# Patient Record
Sex: Female | Born: 1945 | Race: White | Hispanic: No | Smoking: Former smoker
Health system: Southern US, Community
[De-identification: ages and names within clinical notes are randomized; demographics above are authoritative.]

## PROBLEM LIST (undated history)

## (undated) DIAGNOSIS — I803 Phlebitis and thrombophlebitis of lower extremities, unspecified: Secondary | ICD-10-CM

## (undated) DIAGNOSIS — R7303 Prediabetes: Secondary | ICD-10-CM

## (undated) DIAGNOSIS — C50919 Malignant neoplasm of unspecified site of unspecified female breast: Secondary | ICD-10-CM

## (undated) DIAGNOSIS — E119 Type 2 diabetes mellitus without complications: Secondary | ICD-10-CM

## (undated) DIAGNOSIS — F32A Depression, unspecified: Secondary | ICD-10-CM

## (undated) DIAGNOSIS — L570 Actinic keratosis: Secondary | ICD-10-CM

## (undated) DIAGNOSIS — R490 Dysphonia: Secondary | ICD-10-CM

## (undated) DIAGNOSIS — Q892 Congenital malformations of other endocrine glands: Secondary | ICD-10-CM

## (undated) DIAGNOSIS — E785 Hyperlipidemia, unspecified: Secondary | ICD-10-CM

## (undated) DIAGNOSIS — H6692 Otitis media, unspecified, left ear: Secondary | ICD-10-CM

## (undated) DIAGNOSIS — F329 Major depressive disorder, single episode, unspecified: Secondary | ICD-10-CM

## (undated) DIAGNOSIS — I2699 Other pulmonary embolism without acute cor pulmonale: Secondary | ICD-10-CM

## (undated) DIAGNOSIS — K219 Gastro-esophageal reflux disease without esophagitis: Secondary | ICD-10-CM

## (undated) HISTORY — DX: Actinic keratosis: L57.0

## (undated) HISTORY — DX: Hyperlipidemia, unspecified: E78.5

## (undated) HISTORY — DX: Malignant neoplasm of unspecified site of unspecified female breast: C50.919

## (undated) HISTORY — DX: Depression, unspecified: F32.A

## (undated) HISTORY — DX: Dysphonia: R49.0

## (undated) HISTORY — DX: Phlebitis and thrombophlebitis of lower extremities, unspecified: I80.3

## (undated) HISTORY — DX: Major depressive disorder, single episode, unspecified: F32.9

## (undated) HISTORY — DX: Congenital malformations of other endocrine glands: Q89.2

## (undated) HISTORY — DX: Gastro-esophageal reflux disease without esophagitis: K21.9

## (undated) HISTORY — DX: Prediabetes: R73.03

## (undated) HISTORY — DX: Other pulmonary embolism without acute cor pulmonale: I26.99

---
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
## 1994-01-15 HISTORY — PX: CHOLECYSTECTOMY: SHX55

## 1999-01-16 HISTORY — PX: FRACTURE SURGERY: SHX138

## 2001-07-29 LAB — HM COLONOSCOPY: HM Colonoscopy: NORMAL

## 2008-07-29 LAB — HM MAMMOGRAPHY: HM Mammogram: ABNORMAL

## 2009-10-19 ENCOUNTER — Emergency Department: Payer: Self-pay | Admitting: Internal Medicine

## 2011-11-26 DIAGNOSIS — Z23 Encounter for immunization: Secondary | ICD-10-CM | POA: Diagnosis not present

## 2012-04-02 DIAGNOSIS — H43399 Other vitreous opacities, unspecified eye: Secondary | ICD-10-CM | POA: Diagnosis not present

## 2012-07-29 ENCOUNTER — Encounter: Payer: Self-pay | Admitting: Internal Medicine

## 2012-07-29 ENCOUNTER — Ambulatory Visit (INDEPENDENT_AMBULATORY_CARE_PROVIDER_SITE_OTHER): Payer: Medicare Other | Admitting: Internal Medicine

## 2012-07-29 VITALS — BP 118/70 | HR 89 | Temp 98.6°F | Resp 12 | Ht 72.0 in | Wt 245.5 lb

## 2012-07-29 DIAGNOSIS — Z87898 Personal history of other specified conditions: Secondary | ICD-10-CM

## 2012-07-29 DIAGNOSIS — R5381 Other malaise: Secondary | ICD-10-CM | POA: Diagnosis not present

## 2012-07-29 DIAGNOSIS — E559 Vitamin D deficiency, unspecified: Secondary | ICD-10-CM

## 2012-07-29 DIAGNOSIS — Z1239 Encounter for other screening for malignant neoplasm of breast: Secondary | ICD-10-CM

## 2012-07-29 DIAGNOSIS — E785 Hyperlipidemia, unspecified: Secondary | ICD-10-CM | POA: Diagnosis not present

## 2012-07-29 DIAGNOSIS — F4321 Adjustment disorder with depressed mood: Secondary | ICD-10-CM

## 2012-07-29 DIAGNOSIS — E669 Obesity, unspecified: Secondary | ICD-10-CM

## 2012-07-29 DIAGNOSIS — R5383 Other fatigue: Secondary | ICD-10-CM | POA: Diagnosis not present

## 2012-07-29 DIAGNOSIS — Z1211 Encounter for screening for malignant neoplasm of colon: Secondary | ICD-10-CM

## 2012-07-29 DIAGNOSIS — Z1231 Encounter for screening mammogram for malignant neoplasm of breast: Secondary | ICD-10-CM | POA: Diagnosis not present

## 2012-07-29 DIAGNOSIS — Z808 Family history of malignant neoplasm of other organs or systems: Secondary | ICD-10-CM

## 2012-07-29 DIAGNOSIS — K219 Gastro-esophageal reflux disease without esophagitis: Secondary | ICD-10-CM

## 2012-07-29 DIAGNOSIS — F329 Major depressive disorder, single episode, unspecified: Secondary | ICD-10-CM

## 2012-07-29 DIAGNOSIS — Z9189 Other specified personal risk factors, not elsewhere classified: Secondary | ICD-10-CM

## 2012-07-29 MED ORDER — ZOLPIDEM TARTRATE 5 MG PO TABS: 5.0000 mg | ORAL_TABLET | Freq: Every evening | ORAL | Status: DC | PRN

## 2012-07-29 MED ORDER — SERTRALINE HCL 50 MG PO TABS: 50.0000 mg | ORAL_TABLET | Freq: Every day | ORAL | Status: DC

## 2012-07-29 NOTE — Patient Instructions (Addendum)
Trial of ambien to help you rest  Use as needed   return for fasting labs at your leisure

## 2012-07-29 NOTE — Progress Notes (Signed)
Patient ID: Dana Burke, female   DOB: 13-Oct-1945, 67 y.o.   MRN: [id number]   Patient Active Problem List   Diagnosis Date Noted  . Obesity, unspecified 07/31/2012  . History of abnormal mammogram 07/31/2012  . Grief 07/31/2012  . Family history of malignant melanoma 07/31/2012  . GERD (gastroesophageal reflux disease)   . Depression     Subjective:  CC:   Chief Complaint  Patient presents with  . Establish Care    HPI:   Dana Burke is a 67 y.o. female who presents as a new patient to establish primary care with the chief complaint of  Grief.  She is recently widowed. Husband Dana Burke die progressive MS and died at home in Hospice care two weeks ago.  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  He had been bed bound and wheelchair bound most of that time.  She has neglected her own health in the interim.  Obesity :  She is behind on all screenings and  has gained at least 30 lbs. "This is the heaviest I have ever been."   She has started a walking program this week.   History of recurrent DVTS post left Femur fracture years ago,  Retired Surveyor, mining .  GERD controlled with prilosec,  Recent flare  Uses a wedge   Needs mammogram, she has a  History of a right breast density that was followed with prior diagnostic mammograms nad and MRI of the breast.  No biopsy.  Last imagine 3 years ago.Sister had Breast CA Her last PAP smear  Was over 3 years ago as well.  She is requesting a dermatology referral for annual skin checks due to a strong FH of melanoma and basal cell Carcinomas  history of chest pain a few years ago.  Was observed in ED for 6 hours and released after cardiac enzyemes were negative x 2.  Pain was attributed to a Pulled muscle caused by straining to lift husband. No further workup done.     Past Medical History  Diagnosis Date  . Phlebitis and thrombophlebitis of the leg   . Pulmonary embolism   . GERD  (gastroesophageal reflux disease)   . Depression     recent events    Past Surgical History  Procedure Laterality Date  . Appendectomy  1963  . Cholecystectomy  1996  . Fracture surgery  2001    left femur    Family History  Problem Relation Age of Onset  . Heart disease Mother   . Hyperlipidemia Mother   . Heart disease Father   . Hyperlipidemia Father   . Diabetes Father   . Cancer Sister     melanoma  . Cancer Brother     skin cancer  . Cancer Sister     breast    History   Social History  . Marital Status: Widowed    Spouse Name: N/A    Number of Children: N/A  . Years of Education: N/A   Occupational History  . Not on file.   Social History Main Topics  . Smoking status: Former Smoker -- 0.40 packs/day    Types: Cigarettes    Quit date: 07/30/1987  . Smokeless tobacco: Never Used  . Alcohol Use: No  . Drug Use: No  . Sexually Active: No   Other Topics Concern  . Not on file   Social History Narrative  .  No narrative on file       @ALLHX @    Review of Systems:   The remainder of the review of systems was negative except those addressed in the HPI.       Objective:  BP 118/70  Pulse 89  Temp(Src) 98.6 F (37 C) (Oral)  Resp 12  Ht 6' (1.829 m)  Wt 245 lb 8 oz (111.358 kg)  BMI 33.29 kg/m2  SpO2 95%  General appearance: alert, cooperative and appears stated age Ears: normal TM's and external ear canals both ears Throat: lips, mucosa, and tongue normal; teeth and gums normal Neck: no adenopathy, no carotid bruit, supple, symmetrical, trachea midline and thyroid not enlarged, symmetric, no tenderness/mass/nodules Back: symmetric, no curvature. ROM normal. No CVA tenderness. Lungs: clear to auscultation bilaterally Heart: regular rate and rhythm, S1, S2 normal, no murmur, click, rub or gallop Abdomen: soft, non-tender; bowel sounds normal; no masses,  no organomegaly Pulses: 2+ and symmetric Skin: Skin color, texture, turgor  normal. No rashes or lesions Lymph nodes: Cervical, supraclavicular, and axillary nodes normal.  Assessment and Plan:  Depression She would like to resume sertraline  But is having trouble sleeping since th recetn death of husband.  Has tolerated ambien in the past.  Rx given, risks outlilned.   GERD (gastroesophageal reflux disease) Mild, managed with prn use of OTC PPIs.  Reviewed behavioral modifications including avoidance of mint, chocolate and alcohol and overeating.  Advised to remain upright for at least 2 hours after eating.    Obesity, unspecified Secondary to self neglect for several years in caring for dying husband. I have addressed  BMI and recommended a low glycemic index diet utilizing smaller more frequent meals to increase metabolism.  I have also recommended that patient start exercising with a goal of 30 minutes of aerobic exercise a minimum of 5 days per week. Screening for lipid disorders, thyroid and diabetes to be done today.    History of abnormal mammogram Her last mammogram was 3 yrs ago and abnormality on right was being followed serially without prior biopsy.  Sending to [facility]  Breast center.   Grief She is handling the loss of her husband well,  Has an excellent support system.  Hospice grief counselling available.   Family history of malignant melanoma Referral to dermatology for annual surveillance of skin lesions/    Updated Medication List Outpatient Encounter Prescriptions as of 07/29/2012  Medication Sig Dispense Refill  . aspirin 81 MG tablet Take 81 mg by mouth daily.      . calcium carbonate (OS-CAL) 600 MG TABS Take 600 mg by mouth 2 (two) times daily with a meal.      . Cholecalciferol (VITAMIN D-3) 1000 UNITS CAPS Take 1 capsule by mouth daily.      . Ibuprofen-Diphenhydramine HCl (ADVIL PM) 200-25 MG CAPS Take 2 capsules by mouth at bedtime.      . Multiple Vitamins-Minerals (MULTIVITAMIN WITH MINERALS) tablet Take 1 tablet by mouth daily.       [name] omeprazole (PRILOSEC) 20 MG capsule Take 20 mg by mouth daily.      . sertraline (ZOLOFT) 50 MG tablet Take 1 tablet (50 mg total) by mouth daily.  90 tablet  3  . zolpidem (AMBIEN) 5 MG tablet Take 1 tablet (5 mg total) by mouth at bedtime as needed for sleep.  30 tablet  1  . [DISCONTINUED] sertraline (ZOLOFT) 50 MG tablet Take 50 mg by mouth daily.  No facility-administered encounter medications on file as of 07/29/2012.     Orders Placed This Encounter  Procedures  . MM Digital Screening  . HM MAMMOGRAPHY  . CBC with Differential  . Comprehensive metabolic panel  . TSH  . Lipid panel  . Vitamin D 25 hydroxy  . Ambulatory referral to General Surgery  . Ambulatory referral to Dermatology  . HM COLONOSCOPY    No Follow-up on file.

## 2012-07-31 ENCOUNTER — Encounter: Payer: Self-pay | Admitting: Internal Medicine

## 2012-07-31 ENCOUNTER — Encounter: Payer: Self-pay | Admitting: Emergency Medicine

## 2012-07-31 DIAGNOSIS — F329 Major depressive disorder, single episode, unspecified: Secondary | ICD-10-CM | POA: Insufficient documentation

## 2012-07-31 DIAGNOSIS — F4321 Adjustment disorder with depressed mood: Secondary | ICD-10-CM | POA: Insufficient documentation

## 2012-07-31 DIAGNOSIS — Z808 Family history of malignant neoplasm of other organs or systems: Secondary | ICD-10-CM | POA: Insufficient documentation

## 2012-07-31 DIAGNOSIS — Z87898 Personal history of other specified conditions: Secondary | ICD-10-CM | POA: Insufficient documentation

## 2012-07-31 DIAGNOSIS — Z809 Family history of malignant neoplasm, unspecified: Secondary | ICD-10-CM | POA: Insufficient documentation

## 2012-07-31 DIAGNOSIS — K219 Gastro-esophageal reflux disease without esophagitis: Secondary | ICD-10-CM | POA: Insufficient documentation

## 2012-07-31 DIAGNOSIS — E669 Obesity, unspecified: Secondary | ICD-10-CM | POA: Insufficient documentation

## 2012-07-31 NOTE — Assessment & Plan Note (Addendum)
She would like to resume sertraline  But is having trouble sleeping since th recetn death of husband.  Has tolerated ambien in the past.  Rx given, risks outlilned.

## 2012-07-31 NOTE — Assessment & Plan Note (Signed)
Her last mammogram was 3 yrs ago and abnormality on right was being followed serially without prior biopsy.  Sending to [facility]  Breast center.

## 2012-07-31 NOTE — Assessment & Plan Note (Signed)
Referral to dermatology for annual surveillance of skin lesions/

## 2012-07-31 NOTE — Assessment & Plan Note (Signed)
Secondary to self neglect for several years in caring for dying husband. I have addressed  BMI and recommended a low glycemic index diet utilizing smaller more frequent meals to increase metabolism.  I have also recommended that patient start exercising with a goal of 30 minutes of aerobic exercise a minimum of 5 days per week. Screening for lipid disorders, thyroid and diabetes to be done today.

## 2012-07-31 NOTE — Assessment & Plan Note (Signed)
She is handling the loss of her husband well,  Has an excellent support system.  Hospice grief counselling available.

## 2012-07-31 NOTE — Assessment & Plan Note (Addendum)
Mild, managed with prn use of OTC PPIs.  Reviewed behavioral modifications including avoidance of mint, chocolate and alcohol and overeating.  Advised to remain upright for at least 2 hours after eating.

## 2012-08-01 ENCOUNTER — Other Ambulatory Visit (INDEPENDENT_AMBULATORY_CARE_PROVIDER_SITE_OTHER): Payer: Medicare Other

## 2012-08-01 DIAGNOSIS — R5381 Other malaise: Secondary | ICD-10-CM | POA: Diagnosis not present

## 2012-08-01 DIAGNOSIS — E785 Hyperlipidemia, unspecified: Secondary | ICD-10-CM | POA: Diagnosis not present

## 2012-08-01 DIAGNOSIS — R5383 Other fatigue: Secondary | ICD-10-CM | POA: Diagnosis not present

## 2012-08-01 DIAGNOSIS — E559 Vitamin D deficiency, unspecified: Secondary | ICD-10-CM | POA: Diagnosis not present

## 2012-08-01 LAB — COMPREHENSIVE METABOLIC PANEL
ALT: 19 U/L (ref 0–35)
AST: 18 U/L (ref 0–37)
Calcium: 9.9 mg/dL (ref 8.4–10.5)
Chloride: 107 mEq/L (ref 96–112)
Creatinine, Ser: 0.7 mg/dL (ref 0.4–1.2)
Potassium: 4.4 mEq/L (ref 3.5–5.1)

## 2012-08-01 LAB — CBC WITH DIFFERENTIAL/PLATELET
Basophils Absolute: 0 10*3/uL (ref 0.0–0.1)
Eosinophils Absolute: 0.1 10*3/uL (ref 0.0–0.7)
Lymphocytes Relative: 33.5 % (ref 12.0–46.0)
MCHC: 34.1 g/dL (ref 30.0–36.0)
Neutrophils Relative %: 58.8 % (ref 43.0–77.0)
RDW: 13.3 % (ref 11.5–14.6)

## 2012-08-01 LAB — LIPID PANEL
Cholesterol: 175 mg/dL (ref 0–200)
HDL: 34.2 mg/dL — ABNORMAL LOW (ref >39.00)
Total CHOL/HDL Ratio: 5
Triglycerides: 81 mg/dL (ref 0.0–149.0)

## 2012-08-04 ENCOUNTER — Encounter: Payer: Self-pay | Admitting: *Deleted

## 2012-08-07 ENCOUNTER — Encounter: Payer: Self-pay | Admitting: Emergency Medicine

## 2012-08-07 ENCOUNTER — Encounter: Payer: Self-pay | Admitting: *Deleted

## 2012-08-11 DIAGNOSIS — Z1231 Encounter for screening mammogram for malignant neoplasm of breast: Secondary | ICD-10-CM | POA: Diagnosis not present

## 2012-08-13 ENCOUNTER — Telehealth: Payer: Self-pay | Admitting: Internal Medicine

## 2012-08-13 NOTE — Telephone Encounter (Signed)
Left voicemail for patient to return call to office. 

## 2012-08-13 NOTE — Telephone Encounter (Signed)
Her mammogram was normal appearing but radiologist is withholding final reading until he can obtain her prior studies from [facility].

## 2012-08-15 NOTE — Telephone Encounter (Signed)
Patient notified

## 2012-08-18 ENCOUNTER — Encounter: Payer: Self-pay | Admitting: Internal Medicine

## 2012-09-04 ENCOUNTER — Encounter: Payer: Self-pay | Admitting: General Surgery

## 2012-09-04 ENCOUNTER — Ambulatory Visit (INDEPENDENT_AMBULATORY_CARE_PROVIDER_SITE_OTHER): Payer: Medicare Other | Admitting: General Surgery

## 2012-09-04 VITALS — BP 130/74 | HR 82 | Resp 16 | Ht 72.0 in | Wt 247.0 lb

## 2012-09-04 DIAGNOSIS — Z1211 Encounter for screening for malignant neoplasm of colon: Secondary | ICD-10-CM

## 2012-09-04 DIAGNOSIS — K219 Gastro-esophageal reflux disease without esophagitis: Secondary | ICD-10-CM | POA: Diagnosis not present

## 2012-09-04 MED ORDER — POLYETHYLENE GLYCOL 3350 17 GM/SCOOP PO POWD: ORAL | Status: DC

## 2012-09-04 NOTE — Progress Notes (Signed)
Patient ID: Dana Burke, female   DOB: 1945-05-14, 67 y.o.   MRN: [id number]  Chief Complaint  Patient presents with  . Other    evaluation for screening colonoscopy    HPI Dana Burke is a 67 y.o. female who presents for an evaluation of a screening colonoscopy. The patient has had one colonoscopy in the past approximately 12 years ago. She believes that colonoscopy was normal with possible diverticulitis. She denies any problems with her bowels at this time. She has a family history of a paternal uncle who passed away with colon cancer.  The patient has been making use of omeprazole for a long period of time. She notices significant recurrent symptoms if she misses a dose. No dysphasia. No weight loss.  HPI  Past Medical History  Diagnosis Date  . Phlebitis and thrombophlebitis of the leg   . Pulmonary embolism   . GERD (gastroesophageal reflux disease)   . Depression     recent events    Past Surgical History  Procedure Laterality Date  . Appendectomy  1963  . Cholecystectomy  1996  . Fracture surgery  2001    left tibia    Family History  Problem Relation Age of Onset  . Heart disease Mother   . Hyperlipidemia Mother   . Heart disease Father   . Hyperlipidemia Father   . Diabetes Father   . Cancer Sister     melanoma  . Cancer Brother     skin cancer  . Cancer Sister     breast  . Cancer Paternal Uncle     colon    Social History History  Substance Use Topics  . Smoking status: Former Smoker -- 0.40 packs/day    Types: Cigarettes    Quit date: 07/30/1987  . Smokeless tobacco: Never Used  . Alcohol Use: No    No Known Allergies  Current Outpatient Prescriptions  Medication Sig Dispense Refill  . aspirin 81 MG tablet Take 81 mg by mouth daily.      . calcium carbonate (OS-CAL) 600 MG TABS Take 600 mg by mouth 2 (two) times daily with a meal.      . Cholecalciferol (VITAMIN D-3) 1000 UNITS CAPS Take 1 capsule by mouth daily.      .  Ibuprofen-Diphenhydramine HCl (ADVIL PM) 200-25 MG CAPS Take 2 capsules by mouth at bedtime.      . Multiple Vitamins-Minerals (MULTIVITAMIN WITH MINERALS) tablet Take 1 tablet by mouth daily.      [name] omeprazole (PRILOSEC) 20 MG capsule Take 20 mg by mouth daily.      . sertraline (ZOLOFT) 50 MG tablet Take 1 tablet (50 mg total) by mouth daily.  90 tablet  3  . zolpidem (AMBIEN) 5 MG tablet Take 1 tablet (5 mg total) by mouth at bedtime as needed for sleep.  30 tablet  1  . polyethylene glycol powder (GLYCOLAX/MIRALAX) powder 255 grams one bottle for colonoscopy prep  255 g  0   No current facility-administered medications for this visit.    Review of Systems Review of Systems  Constitutional: Negative.   Respiratory: Negative.   Cardiovascular: Negative.   Gastrointestinal: Negative.     Blood pressure 130/74, pulse 82, resp. rate 16, height 6' (1.829 m), weight 247 lb (112.038 kg).  Physical Exam Physical Exam  Constitutional: She is oriented to person, place, and time. She appears well-developed and well-nourished.  Neck: No thyromegaly present.  Cardiovascular: Normal rate, regular rhythm and normal heart sounds.  No murmur heard. Pulmonary/Chest: Effort normal and breath sounds normal.  Lymphadenopathy:    She has no cervical adenopathy.  Neurological: She is alert and oriented to person, place, and time.  Skin: Skin is warm and dry.    Data Reviewed EMR data.  Assessment    Candidate for upper and lower endoscopy.     Plan    Upper endoscopy recommended because of her long history of reflux and the possibility of Barrett's epiphyseal change. Colonoscopy is recommended as a screening procedure.     Patient has been scheduled for a upper endoscopy and colonoscopy on 10-15-12 at [facility].   Earline Mayotte 09/05/2012, 8:28 PM

## 2012-09-04 NOTE — Patient Instructions (Addendum)
Colonoscopy A colonoscopy is an exam to evaluate your entire colon. In this exam, your colon is cleansed. A long fiberoptic tube is inserted through your rectum and into your colon. The fiberoptic scope (endoscope) is a long bundle of enclosed and very flexible fibers. These fibers transmit light to the area examined and send images from that area to your caregiver. Discomfort is usually minimal. You may be given a drug to help you sleep (sedative) during or prior to the procedure. This exam helps to detect lumps (tumors), polyps, inflammation, and areas of bleeding. Your caregiver may also take a small piece of tissue (biopsy) that will be examined under a microscope. LET YOUR CAREGIVER KNOW ABOUT:   Allergies to food or medicine.  Medicines taken, including vitamins, herbs, eyedrops, over-the-counter medicines, and creams.  Use of steroids (by mouth or creams).  Previous problems with anesthetics or numbing medicines.  History of bleeding problems or blood clots.  Previous surgery.  Other health problems, including diabetes and kidney problems.  Possibility of pregnancy, if this applies. BEFORE THE PROCEDURE   A clear liquid diet may be required for 2 days before the exam.  Ask your caregiver about changing or stopping your regular medications.  Liquid injections (enemas) or laxatives may be required.  A large amount of electrolyte solution may be given to you to drink over a short period of time. This solution is used to clean out your colon.  You should be present 60 minutes prior to your procedure or as directed by your caregiver. AFTER THE PROCEDURE   If you received a sedative or pain relieving medication, you will need to arrange for someone to drive you home.  Occasionally, there is a little blood passed with the first bowel movement. Do not be concerned. FINDING OUT THE RESULTS OF YOUR TEST Not all test results are available during your visit. If your test results are  not back during the visit, make an appointment with your caregiver to find out the results. Do not assume everything is normal if you have not heard from your caregiver or the medical facility. It is important for you to follow up on all of your test results. HOME CARE INSTRUCTIONS   It is not unusual to pass moderate amounts of gas and experience mild abdominal cramping following the procedure. This is due to air being used to inflate your colon during the exam. Walking or a warm pack on your belly (abdomen) may help.  You may resume all normal meals and activities after sedatives and medicines have worn off.  Only take over-the-counter or prescription medicines for pain, discomfort, or fever as directed by your caregiver. Do not use aspirin or blood thinners if a biopsy was taken. Consult your caregiver for medicine usage if biopsies were taken. SEEK IMMEDIATE MEDICAL CARE IF:   You have a fever.  You pass large blood clots or fill a toilet with blood following the procedure. This may also occur 10 to 14 days following the procedure. This is more likely if a biopsy was taken.  You develop abdominal pain that keeps getting worse and cannot be relieved with medicine. Document Released: 12/30/1999 Document Revised: 03/26/2011 Document Reviewed: 08/14/2007 [facility] Patient Information 2014 [place], [place].  Upper GI Endoscopy Upper GI endoscopy means using a flexible scope to look at the esophagus, stomach, and upper small bowel. This is done to make a diagnosis in people with heartburn, abdominal pain, or abnormal bleeding. Sometimes an endoscope is needed to remove  foreign bodies or food that become stuck in the esophagus; it can also be used to take biopsy samples. For the best results, do not eat or drink for 8 hours before having your upper endoscopy.  To perform the endoscopy, you will probably be sedated and your throat will be numbed with a special spray. The endoscope is then slowly passed  down your throat (this will not interfere with your breathing). An endoscopy exam takes 15 to 30 minutes to complete and there is no real pain. Patients rarely remember much about the procedure. The results of the test may take several days if a biopsy or other test is taken.  You may have a sore throat after an endoscopy exam. Serious complications are very rare. Stick to liquids and soft foods until your pain is better. Do not drive a car or operate any dangerous equipment for at least 24 hours after being sedated. SEEK IMMEDIATE MEDICAL CARE IF:   You have severe throat pain.   You have shortness of breath.   You have bleeding problems.   You have a fever.   You have difficulty recovering from your sedation.  Document Released: 02/09/2004 Document Revised: 12/21/2010 Document Reviewed: 01/04/2008 [facility] Patient Information 2012 [place], [place].  Patient has been scheduled for a colonoscopy on 10-15-12 at [facility].

## 2012-09-05 ENCOUNTER — Other Ambulatory Visit: Payer: Self-pay | Admitting: General Surgery

## 2012-09-05 ENCOUNTER — Encounter: Payer: Self-pay | Admitting: General Surgery

## 2012-09-05 DIAGNOSIS — K219 Gastro-esophageal reflux disease without esophagitis: Secondary | ICD-10-CM

## 2012-09-05 DIAGNOSIS — Z1211 Encounter for screening for malignant neoplasm of colon: Secondary | ICD-10-CM

## 2012-09-10 ENCOUNTER — Other Ambulatory Visit: Payer: Self-pay | Admitting: *Deleted

## 2012-09-10 MED ORDER — ZOLPIDEM TARTRATE 5 MG PO TABS: 5.0000 mg | ORAL_TABLET | Freq: Every evening | ORAL | Status: DC | PRN

## 2012-09-19 DIAGNOSIS — B359 Dermatophytosis, unspecified: Secondary | ICD-10-CM | POA: Diagnosis not present

## 2012-09-19 DIAGNOSIS — L820 Inflamed seborrheic keratosis: Secondary | ICD-10-CM | POA: Diagnosis not present

## 2012-09-19 DIAGNOSIS — L821 Other seborrheic keratosis: Secondary | ICD-10-CM | POA: Diagnosis not present

## 2012-09-19 DIAGNOSIS — D1800 Hemangioma unspecified site: Secondary | ICD-10-CM | POA: Diagnosis not present

## 2012-10-08 ENCOUNTER — Telehealth: Payer: Self-pay | Admitting: *Deleted

## 2012-10-08 NOTE — Telephone Encounter (Signed)
Message for patient to call the office.  He is scheduled for a colonoscopy on 10-15-12 at [facility].

## 2012-10-09 ENCOUNTER — Telehealth: Payer: Self-pay | Admitting: *Deleted

## 2012-10-09 NOTE — Telephone Encounter (Signed)
Patient called the office back to report no medication changes since last office visit. We will proceed with colonoscopy that is scheduled for [facility] on 10-15-12. Patient instructed to pre-register by Friday (she has been playing phone tag with the folks at the hospital).  She will call the office if she has further questions.

## 2012-10-15 ENCOUNTER — Ambulatory Visit: Payer: Self-pay | Admitting: General Surgery

## 2012-10-15 DIAGNOSIS — Z86718 Personal history of other venous thrombosis and embolism: Secondary | ICD-10-CM | POA: Diagnosis not present

## 2012-10-15 DIAGNOSIS — Z1211 Encounter for screening for malignant neoplasm of colon: Secondary | ICD-10-CM | POA: Diagnosis not present

## 2012-10-15 DIAGNOSIS — D129 Benign neoplasm of anus and anal canal: Secondary | ICD-10-CM

## 2012-10-15 DIAGNOSIS — K219 Gastro-esophageal reflux disease without esophagitis: Secondary | ICD-10-CM | POA: Diagnosis not present

## 2012-10-15 DIAGNOSIS — Z87891 Personal history of nicotine dependence: Secondary | ICD-10-CM | POA: Diagnosis not present

## 2012-10-15 DIAGNOSIS — K620 Anal polyp: Secondary | ICD-10-CM | POA: Diagnosis not present

## 2012-10-15 DIAGNOSIS — E669 Obesity, unspecified: Secondary | ICD-10-CM | POA: Diagnosis not present

## 2012-10-15 DIAGNOSIS — D131 Benign neoplasm of stomach: Secondary | ICD-10-CM | POA: Diagnosis not present

## 2012-10-15 DIAGNOSIS — Z79899 Other long term (current) drug therapy: Secondary | ICD-10-CM | POA: Diagnosis not present

## 2012-10-15 DIAGNOSIS — F329 Major depressive disorder, single episode, unspecified: Secondary | ICD-10-CM | POA: Diagnosis not present

## 2012-10-15 DIAGNOSIS — Z808 Family history of malignant neoplasm of other organs or systems: Secondary | ICD-10-CM | POA: Diagnosis not present

## 2012-10-15 DIAGNOSIS — K5730 Diverticulosis of large intestine without perforation or abscess without bleeding: Secondary | ICD-10-CM | POA: Diagnosis not present

## 2012-10-15 DIAGNOSIS — Z803 Family history of malignant neoplasm of breast: Secondary | ICD-10-CM | POA: Diagnosis not present

## 2012-10-15 DIAGNOSIS — K449 Diaphragmatic hernia without obstruction or gangrene: Secondary | ICD-10-CM | POA: Diagnosis not present

## 2012-10-15 DIAGNOSIS — K210 Gastro-esophageal reflux disease with esophagitis: Secondary | ICD-10-CM

## 2012-10-15 DIAGNOSIS — D128 Benign neoplasm of rectum: Secondary | ICD-10-CM

## 2012-10-15 DIAGNOSIS — D126 Benign neoplasm of colon, unspecified: Secondary | ICD-10-CM | POA: Diagnosis not present

## 2012-10-15 DIAGNOSIS — Z7982 Long term (current) use of aspirin: Secondary | ICD-10-CM | POA: Diagnosis not present

## 2012-10-15 DIAGNOSIS — Z833 Family history of diabetes mellitus: Secondary | ICD-10-CM | POA: Diagnosis not present

## 2012-10-15 DIAGNOSIS — Z800 Family history of malignant neoplasm of digestive organs: Secondary | ICD-10-CM | POA: Diagnosis not present

## 2012-10-16 ENCOUNTER — Encounter: Payer: Self-pay | Admitting: General Surgery

## 2012-10-17 ENCOUNTER — Telehealth: Payer: Self-pay | Admitting: *Deleted

## 2012-10-17 NOTE — Telephone Encounter (Signed)
Message copied by Levada Schilling on Fri Oct 17, 2012  9:16 AM ------      Message from: [place], [place] W      Created: Fri Oct 17, 2012  8:56 AM       Notify the patient that all of the biopsies (stomach and colon) obtained on Oct 1 were fine.  She is off the hook for 10 years or 100,000 miles, whichever comes first.  ------

## 2012-10-17 NOTE — Telephone Encounter (Signed)
Notified patient as instructed, patient pleased. Discussed follow-up appointments,put in recalls for 10 years. patient agrees

## 2012-10-28 ENCOUNTER — Encounter: Payer: Self-pay | Admitting: *Deleted

## 2012-10-29 ENCOUNTER — Encounter: Payer: Self-pay | Admitting: Internal Medicine

## 2012-10-29 ENCOUNTER — Ambulatory Visit (INDEPENDENT_AMBULATORY_CARE_PROVIDER_SITE_OTHER): Payer: Medicare Other | Admitting: Internal Medicine

## 2012-10-29 ENCOUNTER — Ambulatory Visit (INDEPENDENT_AMBULATORY_CARE_PROVIDER_SITE_OTHER)
Admission: RE | Admit: 2012-10-29 | Discharge: 2012-10-29 | Disposition: A | Discharge status: Home / Self Care | Payer: Medicare Other | Source: Ambulatory Visit | Attending: Internal Medicine | Admitting: Internal Medicine

## 2012-10-29 VITALS — BP 118/72 | HR 71 | Temp 98.1°F | Resp 12 | Wt 248.5 lb

## 2012-10-29 DIAGNOSIS — M25561 Pain in right knee: Secondary | ICD-10-CM

## 2012-10-29 DIAGNOSIS — Z79899 Other long term (current) drug therapy: Secondary | ICD-10-CM | POA: Diagnosis not present

## 2012-10-29 DIAGNOSIS — E669 Obesity, unspecified: Secondary | ICD-10-CM

## 2012-10-29 DIAGNOSIS — Z87898 Personal history of other specified conditions: Secondary | ICD-10-CM

## 2012-10-29 DIAGNOSIS — M25569 Pain in unspecified knee: Secondary | ICD-10-CM

## 2012-10-29 DIAGNOSIS — Z9189 Other specified personal risk factors, not elsewhere classified: Secondary | ICD-10-CM

## 2012-10-29 DIAGNOSIS — Z23 Encounter for immunization: Secondary | ICD-10-CM

## 2012-10-29 DIAGNOSIS — F4321 Adjustment disorder with depressed mood: Secondary | ICD-10-CM

## 2012-10-29 MED ORDER — TETANUS-DIPHTH-ACELL PERTUSSIS 5-2.5-18.5 LF-MCG/0.5 IM SUSP: 0.5000 mL | Freq: Once | INTRAMUSCULAR | Status: DC

## 2012-10-29 MED ORDER — ZOSTER VACCINE LIVE 19400 UNT/0.65ML ~~LOC~~ SOLR: 0.6500 mL | Freq: Once | SUBCUTANEOUS | Status: DC

## 2012-10-29 NOTE — Patient Instructions (Signed)

## 2012-10-29 NOTE — Progress Notes (Signed)
Patient ID: Dana Burke, female   DOB: 03-26-1945, 67 y.o.   MRN: [id number]   Patient Active Problem List   Diagnosis Date Noted  . Special screening for malignant neoplasms, colon 09/04/2012  . Obesity, unspecified 07/31/2012  . History of abnormal mammogram 07/31/2012  . Grief 07/31/2012  . Family history of malignant melanoma 07/31/2012  . GERD (gastroesophageal reflux disease)   . Depression     Subjective:  CC:   Chief Complaint  Patient presents with  . Follow-up    3 month followup    HPI:   Dana Pfohlis a 67 y.o. female who presents Follow up on multiple issues  1) grief and insomnia which started several months ago after the death of her husband. 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  She has been uUsing the ambien prn,  Having a difficult time due to stress of moving on, stress of selling her house since her mother has left her an estate.    2)  Obesity.  Walking a mile daily most days,   Going to [employer]. No significant weight loss yet. Pain in left leg hindering her somehwat   3) Left leg pain has been hurting more recently.  She notes deep aching pain occuring  once or twice a month, without swellign or redness.  She has a history of a traumatic fracture requiring rod placement which was done at [facility] in 2001. 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    Past Medical History  Diagnosis Date  . Phlebitis and thrombophlebitis of the leg   . Pulmonary embolism   . GERD (gastroesophageal reflux disease)   . Depression     recent events    Past Surgical History  Procedure Laterality Date  . Appendectomy  1963  . Cholecystectomy  1996  . Fracture surgery  2001    left tibia       The following portions of the patient's history were reviewed and updated as appropriate: Allergies, current medications, and problem list.    Review of Systems:  Patient  denies headache, fevers, malaise, unintentional weight loss, skin rash, eye pain, sinus congestion and sinus pain, sore throat, dysphagia,  hemoptysis , cough, dyspnea, wheezing, chest pain, palpitations, orthopnea, edema, abdominal pain, nausea, melena, diarrhea, constipation, flank pain, dysuria, hematuria, urinary  Frequency, nocturia, numbness, tingling, seizures,  Focal weakness, Loss of consciousness,  Tremor, insomnia, depression, anxiety, and suicidal ideation.     History   Social History  . Marital Status: Widowed    Spouse Name: N/A    Number of Children: N/A  . Years of Education: N/A   Occupational History  . Not on file.   Social History Main Topics  . Smoking status: Former Smoker -- 0.40 packs/day    Types: Cigarettes    Quit date: 07/30/1987  . Smokeless tobacco: Never Used  . Alcohol Use: No  . Drug Use: No  . Sexual Activity: No   Other Topics Concern  . Not on file   Social History Narrative  . No narrative on file    Objective:  Filed Vitals:   10/29/12 0904  BP: 118/72  Pulse: 71  Temp: 98.1 F (36.7 C)  Resp: 12     General appearance: alert, cooperative and appears stated age Ears: normal TM's and external ear canals both ears Throat: lips, mucosa, and tongue normal; teeth and gums normal  Neck: no adenopathy, no carotid bruit, supple, symmetrical, trachea midline and thyroid not enlarged, symmetric, no tenderness/mass/nodules Back: symmetric, no curvature. ROM normal. No CVA tenderness. Lungs: clear to auscultation bilaterally Heart: regular rate and rhythm, S1, S2 normal, no murmur, click, rub or gallop Abdomen: soft, non-tender; bowel sounds normal; no masses,  no organomegaly Pulses: 2+ and symmetric Skin: Skin color, texture, turgor normal. No rashes or lesions Lymph nodes: Cervical, supraclavicular, and axillary nodes normal. Psych:  Smiles easily,  Makes good eye contact.  Normal affect   Assessment and Plan:  Obesity,  unspecified Spent 15 minutes addressing current efforts at lowering her   BMI and recommended wt loss of 10% of body weigh over the next 6 months using a low glycemic index diet and regular exercise a minimum of 5 days per week.    Grief Not clinically depressed,  Main symptom is insomnia.  Continue  prn Ambien.   History of abnormal mammogram Received notice from facility that mammogram was compared to priors and normal. Awaiting my copy.   A total of 25 minutes of face to face time was spent with patient more than half of which was spent in counselling and coordination of care   Updated Medication List Outpatient Encounter Prescriptions as of 10/29/2012  Medication Sig Dispense Refill  . aspirin 81 MG tablet Take 81 mg by mouth daily.      . calcium carbonate (OS-CAL) 600 MG TABS Take 600 mg by mouth 2 (two) times daily with a meal.      . Cholecalciferol (VITAMIN D-3) 1000 UNITS CAPS Take 1 capsule by mouth daily.      . Ibuprofen-Diphenhydramine HCl (ADVIL PM) 200-25 MG CAPS Take 2 capsules by mouth at bedtime.      . Multiple Vitamins-Minerals (MULTIVITAMIN WITH MINERALS) tablet Take 1 tablet by mouth daily.      [name] omeprazole (PRILOSEC) 20 MG capsule Take 20 mg by mouth daily.      . sertraline (ZOLOFT) 50 MG tablet Take 1 tablet (50 mg total) by mouth daily.  90 tablet  3  . zolpidem (AMBIEN) 5 MG tablet Take 1 tablet (5 mg total) by mouth at bedtime as needed for sleep.  30 tablet  5  . TDaP (BOOSTRIX) 5-2.5-18.5 LF-MCG/0.5 injection Inject 0.5 mLs into the muscle once.  0.5 mL  0  . zoster vaccine live, PF, (ZOSTAVAX) 16109 UNT/0.65ML injection Inject 19,400 Units into the skin once.  1 each  0  . [DISCONTINUED] polyethylene glycol powder (GLYCOLAX/MIRALAX) powder 255 grams one bottle for colonoscopy prep  255 g  0   No facility-administered encounter medications on file as of 10/29/2012.     Orders Placed This Encounter  Procedures  . DG Tibia/Fibula Left  . Flu vaccine  HIGH DOSE PF (Fluzone Tri High dose)  . Pneumococcal conjugate vaccine 13-valent less than 5yo IM  . Sedimentation rate  . C-reactive protein    No Follow-up on file.

## 2012-10-30 ENCOUNTER — Encounter: Payer: Self-pay | Admitting: Internal Medicine

## 2012-10-30 NOTE — Assessment & Plan Note (Signed)
Received notice from facility that mammogram was compared to priors and normal. Awaiting my copy.

## 2012-10-30 NOTE — Assessment & Plan Note (Signed)
Not clinically depressed,  Main symptom is insomnia.  Continue  prn Ambien.

## 2012-10-30 NOTE — Assessment & Plan Note (Signed)
Spent 15 minutes addressing current efforts at lowering her   BMI and recommended wt loss of 10% of body weigh over the next 6 months using a low glycemic index diet and regular exercise a minimum of 5 days per week.

## 2012-11-17 ENCOUNTER — Encounter: Payer: Self-pay | Admitting: Internal Medicine

## 2013-01-30 DIAGNOSIS — L909 Atrophic disorder of skin, unspecified: Secondary | ICD-10-CM | POA: Diagnosis not present

## 2013-01-30 DIAGNOSIS — L919 Hypertrophic disorder of the skin, unspecified: Secondary | ICD-10-CM | POA: Diagnosis not present

## 2013-01-30 DIAGNOSIS — L820 Inflamed seborrheic keratosis: Secondary | ICD-10-CM | POA: Diagnosis not present

## 2013-01-30 DIAGNOSIS — L821 Other seborrheic keratosis: Secondary | ICD-10-CM | POA: Diagnosis not present

## 2013-05-01 ENCOUNTER — Other Ambulatory Visit (HOSPITAL_COMMUNITY)
Admission: RE | Admit: 2013-05-01 | Discharge: 2013-05-01 | Disposition: A | Discharge status: Home / Self Care | Payer: Medicare Other | Source: Ambulatory Visit | Attending: Internal Medicine | Admitting: Internal Medicine

## 2013-05-01 ENCOUNTER — Encounter: Payer: Self-pay | Admitting: Internal Medicine

## 2013-05-01 ENCOUNTER — Ambulatory Visit (INDEPENDENT_AMBULATORY_CARE_PROVIDER_SITE_OTHER): Payer: Medicare Other | Admitting: Internal Medicine

## 2013-05-01 VITALS — BP 114/78 | HR 80 | Temp 97.8°F | Resp 18 | Ht 70.0 in | Wt 256.2 lb

## 2013-05-01 DIAGNOSIS — E785 Hyperlipidemia, unspecified: Secondary | ICD-10-CM | POA: Diagnosis not present

## 2013-05-01 DIAGNOSIS — E559 Vitamin D deficiency, unspecified: Secondary | ICD-10-CM | POA: Diagnosis not present

## 2013-05-01 DIAGNOSIS — Z0000 Encounter for general adult medical examination without abnormal findings: Secondary | ICD-10-CM

## 2013-05-01 DIAGNOSIS — Z1151 Encounter for screening for human papillomavirus (HPV): Secondary | ICD-10-CM | POA: Insufficient documentation

## 2013-05-01 DIAGNOSIS — Z01419 Encounter for gynecological examination (general) (routine) without abnormal findings: Secondary | ICD-10-CM | POA: Diagnosis not present

## 2013-05-01 DIAGNOSIS — R5381 Other malaise: Secondary | ICD-10-CM | POA: Diagnosis not present

## 2013-05-01 DIAGNOSIS — Z124 Encounter for screening for malignant neoplasm of cervix: Secondary | ICD-10-CM | POA: Diagnosis not present

## 2013-05-01 DIAGNOSIS — Z1239 Encounter for other screening for malignant neoplasm of breast: Secondary | ICD-10-CM | POA: Diagnosis not present

## 2013-05-01 DIAGNOSIS — E669 Obesity, unspecified: Secondary | ICD-10-CM | POA: Diagnosis not present

## 2013-05-01 DIAGNOSIS — R5383 Other fatigue: Secondary | ICD-10-CM | POA: Diagnosis not present

## 2013-05-01 DIAGNOSIS — Z79899 Other long term (current) drug therapy: Secondary | ICD-10-CM

## 2013-05-01 LAB — COMPREHENSIVE METABOLIC PANEL
ALK PHOS: 77 U/L (ref 39–117)
ALT: 21 U/L (ref 0–35)
AST: 20 U/L (ref 0–37)
Albumin: 4.1 g/dL (ref 3.5–5.2)
BUN: 15 mg/dL (ref 6–23)
CO2: 29 mEq/L (ref 19–32)
Calcium: 10.4 mg/dL (ref 8.4–10.5)
Chloride: 104 mEq/L (ref 96–112)
Creatinine, Ser: 0.7 mg/dL (ref 0.4–1.2)
GFR: 86.48 mL/min (ref >60.00)
Glucose, Bld: 92 mg/dL (ref 70–99)
POTASSIUM: 4.6 meq/L (ref 3.5–5.1)
SODIUM: 140 meq/L (ref 135–145)
TOTAL PROTEIN: 7.3 g/dL (ref 6.0–8.3)
Total Bilirubin: 0.7 mg/dL (ref 0.3–1.2)

## 2013-05-01 LAB — CBC WITH DIFFERENTIAL/PLATELET
Basophils Absolute: 0 10*3/uL (ref 0.0–0.1)
Basophils Relative: 0.5 % (ref 0.0–3.0)
EOS ABS: 0.2 10*3/uL (ref 0.0–0.7)
Eosinophils Relative: 3.3 % (ref 0.0–5.0)
HEMATOCRIT: 42.3 % (ref 36.0–46.0)
Hemoglobin: 14.4 g/dL (ref 12.0–15.0)
Lymphocytes Relative: 33.9 % (ref 12.0–46.0)
Lymphs Abs: 2.1 10*3/uL (ref 0.7–4.0)
MCHC: 34 g/dL (ref 30.0–36.0)
MCV: 90.7 fl (ref 78.0–100.0)
Monocytes Absolute: 0.4 10*3/uL (ref 0.1–1.0)
Monocytes Relative: 6.6 % (ref 3.0–12.0)
Neutro Abs: 3.4 10*3/uL (ref 1.4–7.7)
Neutrophils Relative %: 55.7 % (ref 43.0–77.0)
Platelets: 227 10*3/uL (ref 150.0–400.0)
RBC: 4.67 Mil/uL (ref 3.87–5.11)
RDW: 12.9 % (ref 11.5–14.6)
WBC: 6.1 10*3/uL (ref 4.5–10.5)

## 2013-05-01 LAB — LIPID PANEL
CHOLESTEROL: 208 mg/dL — AB (ref 0–200)
HDL: 35.4 mg/dL — AB (ref >39.00)
LDL CALC: 149 mg/dL — AB (ref 0–99)
TRIGLYCERIDES: 117 mg/dL (ref 0.0–149.0)
Total CHOL/HDL Ratio: 6
VLDL: 23.4 mg/dL (ref 0.0–40.0)

## 2013-05-01 LAB — TSH: TSH: 0.83 u[IU]/mL (ref 0.35–5.50)

## 2013-05-01 MED ORDER — TETANUS-DIPHTH-ACELL PERTUSSIS 5-2.5-18.5 LF-MCG/0.5 IM SUSP: 0.5000 mL | Freq: Once | INTRAMUSCULAR | Status: DC

## 2013-05-01 NOTE — Patient Instructions (Signed)
You had your annual Medicare wellness exam today  We will schedule your mammogram at [place] for the 3 D testing in July.   We will contact you with the bloodwork results

## 2013-05-01 NOTE — Progress Notes (Signed)
Pre-visit discussion using our clinic review tool. No additional management support is needed unless otherwise documented below in the visit note.  

## 2013-05-02 LAB — VITAMIN D 25 HYDROXY (VIT D DEFICIENCY, FRACTURES): Vit D, 25-Hydroxy: 41 ng/mL (ref 30–89)

## 2013-05-03 ENCOUNTER — Encounter: Payer: Self-pay | Admitting: Internal Medicine

## 2013-05-03 DIAGNOSIS — Z0000 Encounter for general adult medical examination without abnormal findings: Secondary | ICD-10-CM | POA: Insufficient documentation

## 2013-05-03 NOTE — Assessment & Plan Note (Signed)
Annual comprehensive exam was done including breast, pelvic and PAP smear. All screenings have been addressed .  

## 2013-05-03 NOTE — Assessment & Plan Note (Signed)
I have addressed her weight gain , current  BMI and recommended a low glycemic index diet utilizing smaller more frequent meals to increase metabolism.  I have also recommended that patient start exercising with a goal of 30 minutes of aerobic exercise a minimum of 5 days per week. Screening for lipid disorders, thyroid and diabetes to be done today.

## 2013-05-03 NOTE — Progress Notes (Signed)
Patient ID: Dana Burke, female   DOB: 18-Apr-1945, 68 y.o.   MRN: [id number]  The patient is here for annual Medicare wellness examination and management of other chronic and acute problems.   The risk factors are reflected in the social history.  The roster of all physicians providing medical care to patient - is listed in the Snapshot section of the chart.  Activities of daily living:  The patient is 100% independent in all ADLs: dressing, toileting, feeding as well as independent mobility  Home safety : The patient has smoke detectors in the home. They wear seatbelts.  There are no firearms at home. There is no violence in the home.   There is no risks for hepatitis, STDs or HIV. There is no   history of blood transfusion. They have no travel history to infectious disease endemic areas of the world.  The patient has seen their dentist in the last six month. They have seen their eye doctor in the last year. They admit to slight hearing difficulty with regard to whispered voices and some television programs.  They have deferred audiologic testing in the last year.  They do not  have excessive sun exposure. Discussed the need for sun protection: hats, long sleeves and use of sunscreen if there is significant sun exposure.   Diet: the importance of a healthy diet is discussed. They do have a healthy diet.  The benefits of regular aerobic exercise were discussed. She walks 4 times per week ,  20 minutes.   Depression screen: there are no signs or vegative symptoms of depression- irritability, change in appetite, anhedonia, sadness/tearfullness.  Cognitive assessment: the patient manages all their financial and personal affairs and is actively engaged. They could relate day,date,year and events; recalled 2/3 objects at 3 minutes; performed clock-face test normally.  The following portions of the patient's history were reviewed and updated as appropriate: allergies, current medications, past  family history, past medical history,  past surgical history, past social history  and problem list.  Visual acuity was not assessed per patient preference since she has regular follow up with her ophthalmologist. Hearing and body mass index were assessed and reviewed.   During the course of the visit the patient was educated and counseled about appropriate screening and preventive services including : fall prevention , diabetes screening, nutrition counseling, colorectal cancer screening, and recommended immunizations.    Objective:    General Appearance:    Alert, cooperative, no distress, appears stated age  Head:    Normocephalic, without obvious abnormality, atraumatic  Eyes:    PERRL, conjunctiva/corneas clear, EOM's intact, fundi    benign, both eyes  Ears:    Normal TM's and external ear canals, both ears  Nose:   Nares normal, septum midline, mucosa normal, no drainage    or sinus tenderness  Throat:   Lips, mucosa, and tongue normal; teeth and gums normal  Neck:   Supple, symmetrical, trachea midline, no adenopathy;    thyroid:  no enlargement/tenderness/nodules; no carotid   bruit or JVD  Back:     Symmetric, no curvature, ROM normal, no CVA tenderness  Lungs:     Clear to auscultation bilaterally, respirations unlabored  Chest Wall:    No tenderness or deformity   Heart:    Regular rate and rhythm, S1 and S2 normal, no murmur, rub   or gallop  Breast Exam:    No tenderness, masses, or nipple abnormality  Abdomen:     Soft, non-tender, bowel  sounds active all four quadrants,    no masses, no organomegaly  Genitalia:    Pelvic: cervix normal in appearance, external genitalia normal, no adnexal masses or tenderness, no cervical motion tenderness, rectovaginal septum normal, uterus normal size, shape, and consistency and vagina normal without discharge  Extremities:   Extremities normal, atraumatic, no cyanosis or edema  Pulses:   2+ and symmetric all extremities  Skin:   Skin  color, texture, turgor normal, no rashes or lesions  Lymph nodes:   Cervical, supraclavicular, and axillary nodes normal  Neurologic:   CNII-XII intact, normal strength, sensation and reflexes    throughout   Assessment and Plan:   Encounter for preventive health examination Annual comprehensive exam was done including breast, pelvic and PAP smear. All screenings have been addressed .   Obesity, unspecified I have addressed her weight gain , current  BMI and recommended a low glycemic index diet utilizing smaller more frequent meals to increase metabolism.  I have also recommended that patient start exercising with a goal of 30 minutes of aerobic exercise a minimum of 5 days per week. Screening for lipid disorders, thyroid and diabetes to be done today.     Updated Medication List Outpatient Encounter Prescriptions as of 05/01/2013  Medication Sig  . aspirin 81 MG tablet Take 81 mg by mouth daily.  . calcium carbonate (OS-CAL) 600 MG TABS Take 600 mg by mouth 2 (two) times daily with a meal.  . Cholecalciferol (VITAMIN D-3) 1000 UNITS CAPS Take 1 capsule by mouth daily.  . Ibuprofen-Diphenhydramine HCl (ADVIL PM) 200-25 MG CAPS Take 2 capsules by mouth at bedtime.  . Multiple Vitamins-Minerals (MULTIVITAMIN WITH MINERALS) tablet Take 1 tablet by mouth daily.  [name] omeprazole (PRILOSEC) 20 MG capsule Take 20 mg by mouth daily.  . sertraline (ZOLOFT) 50 MG tablet Take 1 tablet (50 mg total) by mouth daily.  [name] zolpidem (AMBIEN) 5 MG tablet Take 1 tablet (5 mg total) by mouth at bedtime as needed for sleep.  . TDaP (BOOSTRIX) 5-2.5-18.5 LF-MCG/0.5 injection Inject 0.5 mLs into the muscle once.  . Tdap (BOOSTRIX) 5-2.5-18.5 LF-MCG/0.5 injection Inject 0.5 mLs into the muscle once.  . [DISCONTINUED] zoster vaccine live, PF, (ZOSTAVAX) 32122 UNT/0.65ML injection Inject 19,400 Units into the skin once.

## 2013-05-04 ENCOUNTER — Other Ambulatory Visit: Payer: Self-pay | Admitting: Internal Medicine

## 2013-05-04 ENCOUNTER — Encounter: Payer: Self-pay | Admitting: Emergency Medicine

## 2013-05-04 DIAGNOSIS — Z1231 Encounter for screening mammogram for malignant neoplasm of breast: Secondary | ICD-10-CM

## 2013-05-05 ENCOUNTER — Encounter: Payer: Self-pay | Admitting: *Deleted

## 2013-05-05 DIAGNOSIS — E785 Hyperlipidemia, unspecified: Secondary | ICD-10-CM | POA: Insufficient documentation

## 2013-05-05 MED ORDER — SIMVASTATIN 20 MG PO TABS: 20.0000 mg | ORAL_TABLET | Freq: Every day | ORAL | Status: DC

## 2013-05-05 NOTE — Addendum Note (Signed)
Addended by: Crecencio Mc on: 05/05/2013 06:08 AM   Modules accepted: Orders

## 2013-05-05 NOTE — Assessment & Plan Note (Signed)
New ACC guidelines recommend starting patients aged 70 or higher on moderate intensity statin therapy for LDL between 70-189 and 10 yr risk of CAD > 7.5% ;  Patient willing to start simvastatin.

## 2013-05-25 ENCOUNTER — Other Ambulatory Visit: Payer: Self-pay | Admitting: Internal Medicine

## 2013-07-06 ENCOUNTER — Other Ambulatory Visit (INDEPENDENT_AMBULATORY_CARE_PROVIDER_SITE_OTHER): Payer: Medicare Other

## 2013-07-06 DIAGNOSIS — E785 Hyperlipidemia, unspecified: Secondary | ICD-10-CM

## 2013-07-06 DIAGNOSIS — Z79899 Other long term (current) drug therapy: Secondary | ICD-10-CM

## 2013-07-06 LAB — COMPREHENSIVE METABOLIC PANEL
ALBUMIN: 4.1 g/dL (ref 3.5–5.2)
ALT: 23 U/L (ref 0–35)
AST: 21 U/L (ref 0–37)
Alkaline Phosphatase: 68 U/L (ref 39–117)
BUN: 14 mg/dL (ref 6–23)
CALCIUM: 10.2 mg/dL (ref 8.4–10.5)
CO2: 30 meq/L (ref 19–32)
Chloride: 108 mEq/L (ref 96–112)
Creatinine, Ser: 0.7 mg/dL (ref 0.4–1.2)
GFR: 95.71 mL/min (ref >60.00)
Glucose, Bld: 103 mg/dL — ABNORMAL HIGH (ref 70–99)
POTASSIUM: 4.6 meq/L (ref 3.5–5.1)
Sodium: 142 mEq/L (ref 135–145)
Total Bilirubin: 0.7 mg/dL (ref 0.2–1.2)
Total Protein: 6.5 g/dL (ref 6.0–8.3)

## 2013-07-06 LAB — LDL CHOLESTEROL, DIRECT: Direct LDL: 102.2 mg/dL

## 2013-07-09 ENCOUNTER — Encounter: Payer: Self-pay | Admitting: *Deleted

## 2013-08-12 ENCOUNTER — Encounter (INDEPENDENT_AMBULATORY_CARE_PROVIDER_SITE_OTHER): Payer: Self-pay

## 2013-08-12 ENCOUNTER — Ambulatory Visit
Admission: RE | Admit: 2013-08-12 | Discharge: 2013-08-12 | Disposition: A | Discharge status: Home / Self Care | Payer: Medicare Other | Source: Ambulatory Visit | Attending: Internal Medicine | Admitting: Internal Medicine

## 2013-08-12 DIAGNOSIS — Z1231 Encounter for screening mammogram for malignant neoplasm of breast: Secondary | ICD-10-CM

## 2013-08-24 ENCOUNTER — Ambulatory Visit (INDEPENDENT_AMBULATORY_CARE_PROVIDER_SITE_OTHER): Payer: Medicare Other | Admitting: Adult Health

## 2013-08-24 ENCOUNTER — Encounter: Payer: Self-pay | Admitting: Adult Health

## 2013-08-24 VITALS — BP 124/72 | HR 97 | Temp 98.8°F | Resp 18 | Ht 72.0 in | Wt 251.5 lb

## 2013-08-24 DIAGNOSIS — J029 Acute pharyngitis, unspecified: Secondary | ICD-10-CM

## 2013-08-24 DIAGNOSIS — I2699 Other pulmonary embolism without acute cor pulmonale: Secondary | ICD-10-CM

## 2013-08-24 DIAGNOSIS — J069 Acute upper respiratory infection, unspecified: Secondary | ICD-10-CM | POA: Diagnosis not present

## 2013-08-24 LAB — POCT RAPID STREP A (OFFICE): RAPID STREP A SCREEN: NEGATIVE

## 2013-08-24 MED ORDER — PREDNISONE 10 MG PO TABS: ORAL_TABLET | ORAL | Status: DC

## 2013-08-24 MED ORDER — ALBUTEROL SULFATE (2.5 MG/3ML) 0.083% IN NEBU
2.5000 mg | INHALATION_SOLUTION | Freq: Once | RESPIRATORY_TRACT | Status: AC
  Administered 2013-08-24: 2.5 mg via RESPIRATORY_TRACT

## 2013-08-24 MED ORDER — AMOXICILLIN-POT CLAVULANATE 875-125 MG PO TABS: 1.0000 | ORAL_TABLET | Freq: Two times a day (BID) | ORAL | Status: DC

## 2013-08-24 NOTE — Progress Notes (Signed)
Patient ID: Dana Burke, female   DOB: January 23, 1945, 68 y.o.   MRN: [id number]   Subjective:    Patient ID: Dana Burke, female    DOB: 1945-08-31, 68 y.o.   MRN: [id number]  HPI  Pt is a 68 y/o female who presents to clinic with left sided facial pain (sinuses), green drainage, left ear pain, and sore throat mainly on the left side. She reports symptoms have been ongoing x 7 days.    Past Medical History  Diagnosis Date  . Phlebitis and thrombophlebitis of the leg   . Pulmonary embolism   . GERD (gastroesophageal reflux disease)   . Depression     recent events    Current Outpatient Prescriptions on File Prior to Visit  Medication Sig Dispense Refill  . aspirin 81 MG tablet Take 81 mg by mouth daily.      . calcium carbonate (OS-CAL) 600 MG TABS Take 600 mg by mouth 2 (two) times daily with a meal.      . Cholecalciferol (VITAMIN D-3) 1000 UNITS CAPS Take 1 capsule by mouth daily.      . Ibuprofen-Diphenhydramine HCl (ADVIL PM) 200-25 MG CAPS Take 2 capsules by mouth at bedtime.      . Multiple Vitamins-Minerals (MULTIVITAMIN WITH MINERALS) tablet Take 1 tablet by mouth daily.      [name] omeprazole (PRILOSEC) 20 MG capsule Take 20 mg by mouth daily.      . sertraline (ZOLOFT) 50 MG tablet TAKE 1 TABLET BY MOUTH ONCE DAILY  90 tablet  0  . simvastatin (ZOCOR) 20 MG tablet Take 1 tablet (20 mg total) by mouth at bedtime.  90 tablet  3  . TDaP (BOOSTRIX) 5-2.5-18.5 LF-MCG/0.5 injection Inject 0.5 mLs into the muscle once.  0.5 mL  0  . Tdap (BOOSTRIX) 5-2.5-18.5 LF-MCG/0.5 injection Inject 0.5 mLs into the muscle once.  0.5 mL  0   No current facility-administered medications on file prior to visit.     Review of Systems  Constitutional: Negative for fever and chills.  HENT: Positive for congestion, postnasal drip, rhinorrhea, sinus pressure and sore throat.   Respiratory: Positive for cough and wheezing.   Musculoskeletal:       Swollen and painful right hand s/p injury      Objective:  BP 124/72  Pulse 97  Temp(Src) 98.8 F (37.1 C) (Oral)  Resp 18  Wt 251 lb 8 oz (114.08 kg)  SpO2 97%   Physical Exam  Constitutional: She is oriented to person, place, and time. She appears well-developed and well-nourished. No distress.  HENT:  Head: Normocephalic and atraumatic.  Right Ear: External ear normal.  Left Ear: External ear normal.  Mouth/Throat: No oropharyngeal exudate.  Cardiovascular: Normal rate, regular rhythm and normal heart sounds.  Exam reveals no gallop.   No murmur heard. Pulmonary/Chest: Effort normal and breath sounds normal. No respiratory distress. She has no wheezes. She has no rales.  Lymphadenopathy:    She has cervical adenopathy.  Neurological: She is alert and oriented to person, place, and time.  Psychiatric: She has a normal mood and affect. Her behavior is normal. Judgment and thought content normal.      Assessment & Plan:   1. Acute upper respiratory infections of unspecified site Suspect sinuses. Will start on Augmentin bid x 10 days. Pt received albuterol neb tx in office 2/2 wheezing. Will also start her on prednisone taper. Chloraseptic spray for sore throat. RTC if no improvement within 4-5 days  or sooner if necessary.  2. Sore throat Negative for strep. See instructions above. - POCT rapid strep A

## 2013-08-24 NOTE — Patient Instructions (Signed)
  Augmentin 1 tablet twice a day for 10 days.  Start prednisone taper as follows:  Day #1 - take 6 tablets Day #2 - take 5 tablets Day #3 - take 4 tablets Day #4 - take 3 tablets Day #5 - take 2 tablets Day #6 - take 1 tablet  Drink plenty of water to maintain hydration.  Chloraceptic spray for sore throat as needed.

## 2013-08-24 NOTE — Progress Notes (Signed)
Pre visit review using our clinic review tool, if applicable. No additional management support is needed unless otherwise documented below in the visit note. 

## 2013-08-26 ENCOUNTER — Other Ambulatory Visit: Payer: Self-pay | Admitting: Internal Medicine

## 2013-11-03 ENCOUNTER — Encounter (INDEPENDENT_AMBULATORY_CARE_PROVIDER_SITE_OTHER): Payer: Self-pay

## 2013-11-03 ENCOUNTER — Ambulatory Visit (INDEPENDENT_AMBULATORY_CARE_PROVIDER_SITE_OTHER): Payer: Medicare Other

## 2013-11-03 DIAGNOSIS — Z23 Encounter for immunization: Secondary | ICD-10-CM | POA: Diagnosis not present

## 2013-11-27 ENCOUNTER — Other Ambulatory Visit: Payer: Self-pay | Admitting: Internal Medicine

## 2013-11-27 NOTE — Telephone Encounter (Signed)
Electronic Rx request for Zoloft received. Patient's last office visit with PCP was 05/01/13 and medication last filled 08/26/13.

## 2013-11-27 NOTE — Telephone Encounter (Signed)
Ok to refill,  Refill sent  

## 2014-01-29 ENCOUNTER — Other Ambulatory Visit: Payer: Self-pay | Admitting: Internal Medicine

## 2014-05-05 ENCOUNTER — Ambulatory Visit (INDEPENDENT_AMBULATORY_CARE_PROVIDER_SITE_OTHER): Payer: Medicare Other | Admitting: Internal Medicine

## 2014-05-05 ENCOUNTER — Encounter: Payer: Self-pay | Admitting: Internal Medicine

## 2014-05-05 VITALS — BP 116/72 | HR 80 | Temp 98.4°F | Resp 14 | Ht 70.25 in | Wt 254.5 lb

## 2014-05-05 DIAGNOSIS — Z23 Encounter for immunization: Secondary | ICD-10-CM

## 2014-05-05 DIAGNOSIS — Z1159 Encounter for screening for other viral diseases: Secondary | ICD-10-CM

## 2014-05-05 DIAGNOSIS — F320 Major depressive disorder, single episode, mild: Secondary | ICD-10-CM

## 2014-05-05 DIAGNOSIS — Z0000 Encounter for general adult medical examination without abnormal findings: Secondary | ICD-10-CM

## 2014-05-05 DIAGNOSIS — F329 Major depressive disorder, single episode, unspecified: Secondary | ICD-10-CM

## 2014-05-05 DIAGNOSIS — E669 Obesity, unspecified: Secondary | ICD-10-CM

## 2014-05-05 DIAGNOSIS — R5383 Other fatigue: Secondary | ICD-10-CM | POA: Diagnosis not present

## 2014-05-05 DIAGNOSIS — Z1239 Encounter for other screening for malignant neoplasm of breast: Secondary | ICD-10-CM

## 2014-05-05 DIAGNOSIS — Z13820 Encounter for screening for osteoporosis: Secondary | ICD-10-CM

## 2014-05-05 DIAGNOSIS — Z9189 Other specified personal risk factors, not elsewhere classified: Secondary | ICD-10-CM

## 2014-05-05 DIAGNOSIS — Z87898 Personal history of other specified conditions: Secondary | ICD-10-CM

## 2014-05-05 DIAGNOSIS — Z86718 Personal history of other venous thrombosis and embolism: Secondary | ICD-10-CM

## 2014-05-05 DIAGNOSIS — E559 Vitamin D deficiency, unspecified: Secondary | ICD-10-CM

## 2014-05-05 DIAGNOSIS — E785 Hyperlipidemia, unspecified: Secondary | ICD-10-CM

## 2014-05-05 DIAGNOSIS — F32A Depression, unspecified: Secondary | ICD-10-CM

## 2014-05-05 LAB — CBC WITH DIFFERENTIAL/PLATELET
BASOS PCT: 0.4 % (ref 0.0–3.0)
Basophils Absolute: 0 10*3/uL (ref 0.0–0.1)
Eosinophils Absolute: 0.2 10*3/uL (ref 0.0–0.7)
Eosinophils Relative: 3 % (ref 0.0–5.0)
HCT: 41.8 % (ref 36.0–46.0)
Hemoglobin: 14.2 g/dL (ref 12.0–15.0)
Lymphocytes Relative: 38.7 % (ref 12.0–46.0)
Lymphs Abs: 2 10*3/uL (ref 0.7–4.0)
MCHC: 33.9 g/dL (ref 30.0–36.0)
MCV: 89.1 fl (ref 78.0–100.0)
MONO ABS: 0.3 10*3/uL (ref 0.1–1.0)
MONOS PCT: 6.8 % (ref 3.0–12.0)
NEUTROS PCT: 51.1 % (ref 43.0–77.0)
Neutro Abs: 2.6 10*3/uL (ref 1.4–7.7)
PLATELETS: 209 10*3/uL (ref 150.0–400.0)
RBC: 4.7 Mil/uL (ref 3.87–5.11)
RDW: 13.4 % (ref 11.5–15.5)
WBC: 5.1 10*3/uL (ref 4.0–10.5)

## 2014-05-05 LAB — COMPREHENSIVE METABOLIC PANEL
ALT: 21 U/L (ref 0–35)
AST: 19 U/L (ref 0–37)
Albumin: 4.3 g/dL (ref 3.5–5.2)
Alkaline Phosphatase: 86 U/L (ref 39–117)
BUN: 16 mg/dL (ref 6–23)
CALCIUM: 10.6 mg/dL — AB (ref 8.4–10.5)
CO2: 30 mEq/L (ref 19–32)
Chloride: 105 mEq/L (ref 96–112)
Creatinine, Ser: 0.78 mg/dL (ref 0.40–1.20)
GFR: 77.36 mL/min (ref >60.00)
Glucose, Bld: 89 mg/dL (ref 70–99)
Potassium: 5 mEq/L (ref 3.5–5.1)
Sodium: 139 mEq/L (ref 135–145)
TOTAL PROTEIN: 6.8 g/dL (ref 6.0–8.3)
Total Bilirubin: 0.4 mg/dL (ref 0.2–1.2)

## 2014-05-05 LAB — LIPID PANEL
CHOLESTEROL: 180 mg/dL (ref 0–200)
HDL: 35.1 mg/dL — AB (ref >39.00)
LDL CALC: 118 mg/dL — AB (ref 0–99)
NonHDL: 144.9
TRIGLYCERIDES: 135 mg/dL (ref 0.0–149.0)
Total CHOL/HDL Ratio: 5
VLDL: 27 mg/dL (ref 0.0–40.0)

## 2014-05-05 MED ORDER — BUPROPION HCL ER (XL) 150 MG PO TB24: 150.0000 mg | ORAL_TABLET | Freq: Every day | ORAL | Status: DC

## 2014-05-05 NOTE — Patient Instructions (Signed)
Congratulations on the weight loss!!   You are well on your way to your first goal, which is  is 25 lb wt loss or 10%  over 6 months (4 lbs/month)   Ultimate goal is to get BMI < 30    (208 lbs)    3 D Mammogram is due in August,  You are due for a  DEXA scan to be done this year as well  I am starting you on wellbutrin to take instead of zoloft for depression .  continue the zoloft for one week while you are starting the wellbutrin   Take the wellbutrin in the morning  I can increase the dose after a few weeks if you are tolerating it but not feeling more energetic.  Health Maintenance Adopting a healthy lifestyle and getting preventive care can go a long way to promote health and wellness. Talk with your health care provider about what schedule of regular examinations is right for you. This is a good chance for you to check in with your provider about disease prevention and staying healthy. In between checkups, there are plenty of things you can do on your own. Experts have done a lot of research about which lifestyle changes and preventive measures are most likely to keep you healthy. Ask your health care provider for more information. WEIGHT AND DIET  Eat a healthy diet  Be sure to include plenty of vegetables, fruits, low-fat dairy products, and lean protein.  Do not eat a lot of foods high in solid fats, added sugars, or salt.  Get regular exercise. This is one of the most important things you can do for your health.  Most adults should exercise for at least 150 minutes each week. The exercise should increase your heart rate and make you sweat (moderate-intensity exercise).  Most adults should also do strengthening exercises at least twice a week. This is in addition to the moderate-intensity exercise.  Maintain a healthy weight  Body mass index (BMI) is a measurement that can be used to identify possible weight problems. It estimates body fat based on height and weight. Your  health care provider can help determine your BMI and help you achieve or maintain a healthy weight.  For females 65 years of age and older:   A BMI below 18.5 is considered underweight.  A BMI of 18.5 to 24.9 is normal.  A BMI of 25 to 29.9 is considered overweight.  A BMI of 30 and above is considered obese.  Watch levels of cholesterol and blood lipids  You should start having your blood tested for lipids and cholesterol at 71 years of age, then have this test every 5 years.  You may need to have your cholesterol levels checked more often if:  Your lipid or cholesterol levels are high.  You are older than 71 years of age.  You are at high risk for heart disease.  CANCER SCREENING   Lung Cancer  Lung cancer screening is recommended for adults 82-65 years old who are at high risk for lung cancer because of a history of smoking.  A yearly low-dose CT scan of the lungs is recommended for people who:  Currently smoke.  Have quit within the past 15 years.  Have at least a 30-pack-year history of smoking. A pack year is smoking an average of one pack of cigarettes a day for 1 year.  Yearly screening should continue until it has been 15 years since you quit.  Yearly screening  should stop if you develop a health problem that would prevent you from having lung cancer treatment.  Breast Cancer  Practice breast self-awareness. This means understanding how your breasts normally appear and feel.  It also means doing regular breast self-exams. Let your health care provider know about any changes, no matter how small.  If you are in your 20s or 30s, you should have a clinical breast exam (CBE) by a health care provider every 1-3 years as part of a regular health exam.  If you are 76 or older, have a CBE every year. Also consider having a breast X-ray (mammogram) every year.  If you have a family history of breast cancer, talk to your health care provider about genetic  screening.  If you are at high risk for breast cancer, talk to your health care provider about having an MRI and a mammogram every year.  Breast cancer gene (BRCA) assessment is recommended for women who have family members with BRCA-related cancers. BRCA-related cancers include:  Breast.  Ovarian.  Tubal.  Peritoneal cancers.  Results of the assessment will determine the need for genetic counseling and BRCA1 and BRCA2 testing. Cervical Cancer Routine pelvic examinations to screen for cervical cancer are no longer recommended for nonpregnant women who are considered low risk for cancer of the pelvic organs (ovaries, uterus, and vagina) and who do not have symptoms. A pelvic examination may be necessary if you have symptoms including those associated with pelvic infections. Ask your health care provider if a screening pelvic exam is right for you.   The Pap test is the screening test for cervical cancer for women who are considered at risk.  If you had a hysterectomy for a problem that was not cancer or a condition that could lead to cancer, then you no longer need Pap tests.  If you are older than 65 years, and you have had normal Pap tests for the past 10 years, you no longer need to have Pap tests.  If you have had past treatment for cervical cancer or a condition that could lead to cancer, you need Pap tests and screening for cancer for at least 20 years after your treatment.  If you no longer get a Pap test, assess your risk factors if they change (such as having a new sexual partner). This can affect whether you should start being screened again.  Some women have medical problems that increase their chance of getting cervical cancer. If this is the case for you, your health care provider may recommend more frequent screening and Pap tests.  The human papillomavirus (HPV) test is another test that may be used for cervical cancer screening. The HPV test looks for the virus that can  cause cell changes in the cervix. The cells collected during the Pap test can be tested for HPV.  The HPV test can be used to screen women 27 years of age and older. Getting tested for HPV can extend the interval between normal Pap tests from three to five years.  An HPV test also should be used to screen women of any age who have unclear Pap test results.  After 71 years of age, women should have HPV testing as often as Pap tests.  Colorectal Cancer  This type of cancer can be detected and often prevented.  Routine colorectal cancer screening usually begins at 71 years of age and continues through 71 years of age.  Your health care provider may recommend screening at an earlier  age if you have risk factors for colon cancer.  Your health care provider may also recommend using home test kits to check for hidden blood in the stool.  A small camera at the end of a tube can be used to examine your colon directly (sigmoidoscopy or colonoscopy). This is done to check for the earliest forms of colorectal cancer.  Routine screening usually begins at age 28.  Direct examination of the colon should be repeated every 5-10 years through 71 years of age. However, you may need to be screened more often if early forms of precancerous polyps or small growths are found. Skin Cancer  Check your skin from head to toe regularly.  Tell your health care provider about any new moles or changes in moles, especially if there is a change in a mole's shape or color.  Also tell your health care provider if you have a mole that is larger than the size of a pencil eraser.  Always use sunscreen. Apply sunscreen liberally and repeatedly throughout the day.  Protect yourself by wearing long sleeves, pants, a wide-brimmed hat, and sunglasses whenever you are outside. HEART DISEASE, DIABETES, AND HIGH BLOOD PRESSURE   Have your blood pressure checked at least every 1-2 years. High blood pressure causes heart  disease and increases the risk of stroke.  If you are between 86 years and 3 years old, ask your health care provider if you should take aspirin to prevent strokes.  Have regular diabetes screenings. This involves taking a blood sample to check your fasting blood sugar level.  If you are at a normal weight and have a low risk for diabetes, have this test once every three years after 71 years of age.  If you are overweight and have a high risk for diabetes, consider being tested at a younger age or more often. PREVENTING INFECTION  Hepatitis B  If you have a higher risk for hepatitis B, you should be screened for this virus. You are considered at high risk for hepatitis B if:  You were born in a country where hepatitis B is common. Ask your health care provider which countries are considered high risk.  Your parents were born in a high-risk country, and you have not been immunized against hepatitis B (hepatitis B vaccine).  You have HIV or AIDS.  You use needles to inject street drugs.  You live with someone who has hepatitis B.  You have had sex with someone who has hepatitis B.  You get hemodialysis treatment.  You take certain medicines for conditions, including cancer, organ transplantation, and autoimmune conditions. Hepatitis C  Blood testing is recommended for:  Everyone born from [birth year] through 1967.  Anyone with known risk factors for hepatitis C. Sexually transmitted infections (STIs)  You should be screened for sexually transmitted infections (STIs) including gonorrhea and chlamydia if:  You are sexually active and are younger than 71 years of age.  You are older than 71 years of age and your health care provider tells you that you are at risk for this type of infection.  Your sexual activity has changed since you were last screened and you are at an increased risk for chlamydia or gonorrhea. Ask your health care provider if you are at risk.  If you do not have  HIV, but are at risk, it may be recommended that you take a prescription medicine daily to prevent HIV infection. This is called pre-exposure prophylaxis (PrEP). You are considered at risk if:  You are sexually active and do not regularly use condoms or know the HIV status of your partner(s).  You take drugs by injection.  You are sexually active with a partner who has HIV. Talk with your health care provider about whether you are at high risk of being infected with HIV. If you choose to begin PrEP, you should first be tested for HIV. You should then be tested every 3 months for as long as you are taking PrEP.  PREGNANCY   If you are premenopausal and you may become pregnant, ask your health care provider about preconception counseling.  If you may become pregnant, take 400 to 800 micrograms (mcg) of folic acid every day.  If you want to prevent pregnancy, talk to your health care provider about birth control (contraception). OSTEOPOROSIS AND MENOPAUSE   Osteoporosis is a disease in which the bones lose minerals and strength with aging. This can result in serious bone fractures. Your risk for osteoporosis can be identified using a bone density scan.  If you are 78 years of age or older, or if you are at risk for osteoporosis and fractures, ask your health care provider if you should be screened.  Ask your health care provider whether you should take a calcium or vitamin D supplement to lower your risk for osteoporosis.  Menopause may have certain physical symptoms and risks.  Hormone replacement therapy may reduce some of these symptoms and risks. Talk to your health care provider about whether hormone replacement therapy is right for you.  HOME CARE INSTRUCTIONS   Schedule regular health, dental, and eye exams.  Stay current with your immunizations.   Do not use any tobacco products including cigarettes, chewing tobacco, or electronic cigarettes.  If you are pregnant, do not  drink alcohol.  If you are breastfeeding, limit how much and how often you drink alcohol.  Limit alcohol intake to no more than 1 drink per day for nonpregnant women. One drink equals 12 ounces of beer, 5 ounces of wine, or 1 ounces of hard liquor.  Do not use street drugs.  Do not share needles.  Ask your health care provider for help if you need support or information about quitting drugs.  Tell your health care provider if you often feel depressed.  Tell your health care provider if you have ever been abused or do not feel safe at home. Document Released: 07/17/2010 Document Revised: 05/18/2013 Document Reviewed: 12/03/2012 [facility] Patient Information 2015 [place], [place]. This information is not intended to replace advice given to you by your health care provider. Make sure you discuss any questions you have with your health care provider.

## 2014-05-05 NOTE — Progress Notes (Signed)
Patient ID: Dana Burke, female   DOB: 12/25/1945, 69 y.o.   MRN: [id number]  The patient is here for annual Medicare wellness examination and management of other chronic and acute problems.  Increased isolation since husband died now nearly two years ago.  Has been taking zoloft for years,  No improvement with time.  Does not socialize much daughters are several hours away.   Has been losing weight, intentionally , eating less since she is only cooking for one.  Sleeping ok.     The risk factors are reflected in the social history.  The roster of all physicians providing medical care to patient - is listed in the Snapshot section of the chart.  Activities of daily living:  The patient is 100% independent in all ADLs: dressing, toileting, feeding as well as independent mobility  Home safety : The patient has smoke detectors in the home. They wear seatbelts.  There are no firearms at home. There is no violence in the home.   There is no risks for hepatitis, STDs or HIV. There is no   history of blood transfusion. They have no travel history to infectious disease endemic areas of the world.  The patient has seen their dentist in the last six month. They have seen their eye doctor in the last year. They admit to slight hearing difficulty with regard to whispered voices and some television programs.  They have deferred audiologic testing in the last year.  They do not  have excessive sun exposure. Discussed the need for sun protection: hats, long sleeves and use of sunscreen if there is significant sun exposure.   Diet: the importance of a healthy diet is discussed. They do have a healthy diet.  The benefits of regular aerobic exercise were discussed. She walks 4 times per week ,  20 minutes.   Depression screen: there are no signs or vegative symptoms of depression- irritability, change in appetite, anhedonia, sadness/tearfullness.  Cognitive assessment: the patient manages all their financial  and personal affairs and is actively engaged. They could relate day,date,year and events; recalled 2/3 objects at 3 minutes; performed clock-face test normally.  The following portions of the patient's history were reviewed and updated as appropriate: allergies, current medications, past family history, past medical history,  past surgical history, past social history  and problem list.  Visual acuity was not assessed per patient preference since she has regular follow up with her ophthalmologist. Hearing and body mass index were assessed and reviewed.   During the course of the visit the patient was educated and counseled about appropriate screening and preventive services including : fall prevention , diabetes screening, nutrition counseling, colorectal cancer screening, and recommended immunizations.    Review of Systems:  Patient denies headache, fevers, malaise, unintentional weight loss, skin rash, eye pain, sinus congestion and sinus pain, sore throat, dysphagia,  hemoptysis , cough, dyspnea, wheezing, chest pain, palpitations, orthopnea, edema, abdominal pain, nausea, melena, diarrhea, constipation, flank pain, dysuria, hematuria, urinary  Frequency, nocturia, numbness, tingling, seizures,  Focal weakness, Loss of consciousness,  Tremor, insomnia, depression, anxiety, and suicidal ideation.   Objective:  BP 116/72 mmHg  Pulse 80  Temp(Src) 98.4 F (36.9 C) (Oral)  Resp 14  Ht 5' 10.25" (1.784 m)  Wt 254 lb 8 oz (115.44 kg)  BMI 36.27 kg/m2  SpO2 94%  General appearance: alert, cooperative and appears stated age Head: Normocephalic, without obvious abnormality, atraumatic Eyes: conjunctivae/corneas clear. PERRL, EOM's intact. Fundi benign. Ears: normal TM's  and external ear canals both ears Nose: Nares normal. Septum midline. Mucosa normal. No drainage or sinus tenderness. Throat: lips, mucosa, and tongue normal; teeth and gums normal Neck: no adenopathy, no carotid bruit, no  JVD, supple, symmetrical, trachea midline and thyroid not enlarged, symmetric, no tenderness/mass/nodules Lungs: clear to auscultation bilaterally Breasts: normal appearance, no masses or tenderness Heart: regular rate and rhythm, S1, S2 normal, no murmur, click, rub or gallop Abdomen: soft, non-tender; bowel sounds normal; no masses,  no organomegaly Extremities: extremities normal, atraumatic, no cyanosis or edema Pulses: 2+ and symmetric Skin: Skin color, texture, turgor normal. No rashes or lesions Neurologic: Alert and oriented X 3, normal strength and tone. Normal symmetric reflexes. Normal coordination and gait.     Assessment and Plan:  Problem List Items Addressed This Visit    Depression    We discussed change from zoloft to wellbutrin with a one week overlap      Relevant Medications   buPROPion (WELLBUTRIN XL) 150 MG 24 hr tablet   Obesity    She has lost 8 lbs in 2 months using weight watchers and walking.  I have congratulated her in reduction of   BMI and encouraged  Continued weight loss with goal of 10% of body weigh over the next 6 months using a low glycemic index diet and regular exercise a minimum of 5 days per week.  Goal is 25 lbs in 6 moths and eventual goal is 208 lbs to get BMI < 30       History of abnormal mammogram    continue annual 3D mammograms at [facility] due in august       Medicare annual wellness visit, subsequent    Annual Medicare wellness  exam was done as well as a comprehensive physical exam and management of acute and chronic conditions .  During the course of the visit the patient was educated and counseled about appropriate screening and preventive services including : fall prevention , diabetes screening, nutrition counseling, colorectal cancer screening, and recommended immunizations.  Printed recommendations for health maintenance screenings was given.       Hyperlipidemia LDL goal <130    Well controlled on current statin therapy.    Liver enzymes are normal , no changes today.  Lab Results  Component Value Date   CHOL 180 05/05/2014   HDL 35.10* 05/05/2014   LDLCALC 118* 05/05/2014   LDLDIRECT 102.2 07/06/2013   TRIG 135.0 05/05/2014   CHOLHDL 5 05/05/2014   Lab Results  Component Value Date   ALT 21 05/05/2014   AST 19 05/05/2014   ALKPHOS 86 05/05/2014   BILITOT 0.4 05/05/2014         Relevant Medications   simvastatin (ZOCOR) 20 MG tablet   History of DVT of lower extremity   Major depressive disorder, single episode    Trail of wellbutrin, since she has no improvement with zoloft.       Relevant Medications   buPROPion (WELLBUTRIN XL) 150 MG 24 hr tablet   Screening for osteoporosis - Primary    DEXA scan ordered. Discussed the current controversies surrounding the risks and benefits of calcium supplementation.  Encouraged her to increase dietary calcium through natural foods including almond/coconut milk      Relevant Orders   DG Bone Density    Other Visit Diagnoses    Breast cancer screening        Relevant Orders    MM DIGITAL SCREENING BILATERAL    Hyperlipidemia  Relevant Medications    simvastatin (ZOCOR) 20 MG tablet    Other Relevant Orders    Lipid panel (Completed)    Vitamin D deficiency        Relevant Orders    Vit D  25 hydroxy (rtn osteoporosis monitoring) (Completed)    Other fatigue        Relevant Orders    CBC with Differential/Platelet (Completed)    Comprehensive metabolic panel (Completed)    TSH (Completed)    Need for hepatitis C screening test        Relevant Orders    Hepatitis C antibody (Completed)    Need for prophylactic vaccination against Streptococcus pneumoniae (pneumococcus)        Relevant Orders    Pneumococcal polysaccharide vaccine 23-valent greater than or equal to 71yo subcutaneous/IM (Completed)

## 2014-05-05 NOTE — Assessment & Plan Note (Signed)
She has lost 8 lbs in 2 months using weight watchers and walking.  I have congratulated her in reduction of   BMI and encouraged  Continued weight loss with goal of 10% of body weigh over the next 6 months using a low glycemic index diet and regular exercise a minimum of 5 days per week.  Goal is 25 lbs in 6 moths and eventual goal is 208 lbs to get BMI < 30

## 2014-05-05 NOTE — Assessment & Plan Note (Addendum)
continue annual 3D mammograms at [facility] due in august

## 2014-05-05 NOTE — Assessment & Plan Note (Signed)
We discussed change from zoloft to wellbutrin with a one week overlap

## 2014-05-05 NOTE — Assessment & Plan Note (Addendum)
Well controlled on current statin therapy.   Liver enzymes are normal , no changes today.  Lab Results  Component Value Date   CHOL 180 05/05/2014   HDL 35.10* 05/05/2014   LDLCALC 118* 05/05/2014   LDLDIRECT 102.2 07/06/2013   TRIG 135.0 05/05/2014   CHOLHDL 5 05/05/2014   Lab Results  Component Value Date   ALT 21 05/05/2014   AST 19 05/05/2014   ALKPHOS 86 05/05/2014   BILITOT 0.4 05/05/2014

## 2014-05-06 LAB — TSH: TSH: 0.74 u[IU]/mL (ref 0.35–4.50)

## 2014-05-06 LAB — HEPATITIS C ANTIBODY: HCV Ab: NEGATIVE

## 2014-05-06 LAB — VITAMIN D 25 HYDROXY (VIT D DEFICIENCY, FRACTURES): VITD: 32.93 ng/mL (ref 30.00–100.00)

## 2014-05-07 ENCOUNTER — Encounter: Payer: Self-pay | Admitting: *Deleted

## 2014-05-08 ENCOUNTER — Encounter: Payer: Self-pay | Admitting: Internal Medicine

## 2014-05-08 DIAGNOSIS — F321 Major depressive disorder, single episode, moderate: Secondary | ICD-10-CM | POA: Insufficient documentation

## 2014-05-08 DIAGNOSIS — Z13820 Encounter for screening for osteoporosis: Secondary | ICD-10-CM | POA: Insufficient documentation

## 2014-05-08 DIAGNOSIS — F329 Major depressive disorder, single episode, unspecified: Secondary | ICD-10-CM | POA: Insufficient documentation

## 2014-05-08 MED ORDER — SIMVASTATIN 20 MG PO TABS: ORAL_TABLET | ORAL | Status: DC

## 2014-05-08 NOTE — Assessment & Plan Note (Signed)

## 2014-05-08 NOTE — Assessment & Plan Note (Signed)
Trail of wellbutrin, since she has no improvement with zoloft.

## 2014-05-08 NOTE — Assessment & Plan Note (Signed)
DEXA scan ordered. Discussed the current controversies surrounding the risks and benefits of calcium supplementation.  Encouraged her to increase dietary calcium through natural foods including almond/coconut milk

## 2014-05-31 ENCOUNTER — Encounter: Payer: Self-pay | Admitting: Internal Medicine

## 2014-05-31 ENCOUNTER — Ambulatory Visit (INDEPENDENT_AMBULATORY_CARE_PROVIDER_SITE_OTHER): Payer: Medicare Other | Admitting: Internal Medicine

## 2014-05-31 VITALS — BP 118/74 | HR 84 | Temp 98.5°F | Resp 16 | Ht 70.25 in | Wt 251.0 lb

## 2014-05-31 DIAGNOSIS — G629 Polyneuropathy, unspecified: Secondary | ICD-10-CM

## 2014-05-31 DIAGNOSIS — R208 Other disturbances of skin sensation: Secondary | ICD-10-CM | POA: Diagnosis not present

## 2014-05-31 DIAGNOSIS — G2581 Restless legs syndrome: Secondary | ICD-10-CM | POA: Diagnosis not present

## 2014-05-31 DIAGNOSIS — R209 Unspecified disturbances of skin sensation: Secondary | ICD-10-CM | POA: Diagnosis not present

## 2014-05-31 MED ORDER — GABAPENTIN 100 MG PO CAPS: 100.0000 mg | ORAL_CAPSULE | Freq: Three times a day (TID) | ORAL | Status: DC

## 2014-05-31 NOTE — Progress Notes (Signed)
Patient ID: Dana Burke, female   DOB: 1945-06-16, 69 y.o.   MRN: [id number]

## 2014-05-31 NOTE — Patient Instructions (Signed)

## 2014-05-31 NOTE — Progress Notes (Signed)
Pre-visit discussion using our clinic review tool. No additional management support is needed unless otherwise documented below in the visit note.  

## 2014-06-01 ENCOUNTER — Other Ambulatory Visit (INDEPENDENT_AMBULATORY_CARE_PROVIDER_SITE_OTHER): Payer: Medicare Other

## 2014-06-01 DIAGNOSIS — R208 Other disturbances of skin sensation: Secondary | ICD-10-CM | POA: Diagnosis not present

## 2014-06-01 LAB — TSH: TSH: 1.1 u[IU]/mL (ref 0.35–4.50)

## 2014-06-01 LAB — HEMOGLOBIN A1C: Hgb A1c MFr Bld: 5.9 % (ref 4.6–6.5)

## 2014-06-01 LAB — CBC WITH DIFFERENTIAL/PLATELET
BASOS PCT: 0.5 % (ref 0.0–3.0)
Basophils Absolute: 0 10*3/uL (ref 0.0–0.1)
EOS ABS: 0.2 10*3/uL (ref 0.0–0.7)
EOS PCT: 4.1 % (ref 0.0–5.0)
HCT: 41.3 % (ref 36.0–46.0)
Hemoglobin: 14 g/dL (ref 12.0–15.0)
LYMPHS PCT: 36.6 % (ref 12.0–46.0)
Lymphs Abs: 1.9 10*3/uL (ref 0.7–4.0)
MCHC: 34 g/dL (ref 30.0–36.0)
MCV: 89.2 fl (ref 78.0–100.0)
Monocytes Absolute: 0.4 10*3/uL (ref 0.1–1.0)
Monocytes Relative: 7.4 % (ref 3.0–12.0)
NEUTROS PCT: 51.4 % (ref 43.0–77.0)
Neutro Abs: 2.6 10*3/uL (ref 1.4–7.7)
Platelets: 196 10*3/uL (ref 150.0–400.0)
RBC: 4.62 Mil/uL (ref 3.87–5.11)
RDW: 13.1 % (ref 11.5–15.5)
WBC: 5.1 10*3/uL (ref 4.0–10.5)

## 2014-06-01 LAB — IBC PANEL
Iron: 71 ug/dL (ref 42–145)
Saturation Ratios: 20 % (ref 20.0–50.0)
Transferrin: 254 mg/dL (ref 212.0–360.0)

## 2014-06-01 LAB — VITAMIN B12: VITAMIN B 12: 379 pg/mL (ref 211–911)

## 2014-06-02 ENCOUNTER — Encounter: Payer: Self-pay | Admitting: Internal Medicine

## 2014-06-02 DIAGNOSIS — G629 Polyneuropathy, unspecified: Secondary | ICD-10-CM | POA: Insufficient documentation

## 2014-06-02 LAB — RPR

## 2014-06-02 NOTE — Progress Notes (Signed)
Subjective:  Patient ID: Dana Burke, female    DOB: 09-19-1945  Age: 68 y.o. MRN: [id number]  CC: The primary encounter diagnosis was Burning sensation in lower extremity. Diagnoses of Restless legs syndrome and Neuropathy were also pertinent to this visit.  HPI Dana Burke presents for evaluation of leg pain,  One month history of leg pain from knees down , described as a burning sensation,  At times pins and needles,  And legs feel cod.  The sensation is waking her up several times per night,  No leg weakness,  No hip or low back pain. Also having feeling of restless legs when woken up. Miserable.   Has lost 11 lbs so far since starting [employer],.   3 lbs since last visit.   Outpatient Prescriptions Prior to Visit  Medication Sig Dispense Refill  . aspirin 81 MG tablet Take 81 mg by mouth daily.    [name] buPROPion (WELLBUTRIN XL) 150 MG 24 hr tablet Take 1 tablet (150 mg total) by mouth daily. 90 tablet 0  . calcium carbonate (OS-CAL) 600 MG TABS Take 600 mg by mouth 2 (two) times daily with a meal.    . cetirizine (ZYRTEC) 10 MG tablet Take 10 mg by mouth daily.    . Cholecalciferol (VITAMIN D-3) 1000 UNITS CAPS Take 1 capsule by mouth daily.    . Ibuprofen-Diphenhydramine HCl (ADVIL PM) 200-25 MG CAPS Take 2 capsules by mouth at bedtime.    . Multiple Vitamins-Minerals (MULTIVITAMIN WITH MINERALS) tablet Take 1 tablet by mouth daily.    [name] omeprazole (PRILOSEC) 20 MG capsule Take 20 mg by mouth daily.    . simvastatin (ZOCOR) 20 MG tablet TAKE 1 TABLET BY MOUTH EVERY NIGHT AT BEDTIME. (Patient not taking: Reported on 05/31/2014) 90 tablet 1   No facility-administered medications prior to visit.    Review of Systems;  Patient denies headache, fevers, malaise, unintentional weight loss, skin rash, eye pain, sinus congestion and sinus pain, sore throat, dysphagia,  hemoptysis , cough, dyspnea, wheezing, chest pain, palpitations, orthopnea, edema, abdominal pain, nausea, melena,  diarrhea, constipation, flank pain, dysuria, hematuria, urinary  Frequency, nocturia, numbness, tingling, seizures,  Focal weakness, Loss of consciousness,  Tremor, insomnia, depression, anxiety, and suicidal ideation.      Objective:  BP 118/74 mmHg  Pulse 84  Temp(Src) 98.5 F (36.9 C) (Oral)  Resp 16  Ht 5' 10.25" (1.784 m)  Wt 251 lb (113.853 kg)  BMI 35.77 kg/m2  SpO2 97%  BP Readings from Last 3 Encounters:  05/31/14 118/74  05/05/14 116/72  08/24/13 124/72    Wt Readings from Last 3 Encounters:  05/31/14 251 lb (113.853 kg)  05/05/14 254 lb 8 oz (115.44 kg)  08/24/13 251 lb 8 oz (114.08 kg)    General appearance: alert, cooperative and appears stated age Ears: normal TM's and external ear canals both ears Throat: lips, mucosa, and tongue normal; teeth and gums normal Neck: no adenopathy, no carotid bruit, supple, symmetrical, trachea midline and thyroid not enlarged, symmetric, no tenderness/mass/nodules Back: symmetric, no curvature. ROM normal. No CVA tenderness. Lungs: clear to auscultation bilaterally Heart: regular rate and rhythm, S1, S2 normal, no murmur, click, rub or gallop Abdomen: soft, non-tender; bowel sounds normal; no masses,  no organomegaly Pulses: 2+ and symmetric Skin: Skin color, texture, turgor normal. No rashes or lesions Lymph nodes: Cervical, supraclavicular, and axillary nodes normal.  Lab Results  Component Value Date   HGBA1C 5.9 05/31/2014    Lab  Results  Component Value Date   CREATININE 0.78 05/05/2014   CREATININE 0.7 07/06/2013   CREATININE 0.7 05/01/2013    Lab Results  Component Value Date   WBC 5.1 05/31/2014   HGB 14.0 05/31/2014   HCT 41.3 05/31/2014   PLT 196.0 05/31/2014   GLUCOSE 89 05/05/2014   CHOL 180 05/05/2014   TRIG 135.0 05/05/2014   HDL 35.10* 05/05/2014   LDLDIRECT 102.2 07/06/2013   LDLCALC 118* 05/05/2014   ALT 21 05/05/2014   AST 19 05/05/2014   NA 139 05/05/2014   K 5.0 05/05/2014   CL 105  05/05/2014   CREATININE 0.78 05/05/2014   BUN 16 05/05/2014   CO2 30 05/05/2014   TSH 1.10 06/01/2014   HGBA1C 5.9 05/31/2014    Mm Screening Breast Tomo Bilateral  08/21/2013   CLINICAL DATA:  Screening.  EXAM: DIGITAL SCREENING BILATERAL MAMMOGRAM WITH 3D TOMO WITH CAD  COMPARISON:  Previous exam(s).  ACR Breast Density Category b: There are scattered areas of fibroglandular density.  FINDINGS: There are no findings suspicious for malignancy. Images were processed with CAD.  IMPRESSION: No mammographic evidence of malignancy. A result letter of this screening mammogram will be mailed directly to the patient.  RECOMMENDATION: Screening mammogram in one year. (Code:SM-B-01Y)  BI-RADS CATEGORY  1: Negative.   Electronically Signed   By: Lajean Manes M.D.   On: 08/21/2013 13:24    Assessment & Plan:   Problem List Items Addressed This Visit    Neuropathy    Exam was normal,  screening for thyroid, B12 deficiencies,  Syphilis and DM was done today and negative.  Trial of gabapentin for symptom relief and referral to Neurology for EMG/nerve conduction studies advised.        Other Visit Diagnoses    Burning sensation in lower extremity    -  Primary    Relevant Orders    Vitamin B12 (Completed)    RPR (Completed)    Hemoglobin A1c (Completed)    TSH (Completed)    Restless legs syndrome        Relevant Orders    CBC with Differential/Platelet (Completed)    IBC panel (Completed)       I am having Dana Burke start on gabapentin. I am also having her maintain her omeprazole, Ibuprofen-Diphenhydramine HCl, aspirin, multivitamin with minerals, calcium carbonate, Vitamin D-3, cetirizine, buPROPion, and simvastatin.  Meds ordered this encounter  Medications  . gabapentin (NEURONTIN) 100 MG capsule    Sig: Take 1 capsule (100 mg total) by mouth 3 (three) times daily.    Dispense:  90 capsule    Refill:  0    There are no discontinued medications.  Follow-up: No Follow-up on  file.   Crecencio Mc, MD

## 2014-06-02 NOTE — Assessment & Plan Note (Signed)
Exam was normal,  screening for thyroid, B12 deficiencies,  Syphilis and DM was done today and negative.  Trial of gabapentin for symptom relief and referral to Neurology for EMG/nerve conduction studies advised.

## 2014-06-07 DIAGNOSIS — L820 Inflamed seborrheic keratosis: Secondary | ICD-10-CM | POA: Diagnosis not present

## 2014-06-07 DIAGNOSIS — L249 Irritant contact dermatitis, unspecified cause: Secondary | ICD-10-CM | POA: Diagnosis not present

## 2014-06-07 DIAGNOSIS — D229 Melanocytic nevi, unspecified: Secondary | ICD-10-CM | POA: Diagnosis not present

## 2014-06-07 DIAGNOSIS — D1800 Hemangioma unspecified site: Secondary | ICD-10-CM | POA: Diagnosis not present

## 2014-06-07 DIAGNOSIS — Z1283 Encounter for screening for malignant neoplasm of skin: Secondary | ICD-10-CM | POA: Diagnosis not present

## 2014-06-07 DIAGNOSIS — L821 Other seborrheic keratosis: Secondary | ICD-10-CM | POA: Diagnosis not present

## 2014-06-07 DIAGNOSIS — L578 Other skin changes due to chronic exposure to nonionizing radiation: Secondary | ICD-10-CM | POA: Diagnosis not present

## 2014-06-07 DIAGNOSIS — L814 Other melanin hyperpigmentation: Secondary | ICD-10-CM | POA: Diagnosis not present

## 2014-06-07 DIAGNOSIS — L853 Xerosis cutis: Secondary | ICD-10-CM | POA: Diagnosis not present

## 2014-06-07 DIAGNOSIS — L859 Epidermal thickening, unspecified: Secondary | ICD-10-CM | POA: Diagnosis not present

## 2014-06-10 DIAGNOSIS — H43393 Other vitreous opacities, bilateral: Secondary | ICD-10-CM | POA: Diagnosis not present

## 2014-06-10 DIAGNOSIS — Z961 Presence of intraocular lens: Secondary | ICD-10-CM | POA: Diagnosis not present

## 2014-07-02 ENCOUNTER — Other Ambulatory Visit: Payer: Self-pay | Admitting: Internal Medicine

## 2014-07-15 ENCOUNTER — Telehealth: Payer: Self-pay | Admitting: *Deleted

## 2014-07-15 DIAGNOSIS — G629 Polyneuropathy, unspecified: Secondary | ICD-10-CM

## 2014-07-15 MED ORDER — GABAPENTIN 300 MG PO CAPS: 300.0000 mg | ORAL_CAPSULE | Freq: Three times a day (TID) | ORAL | Status: DC

## 2014-07-15 NOTE — Telephone Encounter (Signed)
Dose increased to 300 mg ,  Continue 3 times daily not 4,  emg nerve conduction studies were advised but never ordered,  Would she like to proceed?

## 2014-07-15 NOTE — Telephone Encounter (Signed)
Pt called states she is taking Neurontin 100 mg TID however she states it is not effective.  Pt further states she has increased her medication to 4 times daily which is effective.  Pt is requesting her Rx be changed to 4 times daily.  Pt also request status of Nerve Conduction study order.  Please advise

## 2014-07-16 NOTE — Telephone Encounter (Signed)
Spoke with pt advised of medication dosing.  Pt verbalized understanding.  Pt further states she does want the Nerve Conduction Studies.  Please order

## 2014-07-16 NOTE — Telephone Encounter (Signed)
Referral is in process as requested to [facility] Neurology for EMG studies

## 2014-08-02 ENCOUNTER — Other Ambulatory Visit: Payer: Self-pay | Admitting: Internal Medicine

## 2014-08-03 NOTE — Telephone Encounter (Signed)
Last OV and refill 4.20.16.  Please advise refill

## 2014-08-05 ENCOUNTER — Other Ambulatory Visit: Payer: Self-pay | Admitting: Internal Medicine

## 2014-08-05 DIAGNOSIS — Z1231 Encounter for screening mammogram for malignant neoplasm of breast: Secondary | ICD-10-CM

## 2014-08-23 ENCOUNTER — Other Ambulatory Visit: Payer: Self-pay | Admitting: *Deleted

## 2014-08-23 DIAGNOSIS — R200 Anesthesia of skin: Secondary | ICD-10-CM

## 2014-08-30 ENCOUNTER — Other Ambulatory Visit: Payer: Self-pay | Admitting: Internal Medicine

## 2014-09-07 ENCOUNTER — Ambulatory Visit (INDEPENDENT_AMBULATORY_CARE_PROVIDER_SITE_OTHER): Payer: Medicare Other | Admitting: Neurology

## 2014-09-07 DIAGNOSIS — R200 Anesthesia of skin: Secondary | ICD-10-CM | POA: Diagnosis not present

## 2014-09-07 NOTE — Procedures (Signed)
[facility]  [place], [place]  [place], [place] [address] Tel: [phone] Fax:  [phone] Test Date:  09/07/2014  Patient: Dana Burke DOB: 1945-05-23 Physician: Narda Amber, DO  Sex: Female Height: 6' " Ref Phys: Dr Derrel Nip  ID#: [id number] Temp: 33.3C Technician: Jerilynn Mages. Dean   Patient Complaints: This is a 69 year old with chief complaint of numbness and tingling in feet bilaterally.  NCV & EMG Findings: Extensive electrodiagnostic testing of the right lower extremity and additional studies of the left shows:  1. Bilateral sural and superficial peroneal sensory responses are within normal limits. 2. Bilateral peroneal and tibial motor responses are within normal limits. 3. There is no evidence of active or chronic motor axon loss changes affecting any of the tested muscles. Motor unit configuration and recruitment pattern is within normal limits.  Impression: This is a normal study.   In particular, there is no evidence of a generalized sensorimotor polyneuropathy or lumbosacral radiculopathy affecting the legs. A small fiber neuropathy cannot be excluded by this study.   ___________________________ Narda Amber, DO    Nerve Conduction Studies Anti Sensory Summary Table   Site NR Peak (ms) Norm Peak (ms) P-T Amp (V) Norm P-T Amp  Left Sup Peroneal Anti Sensory (Ant Lat Mall)  33.3C  12 cm    3.3 <4.6 3.6 >3  Right Sup Peroneal Anti Sensory (Ant Lat Mall)  33.3C  12 cm    2.9 <4.6 5.3 >3  Left Sural Anti Sensory (Lat Mall)  33.3C  Calf    4.0 <4.6 4.1 >3  Right Sural Anti Sensory (Lat Mall)  33.3C  Calf    3.0 <4.6 4.5 >3   Motor Summary Table   Site NR Onset (ms) Norm Onset (ms) O-P Amp (mV) Norm O-P Amp Site1 Site2 Delta-0 (ms) Dist (cm) Vel (m/s) Norm Vel (m/s)  Left Peroneal Motor (Ext Dig Brev)  Ankle    3.8 <6.0 2.7 >2.5 B Fib Ankle 7.2 35.0 49 >40  B Fib    11.0  2.6  Poplt B Fib 2.1 10.0 48 >40  Poplt    13.1  2.5         Right  Peroneal Motor (Ext Dig Brev)  33.3C  Ankle    3.4 <6.0 3.4 >2.5 B Fib Ankle 6.9 33.0 48 >40  B Fib    10.3  3.2  Poplt B Fib 1.9 10.0 53 >40  Poplt    12.2  3.1         Left Tibial Motor (Abd Hall Brev)  Ankle    3.5 <6.0 8.6 >4 Knee Ankle 8.9 39.0 44 >40  Knee    12.4  5.0         Right Tibial Motor (Abd Hall Brev)  33.3C  Ankle    4.8 <6.0 5.9 >4 Knee Ankle 8.2 41.0 50 >40  Knee    13.0  5.1          H Reflex Studies   NR H-Lat (ms) Lat Norm (ms) L-R H-Lat (ms)  Right Tibial (Gastroc)  33.3C     34.15 <35 0.00   EMG   Side Muscle Ins Act Fibs Psw Fasc Number Recrt Dur Dur. Amp Amp. Poly Poly. Comment  Right AntTibialis Nml Nml Nml Nml Nml Nml Nml Nml Nml Nml Nml Nml N/A  Right Gastroc Nml Nml Nml Nml Nml Nml Nml Nml Nml Nml Nml Nml N/A  Right Flex Dig Long Nml Nml Nml  Nml Nml Nml Nml Nml Nml Nml Nml Nml N/A  Right RectFemoris Nml Nml Nml Nml Nml Nml Nml Nml Nml Nml Nml Nml N/A  Right GluteusMed Nml Nml Nml Nml Nml Nml Nml Nml Nml Nml Nml Nml N/A  Right BicepsFemS Nml Nml Nml Nml Nml Nml Nml Nml Nml Nml Nml Nml N/A  Left AntTibialis Nml Nml Nml Nml Nml Nml Nml Nml Nml Nml Nml Nml N/A  Left Gastroc Nml Nml Nml Nml Nml Nml Nml Nml Nml Nml Nml Nml N/A      Waveforms:

## 2014-09-09 ENCOUNTER — Ambulatory Visit
Admission: RE | Admit: 2014-09-09 | Discharge: 2014-09-09 | Disposition: A | Discharge status: Home / Self Care | Payer: Medicare Other | Source: Ambulatory Visit | Attending: Internal Medicine | Admitting: Internal Medicine

## 2014-09-09 DIAGNOSIS — Z1231 Encounter for screening mammogram for malignant neoplasm of breast: Secondary | ICD-10-CM

## 2014-09-29 ENCOUNTER — Telehealth: Payer: Self-pay | Admitting: *Deleted

## 2014-09-29 NOTE — Telephone Encounter (Signed)
Notified patient they are ready but patient prefers to have them mailed to her home.

## 2014-09-29 NOTE — Telephone Encounter (Signed)
Both have been printed and out in your red folder

## 2014-09-29 NOTE — Telephone Encounter (Signed)
Pt called requesting the results from a Nerve Conduction on 8.23.16 and mammogram from 8.16 to be mailed to her.  Please advise

## 2014-09-30 NOTE — Telephone Encounter (Signed)
Mailed results to patient

## 2014-10-29 DIAGNOSIS — Z23 Encounter for immunization: Secondary | ICD-10-CM | POA: Diagnosis not present

## 2014-11-04 ENCOUNTER — Ambulatory Visit: Payer: Medicare Other | Admitting: Internal Medicine

## 2014-11-08 ENCOUNTER — Other Ambulatory Visit: Payer: Self-pay | Admitting: Internal Medicine

## 2014-11-30 ENCOUNTER — Other Ambulatory Visit: Payer: Self-pay | Admitting: Internal Medicine

## 2015-01-21 ENCOUNTER — Ambulatory Visit: Payer: Medicare Other | Admitting: Internal Medicine

## 2015-03-14 ENCOUNTER — Ambulatory Visit (INDEPENDENT_AMBULATORY_CARE_PROVIDER_SITE_OTHER): Payer: Medicare Other | Admitting: Internal Medicine

## 2015-03-14 ENCOUNTER — Encounter: Payer: Self-pay | Admitting: Internal Medicine

## 2015-03-14 VITALS — BP 108/68 | HR 74 | Temp 98.1°F | Resp 12 | Ht 70.0 in | Wt 239.2 lb

## 2015-03-14 DIAGNOSIS — E785 Hyperlipidemia, unspecified: Secondary | ICD-10-CM | POA: Diagnosis not present

## 2015-03-14 DIAGNOSIS — G629 Polyneuropathy, unspecified: Secondary | ICD-10-CM | POA: Diagnosis not present

## 2015-03-14 DIAGNOSIS — F321 Major depressive disorder, single episode, moderate: Secondary | ICD-10-CM

## 2015-03-14 DIAGNOSIS — E669 Obesity, unspecified: Secondary | ICD-10-CM

## 2015-03-14 LAB — COMPREHENSIVE METABOLIC PANEL
ALBUMIN: 4.2 g/dL (ref 3.5–5.2)
ALT: 26 U/L (ref 0–35)
AST: 25 U/L (ref 0–37)
Alkaline Phosphatase: 68 U/L (ref 39–117)
BUN: 8 mg/dL (ref 6–23)
CHLORIDE: 104 meq/L (ref 96–112)
CO2: 27 mEq/L (ref 19–32)
Calcium: 10.4 mg/dL (ref 8.4–10.5)
Creatinine, Ser: 0.71 mg/dL (ref 0.40–1.20)
GFR: 86.02 mL/min (ref >60.00)
Glucose, Bld: 86 mg/dL (ref 70–99)
Potassium: 4.4 mEq/L (ref 3.5–5.1)
Sodium: 138 mEq/L (ref 135–145)
TOTAL PROTEIN: 7 g/dL (ref 6.0–8.3)
Total Bilirubin: 0.5 mg/dL (ref 0.2–1.2)

## 2015-03-14 LAB — LIPID PANEL
CHOLESTEROL: 156 mg/dL (ref 0–200)
HDL: 32.2 mg/dL — AB (ref >39.00)
LDL Cholesterol: 104 mg/dL — ABNORMAL HIGH (ref 0–99)
NonHDL: 123.76
TRIGLYCERIDES: 101 mg/dL (ref 0.0–149.0)
Total CHOL/HDL Ratio: 5
VLDL: 20.2 mg/dL (ref 0.0–40.0)

## 2015-03-14 MED ORDER — BUPROPION HCL ER (XL) 300 MG PO TB24: 300.0000 mg | ORAL_TABLET | Freq: Every day | ORAL | Status: DC

## 2015-03-14 MED ORDER — GABAPENTIN 300 MG PO CAPS: 300.0000 mg | ORAL_CAPSULE | Freq: Four times a day (QID) | ORAL | Status: DC

## 2015-03-14 NOTE — Patient Instructions (Addendum)
Congratulations on the weight loss!!  When you need to transition off of th [place] diet:    The  diet I discussed with you today is the 10 day Green Smoothie Cleansing /Detox Diet by Linden Dolin . available on [place] for around $10.  It does require a blender, (Vita Mix, a electric juicer,  Or a Nutribullet Rx).  This is not a low carb or a weight loss diet,  It is fundamentally a "cleansing" low fat diet that eliminates sugar, gluten, caffeine, alcohol and dairy for 10 days .  What you add back after the initial ten days is entirely up to  you!  You can expect to lose 5 to 10 lbs depending on how strict you are.   I found that  drinking 2 smoothies or juices  daily and keeping one chewable meal (but keep it simple, like baked fish and salad, rice or bok choy) kept me satisfied and kept me from straying  .  You snack primarily on fresh  fruit, egg whites and judicious quantities of nuts.  You can add a  vegetable based protein powder  to any smoothie made with almond milk (nothing with whey , since whey is dairy)  [employer] has a few but  the [employer] has the greatest  selection .  Using frozen fruits is much more convenient and cost effective. You can even find plenty of organic fruit in the frozen fruit section of [employer].  Just thaw what you need for the following day the night before in the refrigerator (to avoid jamming up your machine)   The organic vegan protein powder I tried  is called Vega" and I found it at [employer] .  It is sugar free. Tastes like crap.  My advice:  Sarina Ser your protein  (eat an egg or two in the am with your smoothie or add soy yogurt for protein ) ,  Don't ruin the taste of your smoothies with protein powder unless you can find one you really love.   Add 30 minutes  of walking DAILY    For your leg pain:  Increase your neurontin to 600 mg at bedtime only

## 2015-03-14 NOTE — Progress Notes (Signed)
Pre-visit discussion using our clinic review tool. No additional management support is needed unless otherwise documented below in the visit note.  

## 2015-03-14 NOTE — Progress Notes (Signed)
Subjective:  Patient ID: Dana Burke, female    DOB: 01-30-1945  Age: 70 y.o. MRN: [id number]  CC: The primary encounter diagnosis was Obesity. Diagnoses of Hyperlipidemia, Neuropathy ([place]), Major depressive disorder, single episode, moderate ([place]), and Hyperlipidemia LDL goal <130 were also pertinent to this visit.  HPI Dana Burke presents for follow up on depression,  Neuropathy and obesity  Has lost 23 lbs  And 20 inches since April  By joining [employer] Medically Supervised Diet in [place].  Low fat,  Low carb diet.  Using human growth hormone sublingual tablets (appetite suppressant , stimulate weight loss,) for 6 days per week  And diet 7 days/week  .  Induction phase: 2 eggs, /toast.  Lunch is meat,  veg and fruit for lunch and supper. 1 gallon water,  MVI's.  No pork.  mostly green vegetables and cruciferous. Drinks Benefiber in the morning.  And a probiotic pil three times daily (goal is 2-3 soft stools daily)   Stopped simvastatin after last visit due to muscle aches  More tearful. And more anxious the last 6 months,  Not sure what the trigger is.  Husband died 3 years ago. Wants to increase the wellbutrin dose. .    Legs have been aching with a dull pain that is waking her up.  Feel asleep . Taking neurontin 300 mg tid       Outpatient Prescriptions Prior to Visit  Medication Sig Dispense Refill  . aspirin 81 MG tablet Take 81 mg by mouth daily.    . calcium carbonate (OS-CAL) 600 MG TABS Take 600 mg by mouth 2 (two) times daily with a meal.    . cetirizine (ZYRTEC) 10 MG tablet Take 10 mg by mouth daily.    . Cholecalciferol (VITAMIN D-3) 1000 UNITS CAPS Take 1 capsule by mouth daily.    . Ibuprofen-Diphenhydramine HCl (ADVIL PM) 200-25 MG CAPS Take 2 capsules by mouth at bedtime.    . Multiple Vitamins-Minerals (MULTIVITAMIN WITH MINERALS) tablet Take 1 tablet by mouth daily.    [name] omeprazole (PRILOSEC) 20 MG capsule Take 20 mg by mouth daily.    [name] buPROPion (WELLBUTRIN  XL) 150 MG 24 hr tablet TAKE 1 TABLET BY MOUTH ONCE DAILY. 90 tablet 2  . gabapentin (NEURONTIN) 300 MG capsule TAKE 1 CAPSULE BY MOUTH 3 TIMES DAILY. 90 capsule 2  . simvastatin (ZOCOR) 20 MG tablet TAKE 1 TABLET BY MOUTH EVERY NIGHT AT BEDTIME. (Patient not taking: Reported on 05/31/2014) 90 tablet 1   No facility-administered medications prior to visit.    Review of Systems;  Patient denies headache, fevers, malaise, unintentional weight loss, skin rash, eye pain, sinus congestion and sinus pain, sore throat, dysphagia,  hemoptysis , cough, dyspnea, wheezing, chest pain, palpitations, orthopnea, edema, abdominal pain, nausea, melena, diarrhea, constipation, flank pain, dysuria, hematuria, urinary  Frequency, nocturia, numbness, tingling, seizures,  Focal weakness, Loss of consciousness,  Tremor, insomnia, depression, anxiety, and suicidal ideation.      Objective:  BP 108/68 mmHg  Pulse 74  Temp(Src) 98.1 F (36.7 C) (Oral)  Resp 12  Ht 5\' 10"  (1.778 m)  Wt 239 lb 4 oz (108.523 kg)  BMI 34.33 kg/m2  SpO2 98%  BP Readings from Last 3 Encounters:  03/14/15 108/68  05/31/14 118/74  05/05/14 116/72    Wt Readings from Last 3 Encounters:  03/14/15 239 lb 4 oz (108.523 kg)  05/31/14 251 lb (113.853 kg)  05/05/14 254 lb 8 oz (115.44 kg)  General appearance: alert, cooperative and appears stated age Ears: normal TM's and external ear canals both ears Throat: lips, mucosa, and tongue normal; teeth and gums normal Neck: no adenopathy, no carotid bruit, supple, symmetrical, trachea midline and thyroid not enlarged, symmetric, no tenderness/mass/nodules Back: symmetric, no curvature. ROM normal. No CVA tenderness. Lungs: clear to auscultation bilaterally Heart: regular rate and rhythm, S1, S2 normal, no murmur, click, rub or gallop Abdomen: soft, non-tender; bowel sounds normal; no masses,  no organomegaly Pulses: 2+ and symmetric Skin: Skin color, texture, turgor normal. No  rashes or lesions Lymph nodes: Cervical, supraclavicular, and axillary nodes normal.  Lab Results  Component Value Date   HGBA1C 5.9 05/31/2014    Lab Results  Component Value Date   CREATININE 0.71 03/14/2015   CREATININE 0.78 05/05/2014   CREATININE 0.7 07/06/2013    Lab Results  Component Value Date   WBC 5.1 05/31/2014   HGB 14.0 05/31/2014   HCT 41.3 05/31/2014   PLT 196.0 05/31/2014   GLUCOSE 86 03/14/2015   CHOL 156 03/14/2015   TRIG 101.0 03/14/2015   HDL 32.20* 03/14/2015   LDLDIRECT 102.2 07/06/2013   LDLCALC 104* 03/14/2015   ALT 26 03/14/2015   AST 25 03/14/2015   NA 138 03/14/2015   K 4.4 03/14/2015   CL 104 03/14/2015   CREATININE 0.71 03/14/2015   BUN 8 03/14/2015   CO2 27 03/14/2015   TSH 1.10 06/01/2014   HGBA1C 5.9 05/31/2014    Mm Screening Breast Tomo Bilateral  09/13/2014  CLINICAL DATA:  Screening. EXAM: DIGITAL SCREENING BILATERAL MAMMOGRAM WITH 3D TOMO WITH CAD COMPARISON:  Previous exam(s). ACR Breast Density Category b: There are scattered areas of fibroglandular density. FINDINGS: There are no findings suspicious for malignancy. Images were processed with CAD. IMPRESSION: No mammographic evidence of malignancy. A result letter of this screening mammogram will be mailed directly to the patient. RECOMMENDATION: Screening mammogram in one year. (Code:SM-B-01Y) BI-RADS CATEGORY  1: Negative. Electronically Signed   By: Altamese Cabal M.D.   On: 09/13/2014 13:40    Assessment & Plan:   Problem List Items Addressed This Visit    Obesity - Primary    I have congratulated her in reduction of   BMI and encouraged  Continued weight loss with goal of 10% of body weight over the next 6 months using a low glycemic index diet and regular exercise a minimum of 5 days per week.        Relevant Orders   Lipid panel (Completed)   Hyperlipidemia LDL goal <130    Did not tolerate simvastatin.  Untreated lipid panel is normal except for low HDL. Lab  Results  Component Value Date   CHOL 156 03/14/2015   HDL 32.20* 03/14/2015   LDLCALC 104* 03/14/2015   LDLDIRECT 102.2 07/06/2013   TRIG 101.0 03/14/2015   CHOLHDL 5 03/14/2015         Major depressive disorder, single episode    tolerated Wellbutrin,  Increasing dose to 300 mg daily       Relevant Medications   buPROPion (WELLBUTRIN XL) 300 MG 24 hr tablet   Neuropathy ([facility])    Exam was normal,  screening for thyroid, B12 deficiencies,  Syphilis and DM  negative. Will Increase gabapentin to 600 mg at bedtime         Other Visit Diagnoses    Hyperlipidemia        Relevant Orders    Comprehensive metabolic panel (Completed)  A total of 25 minutes of face to face time was spent with patient more than half of which was spent in counselling and coordination of care   I have changed Ms. Carpenito's buPROPion and gabapentin. I am also having her maintain her omeprazole, Ibuprofen-Diphenhydramine HCl, aspirin, multivitamin with minerals, calcium carbonate, Vitamin D-3, cetirizine, and simvastatin.  Meds ordered this encounter  Medications  . buPROPion (WELLBUTRIN XL) 300 MG 24 hr tablet    Sig: Take 1 tablet (300 mg total) by mouth daily.    Dispense:  30 tablet    Refill:  2  . gabapentin (NEURONTIN) 300 MG capsule    Sig: Take 1 capsule (300 mg total) by mouth 4 (four) times daily.    Dispense:  120 capsule    Refill:  2    Medications Discontinued During This Encounter  Medication Reason  . buPROPion (WELLBUTRIN XL) 150 MG 24 hr tablet Reorder  . gabapentin (NEURONTIN) 300 MG capsule Reorder    Follow-up: No Follow-up on file.   Crecencio Mc, MD

## 2015-03-15 ENCOUNTER — Encounter: Payer: Self-pay | Admitting: *Deleted

## 2015-03-15 NOTE — Assessment & Plan Note (Signed)
tolerated Wellbutrin,  Increasing dose to 300 mg daily

## 2015-03-15 NOTE — Assessment & Plan Note (Signed)
Exam was normal,  screening for thyroid, B12 deficiencies,  Syphilis and DM  negative. Will Increase gabapentin to 600 mg at bedtime   

## 2015-03-15 NOTE — Assessment & Plan Note (Signed)
I have congratulated her in reduction of   BMI and encouraged  Continued weight loss with goal of 10% of body weight over the next 6 months using a low glycemic index diet and regular exercise a minimum of 5 days per week.     

## 2015-03-15 NOTE — Assessment & Plan Note (Signed)
Did not tolerate simvastatin.  Untreated lipid panel is normal except for low HDL. Lab Results  Component Value Date   CHOL 156 03/14/2015   HDL 32.20* 03/14/2015   LDLCALC 104* 03/14/2015   LDLDIRECT 102.2 07/06/2013   TRIG 101.0 03/14/2015   CHOLHDL 5 03/14/2015

## 2015-05-02 ENCOUNTER — Other Ambulatory Visit: Payer: Self-pay | Admitting: Internal Medicine

## 2015-05-02 DIAGNOSIS — Z780 Asymptomatic menopausal state: Secondary | ICD-10-CM

## 2015-05-10 ENCOUNTER — Encounter: Payer: Self-pay | Admitting: Internal Medicine

## 2015-05-26 ENCOUNTER — Other Ambulatory Visit: Payer: Self-pay | Admitting: Internal Medicine

## 2015-05-30 ENCOUNTER — Ambulatory Visit
Admission: RE | Admit: 2015-05-30 | Discharge: 2015-05-30 | Disposition: A | Discharge status: Home / Self Care | Payer: Medicare Other | Source: Ambulatory Visit | Attending: Internal Medicine | Admitting: Internal Medicine

## 2015-05-30 DIAGNOSIS — M8580 Other specified disorders of bone density and structure, unspecified site: Secondary | ICD-10-CM | POA: Diagnosis not present

## 2015-05-30 DIAGNOSIS — M8588 Other specified disorders of bone density and structure, other site: Secondary | ICD-10-CM | POA: Diagnosis not present

## 2015-05-30 DIAGNOSIS — Z13820 Encounter for screening for osteoporosis: Secondary | ICD-10-CM | POA: Insufficient documentation

## 2015-05-30 DIAGNOSIS — Z780 Asymptomatic menopausal state: Secondary | ICD-10-CM

## 2015-06-02 ENCOUNTER — Encounter: Payer: Self-pay | Admitting: *Deleted

## 2015-06-03 ENCOUNTER — Telehealth: Payer: Self-pay | Admitting: *Deleted

## 2015-06-03 ENCOUNTER — Telehealth: Payer: Self-pay | Admitting: Internal Medicine

## 2015-06-03 DIAGNOSIS — J029 Acute pharyngitis, unspecified: Secondary | ICD-10-CM | POA: Diagnosis not present

## 2015-06-03 DIAGNOSIS — L723 Sebaceous cyst: Secondary | ICD-10-CM | POA: Diagnosis not present

## 2015-06-03 NOTE — Telephone Encounter (Signed)
FYI Pt called in for an appt, she has a severe sore throat, with SOB. She was transferred to the nurse line.

## 2015-06-03 NOTE — Telephone Encounter (Signed)
[facility] Patient Name: Dana Burke DOB: 1945-12-29 Initial Comment [name] Nurse Assessment Nurse: Dimas Chyle, RN, Dellis Filbert Date/Time Eilene Ghazi Time): 06/03/2015 11:18:47 AM Confirm and document reason for call. If symptomatic, describe symptoms. You must click the next button to save text entered. ---[name]. Started last night. No fever. Has the patient traveled out of the country within the last 30 days? ---No Does the patient have any new or worsening symptoms? ---Yes Will a triage be completed? ---Yes Related visit to physician within the last 2 weeks? ---No Does the PT have any chronic conditions? (i.e. diabetes, asthma, etc.) ---No Is this a behavioral health or substance abuse call? ---No Guidelines Guideline Title Affirmed Question Affirmed Notes Sore Throat [1] Difficulty breathing AND [2] not severe Final Disposition User Go to ED Now (or PCP triage) Dimas Chyle, RN, Dellis Filbert Referrals GO TO FACILITY UNDECIDED Disagree/Comply: Leta Baptist

## 2015-06-03 NOTE — Telephone Encounter (Signed)
cancel

## 2015-06-03 NOTE — Telephone Encounter (Signed)
Spoke with patient and she was evaluated and treated at a walk-in clinic.  She has been diagnosed with a sublingual gland infection.  She has a prescription for prednisone and Augmentin.  The walk-in clinic will refer her to an ENT.

## 2015-06-07 ENCOUNTER — Other Ambulatory Visit: Payer: Self-pay | Admitting: Internal Medicine

## 2015-06-08 ENCOUNTER — Other Ambulatory Visit: Payer: Self-pay | Admitting: Otolaryngology

## 2015-06-08 DIAGNOSIS — R221 Localized swelling, mass and lump, neck: Secondary | ICD-10-CM

## 2015-06-08 DIAGNOSIS — K219 Gastro-esophageal reflux disease without esophagitis: Secondary | ICD-10-CM | POA: Diagnosis not present

## 2015-06-08 DIAGNOSIS — R490 Dysphonia: Secondary | ICD-10-CM | POA: Diagnosis not present

## 2015-06-16 ENCOUNTER — Telehealth: Payer: Self-pay | Admitting: Internal Medicine

## 2015-06-16 ENCOUNTER — Ambulatory Visit
Admission: RE | Admit: 2015-06-16 | Discharge: 2015-06-16 | Disposition: A | Discharge status: Home / Self Care | Payer: Medicare Other | Source: Ambulatory Visit | Attending: Otolaryngology | Admitting: Otolaryngology

## 2015-06-16 DIAGNOSIS — R221 Localized swelling, mass and lump, neck: Secondary | ICD-10-CM

## 2015-06-16 LAB — POCT I-STAT CREATININE: CREATININE: 1 mg/dL (ref 0.44–1.00)

## 2015-06-16 MED ORDER — IOPAMIDOL (ISOVUE-300) INJECTION 61%
75.0000 mL | Freq: Once | INTRAVENOUS | Status: AC | PRN
  Administered 2015-06-16: 75 mL via INTRAVENOUS

## 2015-06-16 NOTE — Telephone Encounter (Signed)
Patient would like an appt to discuss, please advise for appt time and date.

## 2015-06-16 NOTE — Telephone Encounter (Signed)
The report from radiology suggesta   nonmalignant mass, called a thyroglossal duct cyst.  Since the CT was ordered bvy ENt,  And since she was referred to ENT by another internist at [facility], not me..  I  do not have the report from Dr Richardson Landry, the ENT doctor.  So JI cannot give her any more information without his office noted.  Therefore June 9th is the best I can do, because we will have to get her to sign a release form in order to get  the office visit note from dr Richardson Landry.

## 2015-06-16 NOTE — Telephone Encounter (Signed)
Pt called about having a mass in her throat and the way the doctor is talking it could possibly be maligant. Pt would like some suggestions from Dr Derrel Nip. Pt would like to come in but the next available appt is not until 06/24/15.  Let me know the next avail appt to sch pt.    Call pt @ [phone]. Thank you!

## 2015-06-16 NOTE — Telephone Encounter (Signed)
Please call and schedule thanks  

## 2015-06-17 NOTE — Telephone Encounter (Signed)
Pt is scheduled for 06/09 @ 3pm. Pt understood.

## 2015-06-17 NOTE — Telephone Encounter (Signed)
Please advise patient that we are going to ned to get records so that is the earliest appt we can give her. thanks

## 2015-06-20 ENCOUNTER — Encounter
Admission: RE | Admit: 2015-06-20 | Discharge: 2015-06-20 | Disposition: A | Discharge status: Home / Self Care | Payer: Medicare Other | Source: Ambulatory Visit | Attending: Otolaryngology | Admitting: Otolaryngology

## 2015-06-20 DIAGNOSIS — Z9049 Acquired absence of other specified parts of digestive tract: Secondary | ICD-10-CM | POA: Diagnosis not present

## 2015-06-20 DIAGNOSIS — K219 Gastro-esophageal reflux disease without esophagitis: Secondary | ICD-10-CM | POA: Diagnosis not present

## 2015-06-20 DIAGNOSIS — Z6831 Body mass index (BMI) 31.0-31.9, adult: Secondary | ICD-10-CM | POA: Diagnosis not present

## 2015-06-20 DIAGNOSIS — Q892 Congenital malformations of other endocrine glands: Secondary | ICD-10-CM | POA: Diagnosis not present

## 2015-06-20 DIAGNOSIS — Z87891 Personal history of nicotine dependence: Secondary | ICD-10-CM | POA: Diagnosis not present

## 2015-06-20 DIAGNOSIS — I10 Essential (primary) hypertension: Secondary | ICD-10-CM | POA: Diagnosis not present

## 2015-06-20 DIAGNOSIS — E669 Obesity, unspecified: Secondary | ICD-10-CM | POA: Diagnosis not present

## 2015-06-20 DIAGNOSIS — F329 Major depressive disorder, single episode, unspecified: Secondary | ICD-10-CM | POA: Diagnosis not present

## 2015-06-20 DIAGNOSIS — Z7982 Long term (current) use of aspirin: Secondary | ICD-10-CM | POA: Diagnosis not present

## 2015-06-20 DIAGNOSIS — Z833 Family history of diabetes mellitus: Secondary | ICD-10-CM | POA: Diagnosis not present

## 2015-06-20 DIAGNOSIS — Z9849 Cataract extraction status, unspecified eye: Secondary | ICD-10-CM | POA: Diagnosis not present

## 2015-06-20 DIAGNOSIS — Z79899 Other long term (current) drug therapy: Secondary | ICD-10-CM | POA: Diagnosis not present

## 2015-06-20 DIAGNOSIS — Z01810 Encounter for preprocedural cardiovascular examination: Secondary | ICD-10-CM | POA: Diagnosis not present

## 2015-06-20 LAB — CBC
HCT: 41.8 % (ref 35.0–47.0)
HEMOGLOBIN: 13.8 g/dL (ref 12.0–16.0)
MCH: 30.3 pg (ref 26.0–34.0)
MCHC: 33 g/dL (ref 32.0–36.0)
MCV: 91.7 fL (ref 80.0–100.0)
PLATELETS: 193 10*3/uL (ref 150–440)
RBC: 4.56 MIL/uL (ref 3.80–5.20)
RDW: 13.7 % (ref 11.5–14.5)
WBC: 5.4 10*3/uL (ref 3.6–11.0)

## 2015-06-20 NOTE — Patient Instructions (Signed)
Your procedure is scheduled on: Wednesday 06/22/15  Report to Day Surgery. 2ND FLOOR MEDICAL MALL ENTRANCE To find out your arrival time please call [phone] between 1PM - 3PM on Tuesday 06/21/15.  Remember: Instructions that are not followed completely may result in serious medical risk, up to and including death, or upon the discretion of your surgeon and anesthesiologist your surgery may need to be rescheduled.    __X__ 1. Do not eat food or drink liquids after midnight. No gum chewing or hard candies.     __X__ 2. No Alcohol for 24 hours before or after surgery.   ____ 3. Bring all medications with you on the day of surgery if instructed.    __X__ 4. Notify your doctor if there is any change in your medical condition     (cold, fever, infections).     Do not wear jewelry, make-up, hairpins, clips or nail polish.  Do not wear lotions, powders, or perfumes.   Do not shave 48 hours prior to surgery. Men may shave face and neck.  Do not bring valuables to the hospital.    [facility] is not responsible for any belongings or valuables.               Contacts, dentures or bridgework may not be worn into surgery.  Leave your suitcase in the car. After surgery it may be brought to your room.  For patients admitted to the hospital, discharge time is determined by your                treatment team.   Patients discharged the day of surgery will not be allowed to drive home.   Please read over the following fact sheets that you were given:   Surgical Site Infection Prevention   __X__ Take these medicines the morning of surgery with A SIP OF WATER:    1. GABAPENTIN  2. WELLBUTRIN  3. OMEPRAZOLE  4.  5.  6.  ____ Fleet Enema (as directed)   __X__ Use CHG Soap as directed  ____ Use inhalers on the day of surgery  ____ Stop metformin 2 days prior to surgery    ____ Take 1/2 of usual insulin dose the night before surgery and none on the morning of surgery.   __X__ Stop  Coumadin/Plavix/aspirin on ASPIRIN STOPPED 06/17/15  __X__ Stop Anti-inflammatories on NO ADVIL PM MAY TAKE TYLENOL PM   ____ Stop supplements until after surgery.    ____ Bring C-Pap to the hospital.

## 2015-06-21 ENCOUNTER — Telehealth: Payer: Self-pay | Admitting: Internal Medicine

## 2015-06-21 NOTE — Pre-Procedure Instructions (Signed)
EKG OK BY DR [place] NOTIFIED

## 2015-06-21 NOTE — Telephone Encounter (Signed)
Clearance letter written

## 2015-06-22 ENCOUNTER — Ambulatory Visit: Payer: Medicare Other | Admitting: Anesthesiology

## 2015-06-22 ENCOUNTER — Observation Stay
Admission: RE | Admit: 2015-06-22 | Discharge: 2015-06-23 | Disposition: A | Discharge status: Home / Self Care | Payer: Medicare Other | Source: Ambulatory Visit | Attending: Otolaryngology | Admitting: Otolaryngology

## 2015-06-22 ENCOUNTER — Encounter
Admission: RE | Disposition: A | Discharge status: Home / Self Care | Payer: Self-pay | Source: Ambulatory Visit | Attending: Otolaryngology

## 2015-06-22 ENCOUNTER — Encounter: Payer: Self-pay | Admitting: *Deleted

## 2015-06-22 DIAGNOSIS — Z833 Family history of diabetes mellitus: Secondary | ICD-10-CM | POA: Diagnosis not present

## 2015-06-22 DIAGNOSIS — Z79899 Other long term (current) drug therapy: Secondary | ICD-10-CM | POA: Diagnosis not present

## 2015-06-22 DIAGNOSIS — Z87891 Personal history of nicotine dependence: Secondary | ICD-10-CM | POA: Insufficient documentation

## 2015-06-22 DIAGNOSIS — Q892 Congenital malformations of other endocrine glands: Secondary | ICD-10-CM | POA: Diagnosis not present

## 2015-06-22 DIAGNOSIS — F329 Major depressive disorder, single episode, unspecified: Secondary | ICD-10-CM | POA: Insufficient documentation

## 2015-06-22 DIAGNOSIS — Z9049 Acquired absence of other specified parts of digestive tract: Secondary | ICD-10-CM | POA: Diagnosis not present

## 2015-06-22 DIAGNOSIS — E669 Obesity, unspecified: Secondary | ICD-10-CM | POA: Insufficient documentation

## 2015-06-22 DIAGNOSIS — Z9849 Cataract extraction status, unspecified eye: Secondary | ICD-10-CM | POA: Insufficient documentation

## 2015-06-22 DIAGNOSIS — Z7982 Long term (current) use of aspirin: Secondary | ICD-10-CM | POA: Insufficient documentation

## 2015-06-22 DIAGNOSIS — K219 Gastro-esophageal reflux disease without esophagitis: Secondary | ICD-10-CM | POA: Insufficient documentation

## 2015-06-22 DIAGNOSIS — Z6831 Body mass index (BMI) 31.0-31.9, adult: Secondary | ICD-10-CM | POA: Insufficient documentation

## 2015-06-22 HISTORY — PX: THYROGLOSSAL DUCT CYST: SHX297

## 2015-06-22 HISTORY — DX: Congenital malformations of other endocrine glands: Q89.2

## 2015-06-22 SURGERY — EXCISION, THYROGLOSSAL DUCT CYST
Anesthesia: General | Wound class: Clean

## 2015-06-22 MED ORDER — DEXAMETHASONE SODIUM PHOSPHATE 10 MG/ML IJ SOLN
INTRAMUSCULAR | Status: DC | PRN
  Administered 2015-06-22: 10 mg via INTRAVENOUS

## 2015-06-22 MED ORDER — KCL IN DEXTROSE-NACL 20-5-0.45 MEQ/L-%-% IV SOLN
INTRAVENOUS | Status: DC
  Administered 2015-06-22: 22:00:00 via INTRAVENOUS
  Filled 2015-06-22 (×6): qty 1000

## 2015-06-22 MED ORDER — HYDROCODONE-ACETAMINOPHEN 5-325 MG PO TABS
1.0000 | ORAL_TABLET | ORAL | Status: DC | PRN
  Administered 2015-06-22: 1 via ORAL
  Filled 2015-06-22: qty 1

## 2015-06-22 MED ORDER — BACITRACIN 500 UNIT/GM EX OINT
TOPICAL_OINTMENT | CUTANEOUS | Status: DC | PRN
  Administered 2015-06-22: 1 via TOPICAL

## 2015-06-22 MED ORDER — SUCCINYLCHOLINE CHLORIDE 20 MG/ML IJ SOLN
INTRAMUSCULAR | Status: DC | PRN
  Administered 2015-06-22: 100 mg via INTRAVENOUS

## 2015-06-22 MED ORDER — HYDROMORPHONE HCL 1 MG/ML IJ SOLN
0.2500 mg | INTRAMUSCULAR | Status: DC | PRN
  Administered 2015-06-22 (×4): 0.25 mg via INTRAVENOUS

## 2015-06-22 MED ORDER — LACTATED RINGERS IV SOLN
INTRAVENOUS | Status: DC
  Administered 2015-06-22 (×3): via INTRAVENOUS

## 2015-06-22 MED ORDER — FENTANYL CITRATE (PF) 100 MCG/2ML IJ SOLN
25.0000 ug | INTRAMUSCULAR | Status: AC | PRN
Start: 2015-06-22 — End: 2015-06-22
  Administered 2015-06-22 (×6): 25 ug via INTRAVENOUS

## 2015-06-22 MED ORDER — BACITRACIN ZINC 500 UNIT/GM EX OINT
TOPICAL_OINTMENT | CUTANEOUS | Status: AC
  Filled 2015-06-22: qty 28.35

## 2015-06-22 MED ORDER — FENTANYL CITRATE (PF) 100 MCG/2ML IJ SOLN
INTRAMUSCULAR | Status: AC
  Administered 2015-06-22: 25 ug via INTRAVENOUS
  Filled 2015-06-22: qty 2

## 2015-06-22 MED ORDER — DOCUSATE SODIUM 100 MG PO CAPS: 100.0000 mg | ORAL_CAPSULE | Freq: Two times a day (BID) | ORAL | Status: DC

## 2015-06-22 MED ORDER — BACITRACIN ZINC 500 UNIT/GM EX OINT
1.0000 "application " | TOPICAL_OINTMENT | Freq: Three times a day (TID) | CUTANEOUS | Status: DC
  Administered 2015-06-22 – 2015-06-23 (×2): 1 via TOPICAL
  Filled 2015-06-22: qty 28.35

## 2015-06-22 MED ORDER — PROPOFOL 10 MG/ML IV BOLUS
INTRAVENOUS | Status: DC | PRN
  Administered 2015-06-22: 180 mg via INTRAVENOUS

## 2015-06-22 MED ORDER — VITAMIN D 1000 UNITS PO TABS: 1000.0000 [IU] | ORAL_TABLET | Freq: Two times a day (BID) | ORAL | Status: DC

## 2015-06-22 MED ORDER — ONDANSETRON HCL 4 MG/2ML IJ SOLN
INTRAMUSCULAR | Status: DC | PRN
  Administered 2015-06-22: 4 mg via INTRAVENOUS

## 2015-06-22 MED ORDER — HYDROMORPHONE HCL 1 MG/ML IJ SOLN
INTRAMUSCULAR | Status: AC
  Administered 2015-06-22: 0.25 mg via INTRAVENOUS
  Filled 2015-06-22: qty 1

## 2015-06-22 MED ORDER — GABAPENTIN 300 MG PO CAPS
300.0000 mg | ORAL_CAPSULE | Freq: Four times a day (QID) | ORAL | Status: DC
  Administered 2015-06-22: 300 mg via ORAL
  Filled 2015-06-22: qty 1

## 2015-06-22 MED ORDER — LIDOCAINE 2% (20 MG/ML) 5 ML SYRINGE
INTRAMUSCULAR | Status: DC | PRN
  Administered 2015-06-22: 100 mg via INTRAVENOUS

## 2015-06-22 MED ORDER — MORPHINE SULFATE (PF) 2 MG/ML IV SOLN: 2.0000 mg | INTRAVENOUS | Status: DC | PRN

## 2015-06-22 MED ORDER — ONDANSETRON HCL 4 MG PO TABS: 4.0000 mg | ORAL_TABLET | ORAL | Status: DC | PRN

## 2015-06-22 MED ORDER — PANTOPRAZOLE SODIUM 40 MG PO TBEC: 40.0000 mg | DELAYED_RELEASE_TABLET | Freq: Every day | ORAL | Status: DC

## 2015-06-22 MED ORDER — LIDOCAINE-EPINEPHRINE (PF) 1 %-1:200000 IJ SOLN
INTRAMUSCULAR | Status: DC | PRN
  Administered 2015-06-22: 3 mL

## 2015-06-22 MED ORDER — LIDOCAINE-EPINEPHRINE (PF) 1 %-1:200000 IJ SOLN
INTRAMUSCULAR | Status: AC
  Filled 2015-06-22: qty 30

## 2015-06-22 MED ORDER — ROCURONIUM BROMIDE 100 MG/10ML IV SOLN
INTRAVENOUS | Status: DC | PRN
  Administered 2015-06-22: 15 mg via INTRAVENOUS
  Administered 2015-06-22: 5 mg via INTRAVENOUS

## 2015-06-22 MED ORDER — SUGAMMADEX SODIUM 200 MG/2ML IV SOLN
INTRAVENOUS | Status: DC | PRN
  Administered 2015-06-22: 208.6 mg via INTRAVENOUS

## 2015-06-22 MED ORDER — ONDANSETRON HCL 4 MG/2ML IJ SOLN: 4.0000 mg | Freq: Once | INTRAMUSCULAR | Status: DC | PRN

## 2015-06-22 MED ORDER — BUPROPION HCL ER (XL) 150 MG PO TB24: 300.0000 mg | ORAL_TABLET | Freq: Every day | ORAL | Status: DC

## 2015-06-22 MED ORDER — FENTANYL CITRATE (PF) 100 MCG/2ML IJ SOLN
INTRAMUSCULAR | Status: DC | PRN
  Administered 2015-06-22: 150 ug via INTRAVENOUS
  Administered 2015-06-22 (×2): 50 ug via INTRAVENOUS

## 2015-06-22 MED ORDER — PHENYLEPHRINE HCL 10 MG/ML IJ SOLN
INTRAMUSCULAR | Status: DC | PRN
  Administered 2015-06-22 (×4): 100 ug via INTRAVENOUS

## 2015-06-22 MED ORDER — ONDANSETRON HCL 4 MG/2ML IJ SOLN: 4.0000 mg | INTRAMUSCULAR | Status: DC | PRN

## 2015-06-22 SURGICAL SUPPLY — 30 items
BLADE SURG 15 STRL LF DISP TIS (BLADE) ×1 IMPLANT
BLADE SURG 15 STRL SS (BLADE) ×2
CANISTER SUCT 1200ML W/VALVE (MISCELLANEOUS) ×3 IMPLANT
CORD BIP STRL DISP 12FT (MISCELLANEOUS) ×3 IMPLANT
DRAIN TLS ROUND 10FR (DRAIN) ×3 IMPLANT
DRAPE MAG INST 16X20 L/F (DRAPES) ×3 IMPLANT
DRSG TEGADERM 2-3/8X2-3/4 SM (GAUZE/BANDAGES/DRESSINGS) ×3 IMPLANT
ELECT CAUTERY BLADE TIP 2.5 (TIP) ×3
ELECT NEEDLE 20X.3 GREEN (MISCELLANEOUS)
ELECT REM PT RETURN 9FT ADLT (ELECTROSURGICAL) ×3
ELECTRODE CAUTERY BLDE TIP 2.5 (TIP) ×1 IMPLANT
ELECTRODE NEEDLE 20X.3 GREEN (MISCELLANEOUS) IMPLANT
ELECTRODE REM PT RTRN 9FT ADLT (ELECTROSURGICAL) ×1 IMPLANT
FORCEPS JEWEL BIP 4-3/4 STR (INSTRUMENTS) ×3 IMPLANT
GLOVE BIO SURGEON STRL SZ7.5 (GLOVE) ×3 IMPLANT
GOWN STRL REUS W/ TWL LRG LVL3 (GOWN DISPOSABLE) ×2 IMPLANT
GOWN STRL REUS W/TWL LRG LVL3 (GOWN DISPOSABLE) ×4
HARMONIC SCALPEL FOCUS (MISCELLANEOUS) ×3 IMPLANT
HOOK STAY BLUNT/RETRACTOR 5M (MISCELLANEOUS) ×3 IMPLANT
KIT RM TURNOVER STRD PROC AR (KITS) ×3 IMPLANT
NS IRRIG 500ML POUR BTL (IV SOLUTION) ×3 IMPLANT
PACK HEAD/NECK (MISCELLANEOUS) ×3 IMPLANT
PROBE MONO 100X0.75 ELECT 1.9M (MISCELLANEOUS) IMPLANT
SPONGE KITTNER 5P (MISCELLANEOUS) ×3 IMPLANT
SPONGE XRAY 4X4 16PLY STRL (MISCELLANEOUS) ×3 IMPLANT
SUT ETHILON 5-0 FS-2 18 BLK (SUTURE) IMPLANT
SUT SILK 2 0 (SUTURE) ×2
SUT SILK 2-0 18XBRD TIE 12 (SUTURE) ×1 IMPLANT
SUT VIC AB 4-0 RB1 18 (SUTURE) ×3 IMPLANT
SYSTEM CHEST DRAIN TLS 7FR (DRAIN) IMPLANT

## 2015-06-22 NOTE — Transfer of Care (Signed)
Immediate Anesthesia Transfer of Care Note  Patient: Dana Burke  Procedure(s) Performed: Procedure(s): THYROGLOSSAL DUCT CYST (N/A)  Patient Location: PACU  Anesthesia Type:General  Level of Consciousness: awake, alert  and oriented  Airway & Oxygen Therapy: Patient Spontanous Breathing and Patient connected to face mask oxygen  Post-op Assessment: Report given to RN and Post -op Vital signs reviewed and stable  Post vital signs: Reviewed and stable  Last Vitals:  Filed Vitals:   06/22/15 0611  BP: 118/60  Pulse: 81  Temp: 36.7 C  Resp: 18    Last Pain: There were no vitals filed for this visit.       Complications: No apparent anesthesia complications

## 2015-06-22 NOTE — Anesthesia Postprocedure Evaluation (Signed)
Anesthesia Post Note  Patient: Dana Burke  Procedure(s) Performed: Procedure(s) (LRB): THYROGLOSSAL DUCT CYST (N/A)  Patient location during evaluation: PACU Anesthesia Type: General Level of consciousness: awake Pain management: pain level controlled Vital Signs Assessment: post-procedure vital signs reviewed and stable Respiratory status: spontaneous breathing Cardiovascular status: stable Anesthetic complications: no    Last Vitals:  Filed Vitals:   06/22/15 1009 06/22/15 1024  BP: 132/69 126/73  Pulse: 85 89  Temp:  36.3 C  Resp: 11 17    Last Pain:  Filed Vitals:   06/22/15 1031  PainSc: 5                  VAN STAVEREN,Pegge Cumberledge

## 2015-06-22 NOTE — H&P (Signed)
History and physical reviewed and will be scanned in later. No change in medical status reported by the patient or family, appears stable for surgery. All questions regarding the procedure answered, and patient (or family if a child) expressed understanding of the procedure. CV: RRR without murmur Lungs: CTA bilaterally  [name] @TODAY @

## 2015-06-22 NOTE — Telephone Encounter (Signed)
Letter and documentation faxed as requested.

## 2015-06-22 NOTE — Progress Notes (Signed)
Patient ID: Dana Burke, female   DOB: 08-10-1945, 70 y.o.   MRN: [id number] Dana Burke, Dana Burke [id number] September 29, 1945 Dana Nearing, MD   SUBJECTIVE: This 70 y.o. year old female is status post THYROGLOSSAL DUCT CYST excision today. Mild sore throat, but otherwise doing weel. No nausea. Feels she can drink liquids, but not ready for regular diet.   Medications:  Current Facility-Administered Medications  Medication Dose Route Frequency Provider Last Rate Last Dose  . bacitracin ointment 1 application  1 application Topical [id number] Clyde Canterbury, MD      . buPROPion (WELLBUTRIN XL) 24 hr tablet 300 mg  300 mg Oral Daily Clyde Canterbury, MD   300 mg at 06/22/15 1545  . cholecalciferol (VITAMIN D) tablet 1,000 Units  1,000 Units Oral BID Clyde Canterbury, MD      . dextrose 5 % and 0.45 % NaCl with KCl 20 mEq/L infusion   Intravenous Continuous Clyde Canterbury, MD      . docusate sodium (COLACE) capsule 100 mg  100 mg Oral BID Clyde Canterbury, MD      . gabapentin (NEURONTIN) capsule 300 mg  300 mg Oral QID Clyde Canterbury, MD   300 mg at 06/22/15 1545  . HYDROcodone-acetaminophen (NORCO/VICODIN) 5-325 MG per tablet 1-2 tablet  1-2 tablet Oral Q4H PRN Clyde Canterbury, MD      . morphine 2 MG/ML injection 2-4 mg  2-4 mg Intravenous Q2H PRN Clyde Canterbury, MD      . ondansetron [facility]) tablet 4 mg  4 mg Oral Q4H PRN Clyde Canterbury, MD       Or  . ondansetron [facility]) injection 4 mg  4 mg Intravenous Q4H PRN Clyde Canterbury, MD      . pantoprazole (PROTONIX) EC tablet 40 mg  40 mg Oral Daily Clyde Canterbury, MD      .  Medications Prior to Admission  Medication Sig Dispense Refill  . buPROPion (WELLBUTRIN XL) 300 MG 24 hr tablet TAKE 1 TABLET BY MOUTH ONCE DAILY. 30 tablet 3  . cetirizine (ZYRTEC) 10 MG tablet Take 10 mg by mouth at bedtime.     . gabapentin (NEURONTIN) 300 MG capsule TAKE 1 CAPSULE BY MOUTH 4 TIMES DAILY. 120 capsule 1  . omeprazole (PRILOSEC) 20 MG capsule Take 20 mg by mouth daily.    [name] aspirin 81 MG tablet  Take 81 mg by mouth daily.    . calcium carbonate (OS-CAL) 600 MG TABS Take 600 mg by mouth 2 (two) times daily with a meal.    . Cholecalciferol (VITAMIN D-3) 1000 UNITS CAPS Take 1 capsule by mouth daily.    . Ibuprofen-Diphenhydramine HCl (ADVIL PM) 200-25 MG CAPS Take 2 capsules by mouth at bedtime as needed.     . Multiple Vitamins-Minerals (MULTIVITAMIN WITH MINERALS) tablet Take 1 tablet by mouth daily.      OBJECTIVE:  PHYSICAL EXAM  Vitals: Blood pressure 125/64, pulse 90, temperature 97.8 F (36.6 C), temperature source Oral, resp. rate 20, height 6' (1.829 m), weight 104.327 kg (230 lb), SpO2 92 %.. General: Well-developed, Well-nourished in no acute distress Mood: Mood and affect well adjusted, pleasant and cooperative. Orientation: Grossly alert and oriented. Vocal Quality: No hoarseness. Communicates verbally. head and Face: NCAT. No facial asymmetry. No visible skin lesions. No significant facial scars. No tenderness with sinus percussion. Facial strength normal and symmetric. Neck: Supple and symmetric with drain in place. Sutures intact. Wound looks good with mild ecchymosis but no hematoma.  Respiratory: Normal respiratory effort  without labored breathing.  MEDICAL DECISION MAKING: Data Review: No results found for this or any previous visit (from the past 48 hour(s)).[name] No results found..   ASSESSMENT: s/p excision thyroglossal duct cyst, doing well.  PLAN: Observe overnight. Try full liquid diet. Plan for d/c tomorrow. Will likely remove drain in AM.   Dana Nearing, MD 06/22/2015 4:43 PM

## 2015-06-22 NOTE — Op Note (Signed)
06/22/2015  9:14 AM    Dana Burke  [id number]   Pre-Op Diagnosis:  THYROGLOSSAL DUCT CYST  Post-op Diagnosis: THYROGLOSSAL DUCT CYST  Procedure:   Excision of thyroglossal duct cyst ([place] procedure)  Surgeon:  [name]   Assisting Surgeon: Margaretha Sheffield  Anesthesia:  General endotracheal  EBL:  [id number]  Complications:  None  Findings: 3 cm cystic midline lesion closely associated with the hyoid bone  Procedure: After the patient was identified in holding and the procedure was reviewed.  The patient was taken to the operating room and with the patient in a comfortable supine position,  general endotracheal anesthesia was induced without difficulty.  A proper time-out was performed, confirming the operative site and procedure.  1% lidocaine with epinephrine 1 100,000 was injected into the skin over the region of the cyst. The patient was placed on a shoulder roll with the head extended slightly. She was then prepped and draped in the usual sterile fashion.  A 15 blade was used to incise the skin. The incision was carried down through the platysma with the Bovie. The skin edges were then retracted with surgical stays. Dissection proceeded down to the cyst itself which was separated from the strap muscles laterally on either side. The Harmonic scalpel was used to divide soft tissue attachments. The cyst was carefully dissected out using the Harmonic scalpel, dissecting the cyst away from the underlying thyroid cartilage, and more superiorly dissecting out the hyoid bone, using the Bovie to remove muscle attachments to the midportion of the hyoid bone in the region of the cyst. Small blood vessels were either divided with the Harmonic scalpel were clamped and suture ligated depending on their size. With the cyst freed up and entered remaining attachment to the hyoid bone, the midportion of the hyoid bone was excised using a bone cutter. Dissection proceeded deep to the hyoid bone  taking a portion of muscle leading into the tongue base, but taking care not to enter the vallecula. A 2-0 silk was used to tie off this tissue deeply, dividing it and removing it along with the associated hyoid bone and cyst. The wound was irrigated with saline. Surgicel was placed adjacent to the cut edge of the hyoid bone to provide additional hemostasis. A #10 TLS drain was placed. The strap muscles were reapproximated with 4-0 Vicryl suture. The platysma was closed with 4-0 Vicryl followed by the subcutaneous tissues. The skin was closed with 5-0 Ethilon in a running locked stitch. The drain was secured with a 4-0 Vicryl suture and connected to a Hemovac tube.  The patient was then returned to the anesthesiologist in good condition for awakening. The patient was awakened and taken to the recovery room in good condition.   Disposition:   PACU then to the floor for observation  Plan: Overnight observation with potential discharge tomorrow  [name] 06/22/2015 9:14 AM

## 2015-06-22 NOTE — Anesthesia Preprocedure Evaluation (Signed)
Anesthesia Evaluation  Patient identified by MRN, date of birth, ID band Patient awake    Reviewed: Allergy & Precautions, NPO status , Patient's Chart, lab work & pertinent test results  Airway Mallampati: II       Dental  (+) Teeth Intact   Pulmonary former smoker,    breath sounds clear to auscultation       Cardiovascular Exercise Tolerance: Good  Rhythm:Regular Rate:Normal     Neuro/Psych Depression    GI/Hepatic Neg liver ROS, GERD  Medicated,  Endo/Other  negative endocrine ROS  Renal/GU negative Renal ROS     Musculoskeletal   Abdominal (+) + obese,   Peds negative pediatric ROS (+)  Hematology negative hematology ROS (+)   Anesthesia Other Findings   Reproductive/Obstetrics                             Anesthesia Physical Anesthesia Plan  ASA: II  Anesthesia Plan: General   Post-op Pain Management:    Induction: Intravenous  Airway Management Planned: Oral ETT  Additional Equipment:   Intra-op Plan:   Post-operative Plan: Extubation in OR  Informed Consent: I have reviewed the patients History and Physical, chart, labs and discussed the procedure including the risks, benefits and alternatives for the proposed anesthesia with the patient or authorized representative who has indicated his/her understanding and acceptance.     Plan Discussed with: CRNA  Anesthesia Plan Comments:         Anesthesia Quick Evaluation

## 2015-06-22 NOTE — Anesthesia Procedure Notes (Signed)
Procedure Name: Intubation Date/Time: 06/22/2015 7:45 AM Performed by: Marsh Dolly Pre-anesthesia Checklist: Patient identified, Patient being monitored, Timeout performed, Emergency Drugs available and Suction available Patient Re-evaluated:Patient Re-evaluated prior to inductionOxygen Delivery Method: Circle system utilized Preoxygenation: Pre-oxygenation with 100% oxygen Intubation Type: IV induction Ventilation: Mask ventilation without difficulty Laryngoscope Size: Mac and 4 Grade View: Grade I Tube type: Oral Tube size: 7.0 mm Number of attempts: 1 Placement Confirmation: ETT inserted through vocal cords under direct vision,  positive ETCO2 and breath sounds checked- equal and bilateral Secured at: 21 cm Tube secured with: Tape Dental Injury: Teeth and Oropharynx as per pre-operative assessment

## 2015-06-23 ENCOUNTER — Encounter: Payer: Self-pay | Admitting: Otolaryngology

## 2015-06-23 LAB — SURGICAL PATHOLOGY

## 2015-06-23 MED ORDER — HYDROCODONE-ACETAMINOPHEN 5-325 MG PO TABS: 1.0000 | ORAL_TABLET | ORAL | Status: DC | PRN

## 2015-06-23 NOTE — Care Management Obs Status (Signed)
[place] NOTIFICATION   Patient Details  Name: Dana Burke MRN: [id number] Date of Birth: [birth year]   Medicare Observation Status Notification Given:  Yes    Katrina Stack, RN 06/23/2015, 8:58 AM

## 2015-06-23 NOTE — Progress Notes (Signed)
Patient ID: Dana Burke, female   DOB: Jul 04, 1945, 70 y.o.   MRN: [id number] Dana Burke, Dana Burke [id number] 1945-03-03 Dana Nearing, MD   SUBJECTIVE: This 70 y.o. year old female is status post THYROGLOSSAL DUCT CYST excision. C/o sore throat but otherwise doing well. Wants to go home.  Medications:  Current Facility-Administered Medications  Medication Dose Route Frequency Provider Last Rate Last Dose  . bacitracin ointment 1 application  1 application Topical [id number] Clyde Canterbury, MD   1 application at [id number] [phone]  . buPROPion (WELLBUTRIN XL) 24 hr tablet 300 mg  300 mg Oral Daily Clyde Canterbury, MD   300 mg at 06/22/15 1545  . cholecalciferol (VITAMIN D) tablet 1,000 Units  1,000 Units Oral BID Clyde Canterbury, MD   1,000 Units at 06/22/15 1545  . dextrose 5 % and 0.45 % NaCl with KCl 20 mEq/L infusion   Intravenous Continuous Clyde Canterbury, MD 100 mL/hr at 06/22/15 2206    . docusate sodium (COLACE) capsule 100 mg  100 mg Oral BID Clyde Canterbury, MD   100 mg at 06/22/15 1545  . gabapentin (NEURONTIN) capsule 300 mg  300 mg Oral QID Clyde Canterbury, MD   300 mg at 06/22/15 2147  . HYDROcodone-acetaminophen (NORCO/VICODIN) 5-325 MG per tablet 1-2 tablet  1-2 tablet Oral Q4H PRN Clyde Canterbury, MD   1 tablet at 06/22/15 2147  . morphine 2 MG/ML injection 2-4 mg  2-4 mg Intravenous Q2H PRN Clyde Canterbury, MD      . ondansetron [facility]) tablet 4 mg  4 mg Oral Q4H PRN Clyde Canterbury, MD       Or  . ondansetron [facility]) injection 4 mg  4 mg Intravenous Q4H PRN Clyde Canterbury, MD      . pantoprazole (PROTONIX) EC tablet 40 mg  40 mg Oral Daily Clyde Canterbury, MD   40 mg at 06/22/15 1545  .  Medications Prior to Admission  Medication Sig Dispense Refill  . buPROPion (WELLBUTRIN XL) 300 MG 24 hr tablet TAKE 1 TABLET BY MOUTH ONCE DAILY. 30 tablet 3  . cetirizine (ZYRTEC) 10 MG tablet Take 10 mg by mouth at bedtime.     . gabapentin (NEURONTIN) 300 MG capsule TAKE 1 CAPSULE BY MOUTH 4 TIMES DAILY. 120 capsule 1  .  omeprazole (PRILOSEC) 20 MG capsule Take 20 mg by mouth daily.    [name] aspirin 81 MG tablet Take 81 mg by mouth daily.    . calcium carbonate (OS-CAL) 600 MG TABS Take 600 mg by mouth 2 (two) times daily with a meal.    . Cholecalciferol (VITAMIN D-3) 1000 UNITS CAPS Take 1 capsule by mouth daily.    . Ibuprofen-Diphenhydramine HCl (ADVIL PM) 200-25 MG CAPS Take 2 capsules by mouth at bedtime as needed.     . Multiple Vitamins-Minerals (MULTIVITAMIN WITH MINERALS) tablet Take 1 tablet by mouth daily.      OBJECTIVE:  PHYSICAL EXAM  Vitals: Blood pressure 140/75, pulse 78, temperature 97.7 F (36.5 C), temperature source Oral, resp. rate 16, height 6' (1.829 m), weight 104.327 kg (230 lb), SpO2 100 %.. General: Well-developed, Well-nourished in no acute distress Mood: Mood and affect well adjusted, pleasant and cooperative. Orientation: Grossly alert and oriented. Vocal Quality: No hoarseness. Communicates verbally. Neck: Supple and symmetric with no palpable masses. Wound intact with minimal swelling. No infection. Drain removed.  Respiratory: Normal respiratory effort without labored breathing.  MEDICAL DECISION MAKING: Data Review: No results found for this or any previous visit (from the  past 48 hour(s)).[name] No results found..   ASSESSMENT: s/p excision thyroglossal duct cyst, doing well. Drain removed.  PLAN: d/c home on Lortab for pain. Advance diet as tolerated. F/u 1 week.   Dana Nearing, MD 06/23/2015 8:17 AM

## 2015-06-24 ENCOUNTER — Ambulatory Visit: Payer: Medicare Other | Admitting: Internal Medicine

## 2015-06-29 DIAGNOSIS — R1310 Dysphagia, unspecified: Secondary | ICD-10-CM | POA: Diagnosis not present

## 2015-07-04 ENCOUNTER — Encounter: Payer: Medicare Other | Admitting: Internal Medicine

## 2015-07-25 ENCOUNTER — Other Ambulatory Visit: Payer: Self-pay | Admitting: Internal Medicine

## 2015-08-08 DIAGNOSIS — D229 Melanocytic nevi, unspecified: Secondary | ICD-10-CM | POA: Diagnosis not present

## 2015-08-08 DIAGNOSIS — L578 Other skin changes due to chronic exposure to nonionizing radiation: Secondary | ICD-10-CM | POA: Diagnosis not present

## 2015-08-08 DIAGNOSIS — Z1283 Encounter for screening for malignant neoplasm of skin: Secondary | ICD-10-CM | POA: Diagnosis not present

## 2015-08-08 DIAGNOSIS — L814 Other melanin hyperpigmentation: Secondary | ICD-10-CM | POA: Diagnosis not present

## 2015-08-08 DIAGNOSIS — L821 Other seborrheic keratosis: Secondary | ICD-10-CM | POA: Diagnosis not present

## 2015-08-08 DIAGNOSIS — L859 Epidermal thickening, unspecified: Secondary | ICD-10-CM | POA: Diagnosis not present

## 2015-08-08 DIAGNOSIS — D1800 Hemangioma unspecified site: Secondary | ICD-10-CM | POA: Diagnosis not present

## 2015-08-08 DIAGNOSIS — L820 Inflamed seborrheic keratosis: Secondary | ICD-10-CM | POA: Diagnosis not present

## 2015-08-08 DIAGNOSIS — Z808 Family history of malignant neoplasm of other organs or systems: Secondary | ICD-10-CM | POA: Diagnosis not present

## 2015-08-16 ENCOUNTER — Other Ambulatory Visit: Payer: Self-pay | Admitting: Internal Medicine

## 2015-08-16 DIAGNOSIS — Z1231 Encounter for screening mammogram for malignant neoplasm of breast: Secondary | ICD-10-CM

## 2015-08-17 ENCOUNTER — Encounter: Payer: Medicare Other | Admitting: Internal Medicine

## 2015-09-26 ENCOUNTER — Ambulatory Visit: Payer: Medicare Other

## 2015-09-30 ENCOUNTER — Ambulatory Visit: Payer: Medicare Other

## 2015-10-06 ENCOUNTER — Other Ambulatory Visit: Payer: Self-pay | Admitting: Internal Medicine

## 2015-10-07 ENCOUNTER — Encounter: Payer: Medicare Other | Admitting: Internal Medicine

## 2015-10-11 DIAGNOSIS — H43393 Other vitreous opacities, bilateral: Secondary | ICD-10-CM | POA: Diagnosis not present

## 2015-10-11 DIAGNOSIS — Z961 Presence of intraocular lens: Secondary | ICD-10-CM | POA: Diagnosis not present

## 2015-10-24 ENCOUNTER — Other Ambulatory Visit: Payer: Self-pay | Admitting: Internal Medicine

## 2015-10-25 ENCOUNTER — Telehealth: Payer: Self-pay | Admitting: Internal Medicine

## 2015-10-25 MED ORDER — GABAPENTIN 300 MG PO CAPS: 300.0000 mg | ORAL_CAPSULE | Freq: Four times a day (QID) | ORAL | 1 refills | Status: DC

## 2015-10-25 NOTE — Telephone Encounter (Signed)
Refill sent,  6 month follow up needed

## 2015-10-25 NOTE — Telephone Encounter (Signed)
Okay to refill? Last seen in February but has cancelled CPE appt four times since that visit.Dana Burke

## 2015-10-25 NOTE — Telephone Encounter (Signed)
Last OV 03/05/15 ok to fill?

## 2015-10-25 NOTE — Telephone Encounter (Signed)
Pt called and stated that she is completely out of her gabapentin (NEURONTIN) 300 MG capsule. Can we refill this for her. Please, advise, thank you!  Call pt @ [place] [place] [address].

## 2015-10-25 NOTE — Telephone Encounter (Signed)
Please schedule patient 6 month follow up.

## 2015-10-25 NOTE — Telephone Encounter (Signed)
Pt has an appt for 11/17 with Dr. Derrel Nip already scheduled.

## 2015-10-28 ENCOUNTER — Ambulatory Visit
Admission: RE | Admit: 2015-10-28 | Discharge: 2015-10-28 | Disposition: A | Discharge status: Home / Self Care | Payer: Medicare Other | Source: Ambulatory Visit | Attending: Internal Medicine | Admitting: Internal Medicine

## 2015-10-28 DIAGNOSIS — Z1231 Encounter for screening mammogram for malignant neoplasm of breast: Secondary | ICD-10-CM | POA: Diagnosis not present

## 2015-11-02 ENCOUNTER — Other Ambulatory Visit: Payer: Self-pay | Admitting: Internal Medicine

## 2015-11-02 DIAGNOSIS — R928 Other abnormal and inconclusive findings on diagnostic imaging of breast: Secondary | ICD-10-CM

## 2015-11-03 ENCOUNTER — Other Ambulatory Visit: Payer: Self-pay | Admitting: Internal Medicine

## 2015-11-03 DIAGNOSIS — R928 Other abnormal and inconclusive findings on diagnostic imaging of breast: Secondary | ICD-10-CM

## 2015-11-07 DIAGNOSIS — Q892 Congenital malformations of other endocrine glands: Secondary | ICD-10-CM | POA: Diagnosis not present

## 2015-11-10 ENCOUNTER — Ambulatory Visit
Admission: RE | Admit: 2015-11-10 | Discharge: 2015-11-10 | Disposition: A | Discharge status: Home / Self Care | Payer: Medicare Other | Source: Ambulatory Visit | Attending: Internal Medicine | Admitting: Internal Medicine

## 2015-11-10 ENCOUNTER — Other Ambulatory Visit: Payer: Self-pay | Admitting: Internal Medicine

## 2015-11-10 DIAGNOSIS — N6320 Unspecified lump in the left breast, unspecified quadrant: Secondary | ICD-10-CM

## 2015-11-10 DIAGNOSIS — N6012 Diffuse cystic mastopathy of left breast: Secondary | ICD-10-CM | POA: Diagnosis not present

## 2015-11-10 DIAGNOSIS — N6321 Unspecified lump in the left breast, upper outer quadrant: Secondary | ICD-10-CM | POA: Diagnosis not present

## 2015-11-10 DIAGNOSIS — N6322 Unspecified lump in the left breast, upper inner quadrant: Secondary | ICD-10-CM | POA: Diagnosis not present

## 2015-11-10 DIAGNOSIS — R928 Other abnormal and inconclusive findings on diagnostic imaging of breast: Secondary | ICD-10-CM

## 2015-11-10 HISTORY — PX: BREAST BIOPSY: SHX20

## 2015-11-23 ENCOUNTER — Other Ambulatory Visit: Payer: Self-pay | Admitting: Internal Medicine

## 2015-12-02 ENCOUNTER — Encounter: Payer: Self-pay | Admitting: Internal Medicine

## 2015-12-02 ENCOUNTER — Ambulatory Visit (INDEPENDENT_AMBULATORY_CARE_PROVIDER_SITE_OTHER): Payer: Medicare Other | Admitting: Internal Medicine

## 2015-12-02 VITALS — BP 110/60 | HR 100 | Temp 98.4°F | Resp 12 | Ht 70.0 in | Wt 237.2 lb

## 2015-12-02 DIAGNOSIS — R7301 Impaired fasting glucose: Secondary | ICD-10-CM

## 2015-12-02 DIAGNOSIS — Z23 Encounter for immunization: Secondary | ICD-10-CM | POA: Diagnosis not present

## 2015-12-02 DIAGNOSIS — R5383 Other fatigue: Secondary | ICD-10-CM | POA: Diagnosis not present

## 2015-12-02 DIAGNOSIS — E559 Vitamin D deficiency, unspecified: Secondary | ICD-10-CM | POA: Diagnosis not present

## 2015-12-02 DIAGNOSIS — Z87898 Personal history of other specified conditions: Secondary | ICD-10-CM

## 2015-12-02 DIAGNOSIS — E785 Hyperlipidemia, unspecified: Secondary | ICD-10-CM

## 2015-12-02 DIAGNOSIS — Q892 Congenital malformations of other endocrine glands: Secondary | ICD-10-CM

## 2015-12-02 DIAGNOSIS — Z0000 Encounter for general adult medical examination without abnormal findings: Secondary | ICD-10-CM

## 2015-12-02 DIAGNOSIS — G4700 Insomnia, unspecified: Secondary | ICD-10-CM

## 2015-12-02 LAB — COMPREHENSIVE METABOLIC PANEL
ALBUMIN: 4.3 g/dL (ref 3.5–5.2)
ALK PHOS: 80 U/L (ref 39–117)
ALT: 19 U/L (ref 0–35)
AST: 16 U/L (ref 0–37)
BILIRUBIN TOTAL: 0.6 mg/dL (ref 0.2–1.2)
BUN: 14 mg/dL (ref 6–23)
CALCIUM: 10.4 mg/dL (ref 8.4–10.5)
CO2: 28 mEq/L (ref 19–32)
Chloride: 105 mEq/L (ref 96–112)
Creatinine, Ser: 0.88 mg/dL (ref 0.40–1.20)
GFR: 67.01 mL/min (ref >60.00)
GLUCOSE: 90 mg/dL (ref 70–99)
POTASSIUM: 4.1 meq/L (ref 3.5–5.1)
Sodium: 140 mEq/L (ref 135–145)
TOTAL PROTEIN: 7.1 g/dL (ref 6.0–8.3)

## 2015-12-02 LAB — CBC WITH DIFFERENTIAL/PLATELET
BASOS ABS: 0 10*3/uL (ref 0.0–0.1)
Basophils Relative: 0.5 % (ref 0.0–3.0)
EOS ABS: 0.2 10*3/uL (ref 0.0–0.7)
Eosinophils Relative: 1.7 % (ref 0.0–5.0)
HEMATOCRIT: 42.1 % (ref 36.0–46.0)
Hemoglobin: 14.1 g/dL (ref 12.0–15.0)
LYMPHS PCT: 24.9 % (ref 12.0–46.0)
Lymphs Abs: 2.4 10*3/uL (ref 0.7–4.0)
MCHC: 33.6 g/dL (ref 30.0–36.0)
MCV: 89.2 fl (ref 78.0–100.0)
MONOS PCT: 6.3 % (ref 3.0–12.0)
Monocytes Absolute: 0.6 10*3/uL (ref 0.1–1.0)
NEUTROS ABS: 6.4 10*3/uL (ref 1.4–7.7)
NEUTROS PCT: 66.6 % (ref 43.0–77.0)
PLATELETS: 222 10*3/uL (ref 150.0–400.0)
RBC: 4.72 Mil/uL (ref 3.87–5.11)
RDW: 13.6 % (ref 11.5–15.5)
WBC: 9.6 10*3/uL (ref 4.0–10.5)

## 2015-12-02 LAB — LIPID PANEL
Cholesterol: 204 mg/dL — ABNORMAL HIGH (ref 0–200)
HDL: 40.3 mg/dL (ref >39.00)
LDL Cholesterol: 142 mg/dL — ABNORMAL HIGH (ref 0–99)
NonHDL: 164.18
TRIGLYCERIDES: 110 mg/dL (ref 0.0–149.0)
Total CHOL/HDL Ratio: 5
VLDL: 22 mg/dL (ref 0.0–40.0)

## 2015-12-02 LAB — LDL CHOLESTEROL, DIRECT: Direct LDL: 160 mg/dL

## 2015-12-02 LAB — HEMOGLOBIN A1C: Hgb A1c MFr Bld: 5.8 % (ref 4.6–6.5)

## 2015-12-02 LAB — VITAMIN D 25 HYDROXY (VIT D DEFICIENCY, FRACTURES): VITD: 30.4 ng/mL (ref 30.00–100.00)

## 2015-12-02 LAB — TSH: TSH: 0.81 u[IU]/mL (ref 0.35–4.50)

## 2015-12-02 MED ORDER — GABAPENTIN 300 MG PO CAPS: ORAL_CAPSULE | ORAL | 5 refills | Status: DC

## 2015-12-02 NOTE — Progress Notes (Signed)
Pre-visit discussion using our clinic review tool. No additional management support is needed unless otherwise documented below in the visit note.  

## 2015-12-02 NOTE — Progress Notes (Signed)
Patient ID: Dana Burke, female    DOB: 09/11/1945  Age: 70 y.o. MRN: [id number]  The patient is here for annual physical examination and management of other chronic and acute problems. She has had a difficult year.    The risk factors are reflected in the social history.  The roster of all physicians providing medical care to patient - is listed in the Snapshot section of the chart.  Activities of daily living:  The patient is 100% independent in all ADLs: dressing, toileting, feeding as well as independent mobility  Home safety : The patient has smoke detectors in the home. They wear seatbelts.  There are no firearms at home. There is no violence in the home.   There is no risks for hepatitis, STDs or HIV. There is no   history of blood transfusion. They have no travel history to infectious disease endemic areas of the world.  The patient has seen their dentist in the last six month. They have seen their eye doctor in the last year. They admit to slight hearing difficulty with regard to whispered voices and some television programs.  They have deferred audiologic testing in the last year.  They do not  have excessive sun exposure. Discussed the need for sun protection: hats, long sleeves and use of sunscreen if there is significant sun exposure.   Diet: the importance of a healthy diet is discussed. They do have a healthy diet.  The benefits of regular aerobic exercise were discussed. She walks 4 times per week ,  20 minutes.   Depression screen: there are no signs or vegative symptoms of depression- irritability, change in appetite, anhedonia, sadness/tearfullness.  Cognitive assessment: the patient manages all their financial and personal affairs and is actively engaged. They could relate day,date,year and events; recalled 2/3 objects at 3 minutes; performed clock-face test normally.  The following portions of the patient's history were reviewed and updated as appropriate: allergies,  current medications, past family history, past medical history,  past surgical history, past social history  and problem list.  Visual acuity was not assessed per patient preference since she has regular follow up with her ophthalmologist. Hearing and body mass index were assessed and reviewed.   During the course of the visit the patient was educated and counseled about appropriate screening and preventive services including : fall prevention , diabetes screening, nutrition counseling, colorectal cancer screening, and recommended immunizations.    CC: The primary encounter diagnosis was Hyperlipidemia LDL goal <130. Diagnoses of Fatigue, unspecified type, Vitamin D deficiency, Impaired fasting glucose, Encounter for immunization, Encounter for preventive health examination, History of abnormal mammogram, Thyroglossal cyst, and Insomnia, unspecified type were also pertinent to this visit.  "all of sudden I'm old"     1) Had left breast biopsy Oct 26th,  At [facility]  last month,  fibrocystic changes . No malignancy   2) Had Thyroid surgery  in June for removal of a thyroglossal duct cyst,  Had a lot postoperative swelling  managed with prednisone tapers before and after the surgery.  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  The tooth had to be extracted,  The prolonged pain was due to a undetected splintering of the jaw bone that was found on repeat evaluation when a splinter was detected protruding from her gumline.  She had  2 weeks of misery  4) Constipation: using a stool softener  Every 2-3 days   And a probiotic   5) Insomnia. tosses and turns a lot,  Gets at least 8 hours. In the bed.  Wakes up a little tired, but denies daytime somnolence  Told she snores,  Does not want a sleep study    History Dana Burke has a past medical history of Depression; GERD (gastroesophageal reflux disease); Phlebitis and thrombophlebitis of the leg  ([place]); and Pulmonary embolism ([place]).   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   Her family history includes Cancer in her brother, paternal uncle, sister, and sister; Diabetes in her father; Heart disease in her father and mother; Hyperlipidemia in her father and mother.She reports that she quit smoking about 28 years ago. Her smoking use included Cigarettes. She smoked 0.40 packs per day. She has never used smokeless tobacco. She reports that she does not drink alcohol or use drugs.  Outpatient Medications Prior to Visit  Medication Sig Dispense Refill  . buPROPion (WELLBUTRIN XL) 300 MG 24 hr tablet TAKE 1 TABLET BY MOUTH ONCE DAILY 30 tablet 3  . calcium carbonate (OS-CAL) 600 MG TABS Take 600 mg by mouth 2 (two) times daily with a meal.    . cetirizine (ZYRTEC) 10 MG tablet Take 10 mg by mouth at bedtime.     . Cholecalciferol (VITAMIN D-3) 1000 UNITS CAPS Take 1 capsule by mouth daily.    . Multiple Vitamins-Minerals (MULTIVITAMIN WITH MINERALS) tablet Take 1 tablet by mouth daily.    [name] omeprazole (PRILOSEC) 20 MG capsule Take 20 mg by mouth daily.    [name] gabapentin (NEURONTIN) 300 MG capsule TAKE 1 CAPSULE BY MOUTH 4 TIMES DAILY 120 capsule 0  . HYDROcodone-acetaminophen (NORCO/VICODIN) 5-325 MG tablet Take 1-2 tablets by mouth every 4 (four) hours as needed for moderate pain. 30 tablet 0   No facility-administered medications prior to visit.     Review of Systems   Patient denies headache, fevers, malaise, unintentional weight loss, skin rash, eye pain, sinus congestion and sinus pain, sore throat, dysphagia,  hemoptysis , cough, dyspnea, wheezing, chest pain, palpitations, orthopnea, edema, abdominal pain, nausea, melena, diarrhea, flank pain, dysuria, hematuria, urinary  Frequency, nocturia, numbness, tingling, seizures,  Focal weakness, Loss of consciousness,  Tremor,  , depression, anxiety, and suicidal ideation.      Objective:  BP 110/60   Pulse 100   Temp 98.4 F (36.9 C) (Oral)   Resp 12   Ht 5\' 10"  (1.778 m)   Wt 237 lb 4 oz (107.6 kg)   SpO2 93%   BMI 34.04 kg/m   Physical Exam   General appearance: alert, cooperative and appears stated age Head: Normocephalic, without obvious abnormality, atraumatic Eyes: conjunctivae/corneas clear. PERRL, EOM's intact. Fundi benign. Ears: normal TM's and external ear canals both ears Nose: Nares normal. Septum midline. Mucosa normal. No drainage or sinus tenderness. Throat: lips, mucosa, and tongue normal; teeth and gums normal Neck: no adenopathy, no carotid bruit, no JVD, supple, symmetrical, trachea midline and thyroid not enlarged, symmetric, no tenderness/mass/nodules Lungs: clear to auscultation bilaterally Breasts: normal appearance, no masses or tenderness Heart: regular rate and rhythm, S1, S2 normal, no murmur, click, rub or gallop Abdomen: soft, non-tender; bowel sounds normal; no masses,  no organomegaly Extremities: extremities normal, atraumatic, no cyanosis or edema Pulses: 2+ and symmetric Skin: Skin color, texture, turgor normal. No rashes or lesions Neurologic: Alert and oriented X 3, normal strength  and tone. Normal symmetric reflexes. Normal coordination and gait.     Assessment & Plan:   Problem List Items Addressed This Visit    History of abnormal mammogram    Left Biopsy was benign.  She now has markers placed for future mammography.       Thyroglossal cyst    S/p excisional biopsy in June, complicated by postoperative swelling treated with steroids. Path report was benign.        Encounter for preventive health examination    Annual comprehensive preventive exam was done as well as an evaluation and management of chronic conditions .  During the course of the visit the patient was educated and counseled about appropriate screening and preventive services including :   diabetes screening, lipid analysis with projected  10 year  risk for CAD , nutrition counseling, breast, cervical and colorectal cancer screening, and recommended immunizations.  Printed recommendations for health maintenance screenings was given.      Insomnia    Aggravated by snoring,  May have OSA but has deferred sleep study and sedating meds. Discussed natural remedies for insomnia including herbal tea and melatonin.  Reviewed principles of good sleep hygiene      Hyperlipidemia LDL goal <130 - Primary   Relevant Orders   Lipid panel (Completed)   LDL cholesterol, direct (Completed)    Other Visit Diagnoses    Fatigue, unspecified type       Relevant Orders   Comprehensive metabolic panel (Completed)   CBC with Differential/Platelet (Completed)   LDL cholesterol, direct (Completed)   TSH (Completed)   Vitamin D deficiency       Relevant Orders   VITAMIN D 25 Hydroxy (Vit-D Deficiency, Fractures) (Completed)   LDL cholesterol, direct (Completed)   Impaired fasting glucose       Relevant Orders   Hemoglobin A1c (Completed)   Encounter for immunization       Relevant Orders   Flu vaccine HIGH DOSE PF (Completed)     A total of 40 minutes was spent with patient more than half of which was spent in counseling patient on the above mentioned issues , reviewing and explaining recent labs and imaging studies done, and coordination of care.  I have discontinued Ms. Mckibben's HYDROcodone-acetaminophen. I am also having her maintain her omeprazole, multivitamin with minerals, calcium carbonate, Vitamin D-3, cetirizine, buPROPion, and gabapentin.  Meds ordered this encounter  Medications  . gabapentin (NEURONTIN) 300 MG capsule    Sig: TAKE 1 CAPSULE BY MOUTH 4 TIMES DAILY    Dispense:  120 capsule    Refill:  5    Medications Discontinued During This Encounter  Medication Reason  . HYDROcodone-acetaminophen (NORCO/VICODIN) 5-325 MG tablet Completed Course  . gabapentin  (NEURONTIN) 300 MG capsule Reorder    Follow-up: Return in about 6 months (around 05/31/2016).   Crecencio Mc, MD

## 2015-12-02 NOTE — Patient Instructions (Addendum)
Try the Digestive Advantage probiotic by [employer],  Chocolate flavored  We will schedule your wellness visit with Richardo Hanks soon   Health Maintenance for Postmenopausal Women Introduction Menopause is a normal process in which your reproductive ability comes to an end. This process happens gradually over a span of months to years, usually between the ages of 44 and 39. Menopause is complete when you have missed 12 consecutive menstrual periods. It is important to talk with your health care provider about some of the most common conditions that affect postmenopausal women, such as heart disease, cancer, and bone loss (osteoporosis). Adopting a healthy lifestyle and getting preventive care can help to promote your health and wellness. Those actions can also lower your chances of developing some of these common conditions. What should I know about menopause? During menopause, you may experience a number of symptoms, such as:  Moderate-to-severe hot flashes.  Night sweats.  Decrease in sex drive.  Mood swings.  Headaches.  Tiredness.  Irritability.  Memory problems.  Insomnia. Choosing to treat or not to treat menopausal changes is an individual decision that you make with your health care provider. What should I know about hormone replacement therapy and supplements? Hormone therapy products are effective for treating symptoms that are associated with menopause, such as hot flashes and night sweats. Hormone replacement carries certain risks, especially as you become older. If you are thinking about using estrogen or estrogen with progestin treatments, discuss the benefits and risks with your health care provider. What should I know about heart disease and stroke? Heart disease, heart attack, and stroke become more likely as you age. This may be due, in part, to the hormonal changes that your body experiences during menopause. These can affect how your body processes dietary fats,  triglycerides, and cholesterol. Heart attack and stroke are both medical emergencies. There are many things that you can do to help prevent heart disease and stroke:  Have your blood pressure checked at least every 1-2 years. High blood pressure causes heart disease and increases the risk of stroke.  If you are 66-65 years old, ask your health care provider if you should take aspirin to prevent a heart attack or a stroke.  Do not use any tobacco products, including cigarettes, chewing tobacco, or electronic cigarettes. If you need help quitting, ask your health care provider.  It is important to eat a healthy diet and maintain a healthy weight.  Be sure to include plenty of vegetables, fruits, low-fat dairy products, and lean protein.  Avoid eating foods that are high in solid fats, added sugars, or salt (sodium).  Get regular exercise. This is one of the most important things that you can do for your health.  Try to exercise for at least 150 minutes each week. The type of exercise that you do should increase your heart rate and make you sweat. This is known as moderate-intensity exercise.  Try to do strengthening exercises at least twice each week. Do these in addition to the moderate-intensity exercise.  Know your numbers.Ask your health care provider to check your cholesterol and your blood glucose. Continue to have your blood tested as directed by your health care provider. What should I know about cancer screening? There are several types of cancer. Take the following steps to reduce your risk and to catch any cancer development as early as possible. Breast Cancer  Practice breast self-awareness.  This means understanding how your breasts normally appear and feel.  It also means  doing regular breast self-exams. Let your health care provider know about any changes, no matter how small.  If you are 66 or older, have a clinician do a breast exam (clinical breast exam or CBE) every  year. Depending on your age, family history, and medical history, it may be recommended that you also have a yearly breast X-ray (mammogram).  If you have a family history of breast cancer, talk with your health care provider about genetic screening.  If you are at high risk for breast cancer, talk with your health care provider about having an MRI and a mammogram every year.  Breast cancer (BRCA) gene test is recommended for women who have family members with BRCA-related cancers. Results of the assessment will determine the need for genetic counseling and BRCA1 and for BRCA2 testing. BRCA-related cancers include these types:  Breast. This occurs in males or females.  Ovarian.  Tubal. This may also be called fallopian tube cancer.  Cancer of the abdominal or pelvic lining (peritoneal cancer).  Prostate.  Pancreatic. Cervical, Uterine, and Ovarian Cancer  Your health care provider may recommend that you be screened regularly for cancer of the pelvic organs. These include your ovaries, uterus, and vagina. This screening involves a pelvic exam, which includes checking for microscopic changes to the surface of your cervix (Pap test).  For women ages 21-65, health care providers may recommend a pelvic exam and a Pap test every three years. For women ages 53-65, they may recommend the Pap test and pelvic exam, combined with testing for human papilloma virus (HPV), every five years. Some types of HPV increase your risk of cervical cancer. Testing for HPV may also be done on women of any age who have unclear Pap test results.  Other health care providers may not recommend any screening for nonpregnant women who are considered low risk for pelvic cancer and have no symptoms. Ask your health care provider if a screening pelvic exam is right for you.  If you have had past treatment for cervical cancer or a condition that could lead to cancer, you need Pap tests and screening for cancer for at least  20 years after your treatment. If Pap tests have been discontinued for you, your risk factors (such as having a new sexual partner) need to be reassessed to determine if you should start having screenings again. Some women have medical problems that increase the chance of getting cervical cancer. In these cases, your health care provider may recommend that you have screening and Pap tests more often.  If you have a family history of uterine cancer or ovarian cancer, talk with your health care provider about genetic screening.  If you have vaginal bleeding after reaching menopause, tell your health care provider.  There are currently no reliable tests available to screen for ovarian cancer. Lung Cancer  Lung cancer screening is recommended for adults 17-34 years old who are at high risk for lung cancer because of a history of smoking. A yearly low-dose CT scan of the lungs is recommended if you:  Currently smoke.  Have a history of at least 30 pack-years of smoking and you currently smoke or have quit within the past 15 years. A pack-year is smoking an average of one pack of cigarettes per day for one year. Yearly screening should:  Continue until it has been 15 years since you quit.  Stop if you develop a health problem that would prevent you from having lung cancer treatment. Colorectal Cancer  Cancer  This type of cancer can be detected and can often be prevented.  Routine colorectal cancer screening usually begins at age 50 and continues through age 75.  If you have risk factors for colon cancer, your health care provider may recommend that you be screened at an earlier age.  If you have a family history of colorectal cancer, talk with your health care provider about genetic screening.  Your health care provider may also recommend using home test kits to check for hidden blood in your stool.  A small camera at the end of a tube can be used to examine your colon directly (sigmoidoscopy or  colonoscopy). This is done to check for the earliest forms of colorectal cancer.  Direct examination of the colon should be repeated every 5-10 years until age 75. However, if early forms of precancerous polyps or small growths are found or if you have a family history or genetic risk for colorectal cancer, you may need to be screened more often. Skin Cancer  Check your skin from head to toe regularly.  Monitor any moles. Be sure to tell your health care provider:  About any new moles or changes in moles, especially if there is a change in a mole's shape or color.  If you have a mole that is larger than the size of a pencil eraser.  If any of your family members has a history of skin cancer, especially at a young age, talk with your health care provider about genetic screening.  Always use sunscreen. Apply sunscreen liberally and repeatedly throughout the day.  Whenever you are outside, protect yourself by wearing long sleeves, pants, a wide-brimmed hat, and sunglasses. What should I know about osteoporosis? Osteoporosis is a condition in which bone destruction happens more quickly than new bone creation. After menopause, you may be at an increased risk for osteoporosis. To help prevent osteoporosis or the bone fractures that can happen because of osteoporosis, the following is recommended:  If you are 19-50 years old, get at least 1,000 mg of calcium and at least 600 mg of vitamin D per day.  If you are older than age 50 but younger than age 70, get at least 1,200 mg of calcium and at least 600 mg of vitamin D per day.  If you are older than age 70, get at least 1,200 mg of calcium and at least 800 mg of vitamin D per day. Smoking and excessive alcohol intake increase the risk of osteoporosis. Eat foods that are rich in calcium and vitamin D, and do weight-bearing exercises several times each week as directed by your health care provider. What should I know about how menopause affects my  mental health? Depression may occur at any age, but it is more common as you become older. Common symptoms of depression include:  Low or sad mood.  Changes in sleep patterns.  Changes in appetite or eating patterns.  Feeling an overall lack of motivation or enjoyment of activities that you previously enjoyed.  Frequent crying spells. Talk with your health care provider if you think that you are experiencing depression. What should I know about immunizations? It is important that you get and maintain your immunizations. These include:  Tetanus, diphtheria, and pertussis (Tdap) booster vaccine.  Influenza every year before the flu season begins.  Pneumonia vaccine.  Shingles vaccine. Your health care provider may also recommend other immunizations. This information is not intended to replace advice given to you by your health care   provider. Make sure you discuss any questions you have with your health care provider. Document Released: 02/23/2005 Document Revised: 07/22/2015 Document Reviewed: 10/05/2014  2017 [employer]  

## 2015-12-04 DIAGNOSIS — G4700 Insomnia, unspecified: Secondary | ICD-10-CM | POA: Insufficient documentation

## 2015-12-04 DIAGNOSIS — Z0000 Encounter for general adult medical examination without abnormal findings: Secondary | ICD-10-CM | POA: Insufficient documentation

## 2015-12-04 NOTE — Assessment & Plan Note (Signed)
Aggravated by snoring,  May have OSA but has deferred sleep study and sedating meds. Discussed natural remedies for insomnia including herbal tea and melatonin.  Reviewed principles of good sleep hygiene 

## 2015-12-04 NOTE — Assessment & Plan Note (Signed)
Annual comprehensive preventive exam was done as well as an evaluation and management of chronic conditions .  During the course of the visit the patient was educated and counseled about appropriate screening and preventive services including :  diabetes screening, lipid analysis with projected  10 year  risk for CAD , nutrition counseling, breast, cervical and colorectal cancer screening, and recommended immunizations.  Printed recommendations for health maintenance screenings was given 

## 2015-12-04 NOTE — Assessment & Plan Note (Addendum)
Left Biopsy was benign.  She now has markers placed for future mammography.

## 2015-12-04 NOTE — Assessment & Plan Note (Signed)
S/p excisional biopsy in June, complicated by postoperative swelling treated with steroids. Path report was benign.

## 2016-02-01 ENCOUNTER — Ambulatory Visit: Payer: Medicare Other

## 2016-02-06 ENCOUNTER — Other Ambulatory Visit: Payer: Self-pay | Admitting: Internal Medicine

## 2016-02-17 ENCOUNTER — Ambulatory Visit: Payer: Medicare Other

## 2016-03-19 ENCOUNTER — Ambulatory Visit (INDEPENDENT_AMBULATORY_CARE_PROVIDER_SITE_OTHER): Payer: Medicare Other

## 2016-03-19 VITALS — BP 108/62 | HR 79 | Temp 98.0°F | Resp 14 | Ht 70.0 in

## 2016-03-19 DIAGNOSIS — Z0000 Encounter for general adult medical examination without abnormal findings: Secondary | ICD-10-CM | POA: Diagnosis not present

## 2016-03-19 NOTE — Progress Notes (Signed)
  I have reviewed the above information and agree with above.   Nysha Koplin, MD 

## 2016-03-19 NOTE — Progress Notes (Signed)
Subjective:   Dana Burke is a 71 y.o. female who presents for Medicare Annual (Subsequent) preventive examination.  Review of Systems:  No ROS.  Medicare Wellness Visit.  Cardiac Risk Factors include: advanced age (>3men, >77 women);obesity (BMI >30kg/m2);sedentary lifestyle     Objective:     Vitals: BP 108/62 (BP Location: Right Arm, Patient Position: Sitting, Cuff Size: Normal)   Pulse 79   Temp 98 F (36.7 C) (Oral)   Resp 14   Ht 5\' 10"  (1.778 m)   SpO2 95%   There is no height or weight on file to calculate BMI.   Tobacco History  Smoking Status  . Former Smoker  . Packs/day: 0.40  . Types: Cigarettes  . Quit date: 07/30/1987  Smokeless Tobacco  . Never Used     Counseling given: Not Answered   Past Medical History:  Diagnosis Date  . Depression    recent events  . GERD (gastroesophageal reflux disease)   . Phlebitis and thrombophlebitis of the leg ([place])   . Pulmonary embolism [facility])    Past Surgical History:  Procedure Laterality Date  . APPENDECTOMY  1963  . CHOLECYSTECTOMY  1996  . FRACTURE SURGERY  2001   left tibia  . THYROGLOSSAL DUCT CYST N/A 06/22/2015   Procedure: THYROGLOSSAL DUCT CYST;  Surgeon: Clyde Canterbury, MD;  Location: [facility] ORS;  Service: ENT;  Laterality: N/A;   Family History  Problem Relation Age of Onset  . Heart disease Mother   . Hyperlipidemia Mother   . Dementia Mother   . Heart disease Father   . Hyperlipidemia Father   . Diabetes Father   . Cancer Sister     melanoma  . Cancer Sister     breast  . Cancer Brother     skin cancer  . Cancer Paternal Uncle     colon   History  Sexual Activity  . Sexual activity: No    Outpatient Encounter Prescriptions as of 03/19/2016  Medication Sig  . buPROPion (WELLBUTRIN XL) 300 MG 24 hr tablet TAKE 1 TABLET BY MOUTH ONCE DAILY  . calcium carbonate (OS-CAL) 600 MG TABS Take 600 mg by mouth 2 (two) times daily with a meal.  . cetirizine (ZYRTEC) 10 MG tablet Take 10 mg by  mouth at bedtime.   . Cholecalciferol (VITAMIN D-3) 1000 UNITS CAPS Take 1 capsule by mouth daily.  [name] Sodium (COLACE PO) Take 1 capsule by mouth daily.  [name] gabapentin (NEURONTIN) 300 MG capsule TAKE 1 CAPSULE BY MOUTH 4 TIMES DAILY  . Multiple Vitamins-Minerals (MULTIVITAMIN WITH MINERALS) tablet Take 1 tablet by mouth daily.  [name] omeprazole (PRILOSEC) 20 MG capsule Take 20 mg by mouth daily.   No facility-administered encounter medications on file as of 03/19/2016.     Activities of Daily Living In your present state of health, do you have any difficulty performing the following activities: 03/19/2016 06/22/2015  Hearing? N -  Vision? N -  Difficulty concentrating or making decisions? Y -  Walking or climbing stairs? N -  Dressing or bathing? N -  Doing errands, shopping? N N  Preparing Food and eating ? N -  Using the Toilet? N -  In the past six months, have you accidently leaked urine? N -  Do you have problems with loss of bowel control? N -  Managing your Medications? N -  Managing your Finances? N -  Housekeeping or managing your Housekeeping? N -  Some recent data might  be hidden    Patient Care Team: Crecencio Mc, MD as PCP - General (Internal Medicine) Robert Bellow, MD (General Surgery) Crecencio Mc, MD (Internal Medicine)    Assessment:    This is a routine wellness examination for Dana Burke. The goal of the wellness visit is to assist the patient how to close the gaps in care and create a preventative care plan for the patient.   Taking calcium VIT D as appropriate/Osteoporosis risk reviewed.  Medications reviewed; taking without issues or barriers.  Safety issues reviewed; alarm system with smoke detectors in the home. No firearms in the home. Wears seatbelts when driving or riding with others. Patient does wear sunscreen or protective clothing when in direct sunlight. No violence in the home.  Patient is alert, normal appearance, oriented to  person/place/and time. Correctly identified the president of the [place], recall of 3/3 objects, and performing simple calculations.  Patient displays appropriate judgement and can read correct time from watch face.  No new identified risk were noted.  No failures at ADL's or IADL's.   BMI- discussed the importance of a healthy diet, water intake and exercise. Educational material provided.   Diet: Breakfast: 2 eggs, wheat toast, fruit Lunch: Deli sandwich, fruit Dinner: Meat, green vegetable Daily fluid intake: 1 cup of caffeine, 5-6 cups of water  Dental- every six months.  Dr. Angeline Slim. ([place])  Eye- Visual acuity not assessed per patient preference since they have regular follow up with the ophthalmologist.  Wears corrective lenses.  Sleep patterns- Sleeps 8-9 hours at night.  Wakes feeling rested.  Restless legs have improved with gabapentin.   Health maintenance gaps- closed.  Patient Concerns: None at this time. Follow up with PCP as needed.  Exercise Activities and Dietary recommendations Current Exercise Habits: The patient does not participate in regular exercise at present  Goals    . Increase physical activity          Walk for exercise 30 minutes, 3 times a week. Moderate pace.      Fall Risk Fall Risk  03/19/2016 05/08/2014 05/05/2014  Falls in the past year? No No No   Depression Screen PHQ 2/9 Scores 03/19/2016 05/08/2014 05/05/2014  PHQ - 2 Score 0 0 0     Cognitive Function MMSE - Mini Mental State Exam 03/19/2016  Orientation to time 5  Orientation to Place 5  Registration 3  Attention/ Calculation 5  Recall 3  Language- name 2 objects 2  Language- repeat 1  Language- follow 3 step command 3  Language- read & follow direction 1  Write a sentence 1  Copy design 1  Total score 30        Immunization History  Administered Date(s) Administered  . Influenza, High Dose Seasonal PF 10/29/2012, 12/02/2015  . Influenza,inj,Quad PF,36+ Mos  11/03/2013  . Influenza-Unspecified 10/28/2014  . Pneumococcal Conjugate-13 10/29/2012  . Pneumococcal Polysaccharide-23 05/05/2014  . Tdap 05/01/2013  . Zoster 04/16/2006   Screening Tests Health Maintenance  Topic Date Due  . MAMMOGRAM  10/27/2017  . COLONOSCOPY  10/21/2022  . TETANUS/TDAP  05/02/2023  . INFLUENZA VACCINE  Completed  . DEXA SCAN  Completed  . Hepatitis C Screening  Completed  . PNA vac Low Risk Adult  Completed      Plan:    End of life planning; Advance aging; Advanced directives discussed. Copy of current HCPOA/Living Will requested.    Medicare Attestation I have personally reviewed: The patient's medical and social  history Their use of alcohol, tobacco or illicit drugs Their current medications and supplements The patient's functional ability including ADLs,fall risks, home safety risks, cognitive, and hearing and visual impairment Diet and physical activities Evidence for depression   The patient's weight, height, BMI, and visual acuity have been recorded in the chart.  I have made referrals and provided education to the patient based on review of the above and I have provided the patient with a written personalized care plan for preventive services.    During the course of the visit the patient was educated and counseled about the following appropriate screening and preventive services:   Vaccines to include Pneumoccal, Influenza, Hepatitis B, Td, Zostavax, HCV  Colorectal cancer screening-UTD  Bone density screening-UTD  Glaucoma screening-annual visits with her ophthalmologiast  Mammography-UTD  Nutrition counseling   Patient Instructions (the written plan) was given to the patient.   Varney Biles, LPN  [id number]

## 2016-03-19 NOTE — Patient Instructions (Addendum)
  Dana Burke , Thank you for taking time to come for your Medicare Wellness Visit. I appreciate your ongoing commitment to your health goals. Please review the following plan we discussed and let me know if I can assist you in the future.   Follow up with Dr. Derrel Nip as needed.    Bring a copy of your [place] and/or Living Will to be scanned into chart.  Have a great day!  These are the goals we discussed: Goals    . Increase physical activity          Walk for exercise 30 minutes, 3 times a week. Moderate pace.       This is a list of the screening recommended for you and due dates:  Health Maintenance  Topic Date Due  . Mammogram  10/27/2017  . Colon Cancer Screening  10/21/2022  . Tetanus Vaccine  05/02/2023  . Flu Shot  Completed  . DEXA scan (bone density measurement)  Completed  .  Hepatitis C: One time screening is recommended by Center for Disease Control  (CDC) for  adults born from [birth year] through 1967.   Completed  . Pneumonia vaccines  Completed

## 2016-03-19 NOTE — Progress Notes (Signed)
  I have reviewed the above information and agree with above.   Teresa Tullo, MD 

## 2016-05-31 ENCOUNTER — Ambulatory Visit: Payer: Medicare Other | Admitting: Internal Medicine

## 2016-06-26 ENCOUNTER — Other Ambulatory Visit: Payer: Self-pay | Admitting: Internal Medicine

## 2016-07-03 ENCOUNTER — Other Ambulatory Visit: Payer: Self-pay | Admitting: Internal Medicine

## 2016-07-25 ENCOUNTER — Other Ambulatory Visit: Payer: Self-pay | Admitting: Internal Medicine

## 2016-07-25 ENCOUNTER — Ambulatory Visit: Payer: Medicare Other | Admitting: Internal Medicine

## 2016-09-18 ENCOUNTER — Other Ambulatory Visit: Payer: Self-pay | Admitting: Internal Medicine

## 2016-09-18 DIAGNOSIS — N6012 Diffuse cystic mastopathy of left breast: Secondary | ICD-10-CM

## 2016-09-25 ENCOUNTER — Other Ambulatory Visit: Payer: Self-pay | Admitting: Internal Medicine

## 2016-09-25 DIAGNOSIS — H43393 Other vitreous opacities, bilateral: Secondary | ICD-10-CM | POA: Diagnosis not present

## 2016-09-25 DIAGNOSIS — H524 Presbyopia: Secondary | ICD-10-CM | POA: Diagnosis not present

## 2016-09-25 DIAGNOSIS — H02834 Dermatochalasis of left upper eyelid: Secondary | ICD-10-CM | POA: Diagnosis not present

## 2016-09-25 DIAGNOSIS — Z961 Presence of intraocular lens: Secondary | ICD-10-CM | POA: Diagnosis not present

## 2016-09-25 DIAGNOSIS — H02831 Dermatochalasis of right upper eyelid: Secondary | ICD-10-CM | POA: Diagnosis not present

## 2016-10-29 ENCOUNTER — Ambulatory Visit
Admission: RE | Admit: 2016-10-29 | Discharge: 2016-10-29 | Disposition: A | Discharge status: Home / Self Care | Payer: Medicare Other | Source: Ambulatory Visit | Attending: Internal Medicine | Admitting: Internal Medicine

## 2016-10-29 ENCOUNTER — Ambulatory Visit: Payer: Medicare Other

## 2016-10-29 DIAGNOSIS — R928 Other abnormal and inconclusive findings on diagnostic imaging of breast: Secondary | ICD-10-CM | POA: Diagnosis not present

## 2016-10-29 DIAGNOSIS — N6012 Diffuse cystic mastopathy of left breast: Secondary | ICD-10-CM

## 2016-11-13 DIAGNOSIS — L820 Inflamed seborrheic keratosis: Secondary | ICD-10-CM | POA: Diagnosis not present

## 2016-11-13 DIAGNOSIS — D225 Melanocytic nevi of trunk: Secondary | ICD-10-CM | POA: Diagnosis not present

## 2016-11-13 DIAGNOSIS — D1801 Hemangioma of skin and subcutaneous tissue: Secondary | ICD-10-CM | POA: Diagnosis not present

## 2016-11-13 DIAGNOSIS — L720 Epidermal cyst: Secondary | ICD-10-CM | POA: Diagnosis not present

## 2016-11-13 DIAGNOSIS — Z1283 Encounter for screening for malignant neoplasm of skin: Secondary | ICD-10-CM | POA: Diagnosis not present

## 2016-11-13 DIAGNOSIS — B353 Tinea pedis: Secondary | ICD-10-CM | POA: Diagnosis not present

## 2016-11-13 DIAGNOSIS — D2230 Melanocytic nevi of unspecified part of face: Secondary | ICD-10-CM | POA: Diagnosis not present

## 2016-11-13 DIAGNOSIS — L821 Other seborrheic keratosis: Secondary | ICD-10-CM | POA: Diagnosis not present

## 2016-11-13 DIAGNOSIS — Z808 Family history of malignant neoplasm of other organs or systems: Secondary | ICD-10-CM | POA: Diagnosis not present

## 2016-11-13 DIAGNOSIS — L918 Other hypertrophic disorders of the skin: Secondary | ICD-10-CM | POA: Diagnosis not present

## 2016-11-13 DIAGNOSIS — L814 Other melanin hyperpigmentation: Secondary | ICD-10-CM | POA: Diagnosis not present

## 2016-11-28 ENCOUNTER — Other Ambulatory Visit: Payer: Self-pay | Admitting: Internal Medicine

## 2016-11-28 ENCOUNTER — Ambulatory Visit (INDEPENDENT_AMBULATORY_CARE_PROVIDER_SITE_OTHER): Payer: Medicare Other

## 2016-11-28 DIAGNOSIS — Z23 Encounter for immunization: Secondary | ICD-10-CM | POA: Diagnosis not present

## 2016-11-28 NOTE — Telephone Encounter (Signed)
Refilled: 09/25/2016 Last OV: 12/02/2015 Next OV: 02/11/2017

## 2016-11-29 NOTE — Telephone Encounter (Signed)
Refill for 30 days only.  OFFICE VISIT NEEDED prior to any more refills 

## 2016-12-03 ENCOUNTER — Encounter: Payer: Medicare Other | Admitting: Internal Medicine

## 2016-12-07 ENCOUNTER — Other Ambulatory Visit: Payer: Self-pay | Admitting: Internal Medicine

## 2016-12-14 NOTE — Telephone Encounter (Signed)
Pt has an appt in January

## 2017-01-01 ENCOUNTER — Other Ambulatory Visit: Payer: Self-pay | Admitting: Internal Medicine

## 2017-02-11 ENCOUNTER — Encounter: Payer: Self-pay | Admitting: Internal Medicine

## 2017-02-11 ENCOUNTER — Ambulatory Visit (INDEPENDENT_AMBULATORY_CARE_PROVIDER_SITE_OTHER): Payer: Medicare Other | Admitting: Internal Medicine

## 2017-02-11 VITALS — BP 122/84 | HR 86 | Temp 98.3°F | Resp 15 | Ht 70.0 in | Wt 245.0 lb

## 2017-02-11 DIAGNOSIS — Z1211 Encounter for screening for malignant neoplasm of colon: Secondary | ICD-10-CM

## 2017-02-11 DIAGNOSIS — E559 Vitamin D deficiency, unspecified: Secondary | ICD-10-CM | POA: Diagnosis not present

## 2017-02-11 DIAGNOSIS — E785 Hyperlipidemia, unspecified: Secondary | ICD-10-CM

## 2017-02-11 DIAGNOSIS — Z87898 Personal history of other specified conditions: Secondary | ICD-10-CM

## 2017-02-11 DIAGNOSIS — G629 Polyneuropathy, unspecified: Secondary | ICD-10-CM

## 2017-02-11 DIAGNOSIS — F321 Major depressive disorder, single episode, moderate: Secondary | ICD-10-CM | POA: Diagnosis not present

## 2017-02-11 DIAGNOSIS — G4700 Insomnia, unspecified: Secondary | ICD-10-CM

## 2017-02-11 DIAGNOSIS — R5383 Other fatigue: Secondary | ICD-10-CM

## 2017-02-11 LAB — COMPREHENSIVE METABOLIC PANEL
ALK PHOS: 80 U/L (ref 39–117)
ALT: 16 U/L (ref 0–35)
AST: 14 U/L (ref 0–37)
Albumin: 4.3 g/dL (ref 3.5–5.2)
BILIRUBIN TOTAL: 0.5 mg/dL (ref 0.2–1.2)
BUN: 15 mg/dL (ref 6–23)
CO2: 30 meq/L (ref 19–32)
CREATININE: 0.94 mg/dL (ref 0.40–1.20)
Calcium: 10.3 mg/dL (ref 8.4–10.5)
Chloride: 105 mEq/L (ref 96–112)
GFR: 61.89 mL/min (ref >60.00)
GLUCOSE: 96 mg/dL (ref 70–99)
Potassium: 4.5 mEq/L (ref 3.5–5.1)
Sodium: 142 mEq/L (ref 135–145)
TOTAL PROTEIN: 7.1 g/dL (ref 6.0–8.3)

## 2017-02-11 LAB — LIPID PANEL
CHOL/HDL RATIO: 6
Cholesterol: 183 mg/dL (ref 0–200)
HDL: 32 mg/dL — ABNORMAL LOW (ref >39.00)
LDL Cholesterol: 129 mg/dL — ABNORMAL HIGH (ref 0–99)
NONHDL: 151.47
Triglycerides: 111 mg/dL (ref 0.0–149.0)
VLDL: 22.2 mg/dL (ref 0.0–40.0)

## 2017-02-11 LAB — VITAMIN D 25 HYDROXY (VIT D DEFICIENCY, FRACTURES): VITD: 28.81 ng/mL — AB (ref 30.00–100.00)

## 2017-02-11 LAB — TSH: TSH: 0.91 u[IU]/mL (ref 0.35–4.50)

## 2017-02-11 LAB — VITAMIN B12: VITAMIN B 12: 354 pg/mL (ref 211–911)

## 2017-02-11 MED ORDER — ZOSTER VAC RECOMB ADJUVANTED 50 MCG/0.5ML IM SUSR: 0.5000 mL | Freq: Once | INTRAMUSCULAR | 1 refills | Status: AC

## 2017-02-11 MED ORDER — BUPROPION HCL ER (XL) 300 MG PO TB24: 300.0000 mg | ORAL_TABLET | Freq: Every day | ORAL | 1 refills | Status: DC

## 2017-02-11 MED ORDER — GABAPENTIN 300 MG PO CAPS: ORAL_CAPSULE | ORAL | 1 refills | Status: DC

## 2017-02-11 MED ORDER — ESCITALOPRAM OXALATE 10 MG PO TABS: 10.0000 mg | ORAL_TABLET | Freq: Every day | ORAL | 2 refills | Status: DC

## 2017-02-11 NOTE — Patient Instructions (Signed)
TODAY WE ADDED 10 MG LEXAPRO FOR YOUR MOOD.    Take it in the evening wit dinner.  Continue wellbutrin in the am  We also added 300 mg to your bedtime dose of neurontin  To see if it helps your cold feet!   The ShingRx vaccine is now available in local pharmacies and is much more protective thant Zostavaxs,  It is therefore ADVISED for all interested adults over 18 to prevent shingles     Health Maintenance for Postmenopausal Women Menopause is a normal process in which your reproductive ability comes to an end. This process happens gradually over a span of months to years, usually between the ages of 60 and 74. Menopause is complete when you have missed 12 consecutive menstrual periods. It is important to talk with your health care provider about some of the most common conditions that affect postmenopausal women, such as heart disease, cancer, and bone loss (osteoporosis). Adopting a healthy lifestyle and getting preventive care can help to promote your health and wellness. Those actions can also lower your chances of developing some of these common conditions. What should I know about menopause? During menopause, you may experience a number of symptoms, such as:  Moderate-to-severe hot flashes.  Night sweats.  Decrease in sex drive.  Mood swings.  Headaches.  Tiredness.  Irritability.  Memory problems.  Insomnia.  Choosing to treat or not to treat menopausal changes is an individual decision that you make with your health care provider. What should I know about hormone replacement therapy and supplements? Hormone therapy products are effective for treating symptoms that are associated with menopause, such as hot flashes and night sweats. Hormone replacement carries certain risks, especially as you become older. If you are thinking about using estrogen or estrogen with progestin treatments, discuss the benefits and risks with your health care provider. What should I know about  heart disease and stroke? Heart disease, heart attack, and stroke become more likely as you age. This may be due, in part, to the hormonal changes that your body experiences during menopause. These can affect how your body processes dietary fats, triglycerides, and cholesterol. Heart attack and stroke are both medical emergencies. There are many things that you can do to help prevent heart disease and stroke:  Have your blood pressure checked at least every 1-2 years. High blood pressure causes heart disease and increases the risk of stroke.  If you are 51-110 years old, ask your health care provider if you should take aspirin to prevent a heart attack or a stroke.  Do not use any tobacco products, including cigarettes, chewing tobacco, or electronic cigarettes. If you need help quitting, ask your health care provider.  It is important to eat a healthy diet and maintain a healthy weight. ? Be sure to include plenty of vegetables, fruits, low-fat dairy products, and lean protein. ? Avoid eating foods that are high in solid fats, added sugars, or salt (sodium).  Get regular exercise. This is one of the most important things that you can do for your health. ? Try to exercise for at least 150 minutes each week. The type of exercise that you do should increase your heart rate and make you sweat. This is known as moderate-intensity exercise. ? Try to do strengthening exercises at least twice each week. Do these in addition to the moderate-intensity exercise.  Know your numbers.Ask your health care provider to check your cholesterol and your blood glucose. Continue to have your blood tested  as directed by your health care provider.  What should I know about cancer screening? There are several types of cancer. Take the following steps to reduce your risk and to catch any cancer development as early as possible. Breast Cancer  Practice breast self-awareness. ? This means understanding how your  breasts normally appear and feel. ? It also means doing regular breast self-exams. Let your health care provider know about any changes, no matter how small.  If you are 27 or older, have a clinician do a breast exam (clinical breast exam or CBE) every year. Depending on your age, family history, and medical history, it may be recommended that you also have a yearly breast X-ray (mammogram).  If you have a family history of breast cancer, talk with your health care provider about genetic screening.  If you are at high risk for breast cancer, talk with your health care provider about having an MRI and a mammogram every year.  Breast cancer (BRCA) gene test is recommended for women who have family members with BRCA-related cancers. Results of the assessment will determine the need for genetic counseling and BRCA1 and for BRCA2 testing. BRCA-related cancers include these types: ? Breast. This occurs in males or females. ? Ovarian. ? Tubal. This may also be called fallopian tube cancer. ? Cancer of the abdominal or pelvic lining (peritoneal cancer). ? Prostate. ? Pancreatic.  Cervical, Uterine, and Ovarian Cancer Your health care provider may recommend that you be screened regularly for cancer of the pelvic organs. These include your ovaries, uterus, and vagina. This screening involves a pelvic exam, which includes checking for microscopic changes to the surface of your cervix (Pap test).  For women ages 21-65, health care providers may recommend a pelvic exam and a Pap test every three years. For women ages 4-65, they may recommend the Pap test and pelvic exam, combined with testing for human papilloma virus (HPV), every five years. Some types of HPV increase your risk of cervical cancer. Testing for HPV may also be done on women of any age who have unclear Pap test results.  Other health care providers may not recommend any screening for nonpregnant women who are considered low risk for pelvic  cancer and have no symptoms. Ask your health care provider if a screening pelvic exam is right for you.  If you have had past treatment for cervical cancer or a condition that could lead to cancer, you need Pap tests and screening for cancer for at least 20 years after your treatment. If Pap tests have been discontinued for you, your risk factors (such as having a new sexual partner) need to be reassessed to determine if you should start having screenings again. Some women have medical problems that increase the chance of getting cervical cancer. In these cases, your health care provider may recommend that you have screening and Pap tests more often.  If you have a family history of uterine cancer or ovarian cancer, talk with your health care provider about genetic screening.  If you have vaginal bleeding after reaching menopause, tell your health care provider.  There are currently no reliable tests available to screen for ovarian cancer.  Lung Cancer Lung cancer screening is recommended for adults 58-28 years old who are at high risk for lung cancer because of a history of smoking. A yearly low-dose CT scan of the lungs is recommended if you:  Currently smoke.  Have a history of at least 30 pack-years of smoking and you  currently smoke or have quit within the past 15 years. A pack-year is smoking an average of one pack of cigarettes per day for one year.  Yearly screening should:  Continue until it has been 15 years since you quit.  Stop if you develop a health problem that would prevent you from having lung cancer treatment.  Colorectal Cancer  This type of cancer can be detected and can often be prevented.  Routine colorectal cancer screening usually begins at age 75 and continues through age 17.  If you have risk factors for colon cancer, your health care provider may recommend that you be screened at an earlier age.  If you have a family history of colorectal cancer, talk with  your health care provider about genetic screening.  Your health care provider may also recommend using home test kits to check for hidden blood in your stool.  A small camera at the end of a tube can be used to examine your colon directly (sigmoidoscopy or colonoscopy). This is done to check for the earliest forms of colorectal cancer.  Direct examination of the colon should be repeated every 5-10 years until age 18. However, if early forms of precancerous polyps or small growths are found or if you have a family history or genetic risk for colorectal cancer, you may need to be screened more often.  Skin Cancer  Check your skin from head to toe regularly.  Monitor any moles. Be sure to tell your health care provider: ? About any new moles or changes in moles, especially if there is a change in a mole's shape or color. ? If you have a mole that is larger than the size of a pencil eraser.  If any of your family members has a history of skin cancer, especially at a young age, talk with your health care provider about genetic screening.  Always use sunscreen. Apply sunscreen liberally and repeatedly throughout the day.  Whenever you are outside, protect yourself by wearing long sleeves, pants, a wide-brimmed hat, and sunglasses.  What should I know about osteoporosis? Osteoporosis is a condition in which bone destruction happens more quickly than new bone creation. After menopause, you may be at an increased risk for osteoporosis. To help prevent osteoporosis or the bone fractures that can happen because of osteoporosis, the following is recommended:  If you are 73-75 years old, get at least 1,000 mg of calcium and at least 600 mg of vitamin D per day.  If you are older than age 72 but younger than age 61, get at least 1,200 mg of calcium and at least 600 mg of vitamin D per day.  If you are older than age 24, get at least 1,200 mg of calcium and at least 800 mg of vitamin D per  day.  Smoking and excessive alcohol intake increase the risk of osteoporosis. Eat foods that are rich in calcium and vitamin D, and do weight-bearing exercises several times each week as directed by your health care provider. What should I know about how menopause affects my mental health? Depression may occur at any age, but it is more common as you become older. Common symptoms of depression include:  Low or sad mood.  Changes in sleep patterns.  Changes in appetite or eating patterns.  Feeling an overall lack of motivation or enjoyment of activities that you previously enjoyed.  Frequent crying spells.  Talk with your health care provider if you think that you are experiencing depression. What  should I know about immunizations? It is important that you get and maintain your immunizations. These include:  Tetanus, diphtheria, and pertussis (Tdap) booster vaccine.  Influenza every year before the flu season begins.  Pneumonia vaccine.  Shingles vaccine.  Your health care provider may also recommend other immunizations. This information is not intended to replace advice given to you by your health care provider. Make sure you discuss any questions you have with your health care provider. Document Released: 02/23/2005 Document Revised: 07/22/2015 Document Reviewed: 10/05/2014 Elsevier Interactive Patient Education  2018 [employer].

## 2017-02-11 NOTE — Progress Notes (Addendum)
Patient ID: Dana Burke, female    DOB: 11-11-1945  Age: 72 y.o. MRN: [id number]  The patient is here for follow up and  management of other chronic and acute problems.  Last seen Nov 2017   Colonoscopy  2014: diverticulosis and hyperplastic polyps. 10 yr follow up  Mammogram normal Oct 2018  The risk factors are reflected in the social history.  The roster of all physicians providing medical care to patient - is listed in the Snapshot section of the chart.  Activities of daily living:  The patient is 100% independent in all ADLs: dressing, toileting, feeding as well as independent mobility  Home safety : The patient has smoke detectors in the home. They wear seatbelts.  There are no firearms at home. There is no violence in the home.   There is no risks for hepatitis, STDs or HIV. There is no   history of blood transfusion. They have no travel history to infectious disease endemic areas of the world.  The patient has seen their dentist in the last six month. They have seen their eye doctor in the last year. They admit to slight hearing difficulty with regard to whispered voices and some television programs.  They have deferred audiologic testing in the last year.  They do not  have excessive sun exposure. Discussed the need for sun protection: hats, long sleeves and use of sunscreen if there is significant sun exposure.   Diet: the importance of a healthy diet is discussed. They do have a healthy diet.  The benefits of regular aerobic exercise were discussed. She walks 4 times per week ,  20 minutes.   Depression screen: there are no signs or vegative symptoms of depression- irritability, change in appetite, anhedonia, sadness/tearfullness.  Cognitive assessment: the patient manages all their financial and personal affairs and is actively engaged. They could relate day,date,year and events; recalled 2/3 objects at 3 minutes; performed clock-face test normally.  The following portions  of the patient's history were reviewed and updated as appropriate: allergies, current medications, past family history, past medical history,  past surgical history, past social history  and problem list.  Visual acuity was not assessed per patient preference since she has regular follow up with her ophthalmologist. Hearing and body mass index were assessed and reviewed.   During the course of the visit the patient was educated and counseled about appropriate screening and preventive services including : fall prevention , diabetes screening, nutrition counseling, colorectal cancer screening, and recommended immunizations.    CC: The primary encounter diagnosis was Hyperlipidemia LDL goal <130. Diagnoses of Fatigue, unspecified type, Vitamin D deficiency, Current moderate episode of major depressive disorder without prior episode ([place]), History of abnormal mammogram, Neuropathy, Special screening for malignant neoplasms, colon, and Insomnia, unspecified type were also pertinent to this visit. 1) increased emotional lability.  crying more often.  Not socializing much.  Not sure why, hroband has been dead for over 4 years,  Life is good.   adding lexapro   2)  foot neuropathy.  Feet now feeling cold at night,   Using neurontin    History Dana Burke has a past medical history of Depression, GERD (gastroesophageal reflux disease), Phlebitis and thrombophlebitis of the leg, and Pulmonary embolism ([place]).   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   Her family history includes Breast cancer in her cousin, cousin, cousin, cousin, cousin, and sister; Cancer  in her brother, paternal uncle, sister, and sister; Dementia in her mother; Diabetes in her father; Heart disease in her father and mother; Hyperlipidemia in her father and mother.She reports that she quit smoking about 29 years ago. Her  smoking use included cigarettes. She smoked 0.40 packs per day. she has never used smokeless tobacco. She reports that she does not drink alcohol or use drugs.  Outpatient Medications Prior to Visit  Medication Sig Dispense Refill  . calcium carbonate (OS-CAL) 600 MG TABS Take 600 mg by mouth 2 (two) times daily with a meal.    . cetirizine (ZYRTEC) 10 MG tablet Take 10 mg by mouth at bedtime.     . Cholecalciferol (VITAMIN D-3) 1000 UNITS CAPS Take 1 capsule by mouth daily.    Dana Burke Sodium (COLACE PO) Take 1 capsule by mouth daily.    . Multiple Vitamins-Minerals (MULTIVITAMIN WITH MINERALS) tablet Take 1 tablet by mouth daily.    [name] omeprazole (PRILOSEC) 20 MG capsule Take 20 mg by mouth daily.    [name] buPROPion (WELLBUTRIN XL) 300 MG 24 hr tablet TAKE 1 TABLET BY MOUTH ONCE DAILY 30 tablet 3  . gabapentin (NEURONTIN) 300 MG capsule TAKE 1 CAPSULE BY MOUTH 4 TIMES DAILY 120 capsule 5  . gabapentin (NEURONTIN) 300 MG capsule TAKE 1 CAPSULE BY MOUTH 4 TIMES DAILY 120 capsule 1   No facility-administered medications prior to visit.     Review of Systems   Patient denies headache, fevers, malaise, unintentional weight loss, skin rash, eye pain, sinus congestion and sinus pain, sore throat, dysphagia,  hemoptysis , cough, dyspnea, wheezing, chest pain, palpitations, orthopnea, edema, abdominal pain, nausea, melena, diarrhea, constipation, flank pain, dysuria, hematuria, urinary  Frequency, nocturia, numbness, tingling, seizures,  Focal weakness, Loss of consciousness,  Tremor,  anxiety, and suicidal ideation.      Objective:  BP 122/84 (BP Location: Left Arm, Patient Position: Sitting, Cuff Size: Large)   Pulse 86   Temp 98.3 F (36.8 C) (Oral)   Resp 15   Ht 5\' 10"  (1.778 m)   Wt 245 lb (111.1 kg)   SpO2 95%   BMI 35.15 kg/m   Physical Exam  General appearance: alert, cooperative and appears stated age Head: Normocephalic, without obvious abnormality, atraumatic Eyes:  conjunctivae/corneas clear. PERRL, EOM's intact. Fundi benign. Ears: normal TM's and external ear canals both ears Nose: Nares normal. Septum midline. Mucosa normal. No drainage or sinus tenderness. Throat: lips, mucosa, and tongue normal; teeth and gums normal Neck: no adenopathy, no carotid bruit, no JVD, supple, symmetrical, trachea midline and thyroid not enlarged, symmetric, no tenderness/mass/nodules Lungs: clear to auscultation bilaterally Breasts: normal appearance, no masses or tenderness Heart: regular rate and rhythm, S1, S2 normal, no murmur, click, rub or gallop Abdomen: soft, non-tender; bowel sounds normal; no masses,  no organomegaly Extremities: extremities normal, atraumatic, no cyanosis or edema Pulses: 2+ and symmetric Skin: Skin color, texture, turgor normal. No rashes or lesions Neurologic: Alert and oriented X 3, normal strength and tone. Normal symmetric reflexes. Normal coordination and gait.     Assessment & Plan:   Problem List Items Addressed This Visit    History of abnormal mammogram    mammogram ordered.  Breast exam done      Insomnia    Aggravated by snoring,  May have OSA but has deferred sleep study and sedating meds. Discussed natural remedies for insomnia including herbal tea and melatonin.  Reviewed principles of good sleep hygiene  Major depressive disorder, single episode    Symptoms worsening despite taking Wellbutrin dose to 300 mg daily ,  Adding lexapro      Relevant Medications   buPROPion (WELLBUTRIN XL) 300 MG 24 hr tablet   escitalopram (LEXAPRO) 10 MG tablet   Neuropathy    Exam was normal,  screening for thyroid, B12 deficiencies,  Syphilis and DM  negative. Will Increase gabapentin to 600 mg at bedtime        Special screening for malignant neoplasms, colon    Next colonoscopy due in 2024 , she may decide to use Cologuard at that point due to age.       Hyperlipidemia - Primary    Ten year risk of CAD is 12%,  Statin  therapy advised.  Lab Results  Component Value Date   CHOL 183 02/11/2017   HDL 32.00 (L) 02/11/2017   LDLCALC 129 (H) 02/11/2017   LDLDIRECT 160.0 12/02/2015   TRIG 111.0 02/11/2017   CHOLHDL 6 02/11/2017          Other Visit Diagnoses    Fatigue, unspecified type       Relevant Orders   Comprehensive metabolic panel (Completed)   Vitamin B12 (Completed)   TSH (Completed)   Vitamin D deficiency       Relevant Orders   VITAMIN D 25 Hydroxy (Vit-D Deficiency, Fractures) (Completed)     A total of 40 minutes was spent with patient more than half of which was spent in counseling patient on the above mentioned issues , reviewing and explaining recent labs and imaging studies done, and coordination of care. I have discontinued Dana Burke's gabapentin. I have also changed her gabapentin and buPROPion. Additionally, I am having her start on escitalopram and Zoster Vaccine Adjuvanted. Lastly, I am having her maintain her omeprazole, multivitamin with minerals, calcium carbonate, Vitamin D-3, cetirizine, and Docusate Sodium (COLACE PO).  Meds ordered this encounter  Medications  . gabapentin (NEURONTIN) 300 MG capsule    Sig: TAKE 1 CAPSULE BY MOUTH 5 TIMES DAILY    Dispense:  160 capsule    Refill:  1  . buPROPion (WELLBUTRIN XL) 300 MG 24 hr tablet    Sig: Take 1 tablet (300 mg total) by mouth daily.    Dispense:  90 tablet    Refill:  1  . escitalopram (LEXAPRO) 10 MG tablet    Sig: Take 1 tablet (10 mg total) by mouth daily.    Dispense:  30 tablet    Refill:  2  . Zoster Vaccine Adjuvanted [facility]) injection    Sig: Inject 0.5 mLs into the muscle once for 1 dose.    Dispense:  1 each    Refill:  1   A total of 40 minutes was spent with patient more than half of which was spent in counseling patient on the above mentioned issues , reviewing and explaining recent labs and imaging studies done, and coordination of care.  Medications Discontinued During This Encounter   Medication Reason  . gabapentin (NEURONTIN) 300 MG capsule   . gabapentin (NEURONTIN) 300 MG capsule   . buPROPion (WELLBUTRIN XL) 300 MG 24 hr tablet Reorder    Follow-up: Return in about 6 months (around 08/11/2017), or depression.   Crecencio Mc, MD

## 2017-02-12 NOTE — Assessment & Plan Note (Signed)
mammogram ordered.  Breast exam done

## 2017-02-12 NOTE — Assessment & Plan Note (Addendum)
Next colonoscopy due in 2024 , she may decide to use Cologuard at that point due to age.

## 2017-02-12 NOTE — Assessment & Plan Note (Signed)
Aggravated by snoring,  May have OSA but has deferred sleep study and sedating meds. Discussed natural remedies for insomnia including herbal tea and melatonin.  Reviewed principles of good sleep hygiene

## 2017-02-12 NOTE — Assessment & Plan Note (Signed)
Symptoms worsening despite taking Wellbutrin dose to 300 mg daily ,  Adding lexapro

## 2017-02-12 NOTE — Assessment & Plan Note (Signed)
Exam was normal,  screening for thyroid, B12 deficiencies,  Syphilis and DM  negative. Will Increase gabapentin to 600 mg at bedtime

## 2017-02-13 ENCOUNTER — Encounter: Payer: Self-pay | Admitting: Internal Medicine

## 2017-02-13 NOTE — Assessment & Plan Note (Signed)
Ten year risk of CAD is 12%,  Statin therapy advised.  Lab Results  Component Value Date   CHOL 183 02/11/2017   HDL 32.00 (L) 02/11/2017   LDLCALC 129 (H) 02/11/2017   LDLDIRECT 160.0 12/02/2015   TRIG 111.0 02/11/2017   CHOLHDL 6 02/11/2017

## 2017-02-14 ENCOUNTER — Encounter: Payer: Self-pay | Admitting: *Deleted

## 2017-02-14 ENCOUNTER — Other Ambulatory Visit: Payer: Self-pay | Admitting: Internal Medicine

## 2017-02-14 DIAGNOSIS — Z79899 Other long term (current) drug therapy: Secondary | ICD-10-CM

## 2017-02-14 MED ORDER — SIMVASTATIN 20 MG PO TABS
20.0000 mg | ORAL_TABLET | Freq: Every day | ORAL | 0 refills | Status: DC
Start: 2017-02-14 — End: 2017-02-14

## 2017-03-08 ENCOUNTER — Other Ambulatory Visit (INDEPENDENT_AMBULATORY_CARE_PROVIDER_SITE_OTHER): Payer: Medicare Other

## 2017-03-08 DIAGNOSIS — Z79899 Other long term (current) drug therapy: Secondary | ICD-10-CM

## 2017-03-08 LAB — COMPREHENSIVE METABOLIC PANEL
ALT: 16 U/L (ref 0–35)
AST: 14 U/L (ref 0–37)
Albumin: 4.1 g/dL (ref 3.5–5.2)
Alkaline Phosphatase: 77 U/L (ref 39–117)
BILIRUBIN TOTAL: 0.3 mg/dL (ref 0.2–1.2)
BUN: 18 mg/dL (ref 6–23)
CO2: 30 meq/L (ref 19–32)
CREATININE: 0.89 mg/dL (ref 0.40–1.20)
Calcium: 10.2 mg/dL (ref 8.4–10.5)
Chloride: 104 mEq/L (ref 96–112)
GFR: 65.91 mL/min (ref >60.00)
GLUCOSE: 82 mg/dL (ref 70–99)
Potassium: 4.4 mEq/L (ref 3.5–5.1)
SODIUM: 139 meq/L (ref 135–145)
Total Protein: 6.7 g/dL (ref 6.0–8.3)

## 2017-03-11 ENCOUNTER — Other Ambulatory Visit: Payer: Self-pay | Admitting: Internal Medicine

## 2017-03-11 ENCOUNTER — Encounter: Payer: Self-pay | Admitting: *Deleted

## 2017-03-11 DIAGNOSIS — E7800 Pure hypercholesterolemia, unspecified: Secondary | ICD-10-CM

## 2017-03-20 ENCOUNTER — Ambulatory Visit: Payer: Medicare Other

## 2017-04-08 ENCOUNTER — Other Ambulatory Visit: Payer: Self-pay | Admitting: Internal Medicine

## 2017-04-23 ENCOUNTER — Ambulatory Visit (INDEPENDENT_AMBULATORY_CARE_PROVIDER_SITE_OTHER): Payer: Medicare Other

## 2017-04-23 VITALS — BP 110/64 | HR 73 | Temp 98.4°F | Resp 15 | Ht 69.0 in | Wt 246.0 lb

## 2017-04-23 DIAGNOSIS — Z0000 Encounter for general adult medical examination without abnormal findings: Secondary | ICD-10-CM

## 2017-04-23 NOTE — Progress Notes (Signed)
Subjective:   Dana Burke is a 72 y.o. female who presents for Medicare Annual/Subsequent preventive examination.  Review of Systems:  No ROS.  Medicare Wellness Visit. Additional risk factors are reflected in the social history.  Cardiac Risk Factors include: advanced age (>37men, >68 women);obesity (BMI >30kg/m2)     Objective:    Vitals: BP 110/64 (BP Location: Left Arm, Patient Position: Sitting, Cuff Size: Normal)   Pulse 73   Temp 98.4 F (36.9 C) (Oral)   Resp 15   Ht 5\' 9"  (1.753 m)   Wt 246 lb (111.6 kg)   SpO2 96%   BMI 36.33 kg/m   Body mass index is 36.33 kg/m.  Advanced Directives 04/23/2017 03/19/2016 06/22/2015 06/20/2015  Does Patient Have a Medical Advance Directive? Yes Yes Yes Yes  Type of Paramedic of [place];Living will [place];Living will [place];Living will [place];Living will  Does patient want to make changes to medical advance directive? No - Patient declined No - Patient declined No - Patient declined -  Copy of [place] in Chart? No - copy requested No - copy requested No - copy requested No - copy requested    Tobacco Social History   Tobacco Use  Smoking Status Former Smoker  . Packs/day: 0.40  . Types: Cigarettes  . Last attempt to quit: 07/30/1987  . Years since quitting: 29.7  Smokeless Tobacco Never Used     Counseling given: Not Answered   Clinical Intake:  Pre-visit preparation completed: Yes  Pain : No/denies pain     Nutritional Status: BMI > 30  Obese Diabetes: No  How often do you need to have someone help you when you read instructions, pamphlets, or other written materials from your doctor or pharmacy?: 1 - Never  Interpreter Needed?: No     Past Medical History:  Diagnosis Date  . Depression    recent events  . GERD (gastroesophageal reflux disease)   . Phlebitis and thrombophlebitis of the leg   .  Pulmonary embolism [facility])    Past Surgical History:  Procedure Laterality Date  . APPENDECTOMY  1963  . CHOLECYSTECTOMY  1996  . FRACTURE SURGERY  2001   left tibia  . THYROGLOSSAL DUCT CYST N/A 06/22/2015   Procedure: THYROGLOSSAL DUCT CYST;  Surgeon: [place] Canterbury, MD;  Location: [facility] ORS;  Service: ENT;  Laterality: N/A;   Family History  Problem Relation Age of Onset  . Heart disease Mother   . Hyperlipidemia Mother   . Dementia Mother   . Heart disease Father   . Hyperlipidemia Father   . Diabetes Father   . Cancer Sister        melanoma  . Cancer Sister        breast  . Breast cancer Sister   . Cancer Brother        skin cancer  . Bladder Cancer Brother   . Diabetes Brother   . Cancer Paternal Uncle        colon  . Breast cancer Cousin   . Breast cancer Cousin   . Breast cancer Cousin   . Breast cancer Cousin   . Breast cancer Cousin   . Seizures Grandchild   . Diabetes Brother   . Diabetes Brother    Social History   Socioeconomic History  . Marital status: Widowed    Spouse name: Not on file  . Number of children: Not on  file  . Years of education: Not on file  . Highest education level: Not on file  Occupational History  . Not on file  Social Needs  . Financial resource strain: Not hard at all  . Food insecurity:    Worry: Never true    Inability: Never true  . Transportation needs:    Medical: No    Non-medical: No  Tobacco Use  . Smoking status: Former Smoker    Packs/day: 0.40    Types: Cigarettes    Last attempt to quit: 07/30/1987    Years since quitting: 29.7  . Smokeless tobacco: Never Used  Substance and Sexual Activity  . Alcohol use: No  . Drug use: No  . Sexual activity: Never  Lifestyle  . Physical activity:    Days per week: 0 days    Minutes per session: Not on file  . Stress: Not on file  Relationships  . Social connections:    Talks on phone: Not on file    Gets together: Not on file    Attends religious service: Not on  file    Active member of club or organization: Not on file    Attends meetings of clubs or organizations: Not on file    Relationship status: Not on file  Other Topics Concern  . Not on file  Social History Narrative  . Not on file    Outpatient Encounter Medications as of 04/23/2017  Medication Sig  . buPROPion (WELLBUTRIN XL) 300 MG 24 hr tablet Take 1 tablet (300 mg total) by mouth daily.  . calcium carbonate (OS-CAL) 600 MG TABS Take 600 mg by mouth 2 (two) times daily with a meal.  . cetirizine (ZYRTEC) 10 MG tablet Take 10 mg by mouth at bedtime.   . Cholecalciferol (VITAMIN D-3) 1000 UNITS CAPS Take 1 capsule by mouth daily.  [name] Sodium (COLACE PO) Take 1 capsule by mouth daily.  [name] escitalopram (LEXAPRO) 10 MG tablet TAKE 1 TABLET BY MOUTH ONCE DAILY  . gabapentin (NEURONTIN) 300 MG capsule TAKE 1 CAPSULE BY MOUTH 5 TIMES DAILY  . Melatonin 1 MG TABS Take 1 tablet by mouth.  . Multiple Vitamins-Minerals (MULTIVITAMIN WITH MINERALS) tablet Take 1 tablet by mouth daily.  [name] omeprazole (PRILOSEC) 20 MG capsule Take 20 mg by mouth daily.  . simvastatin (ZOCOR) 20 MG tablet TAKE 1 TABLET BY MOUTH AT BEDTIME.   No facility-administered encounter medications on file as of 04/23/2017.     Activities of Daily Living In your present state of health, do you have any difficulty performing the following activities: 04/23/2017  Hearing? N  Vision? N  Difficulty concentrating or making decisions? N  Walking or climbing stairs? Y  Comment Left leg pain, intermittent  Dressing or bathing? N  Doing errands, shopping? N  Preparing Food and eating ? N  Using the Toilet? N  In the past six months, have you accidently leaked urine? N  Do you have problems with loss of bowel control? N  Managing your Medications? N  Managing your Finances? N  Housekeeping or managing your Housekeeping? N  Some recent data might be hidden    Patient Care Team: Crecencio Mc, MD as PCP - General  (Internal Medicine) Bary Castilla, Forest Gleason, MD (General Surgery) Crecencio Mc, MD (Internal Medicine)   Assessment:   This is a routine wellness examination for Dana Burke. The goal of the wellness visit is to assist the patient how to close the gaps in  care and create a preventative care plan for the patient.   The roster of all physicians providing medical care to patient is listed in the Snapshot section of the chart.  Taking calcium VIT D as appropriate/Osteoporosis risk reviewed.    Safety issues reviewed; Smoke and carbon monoxide detectors in the home. No firearms in the home. Wears seatbelts when driving or riding with others. No violence in the home.  They do not have excessive sun exposure.  Discussed the need for sun protection: hats, long sleeves and the use of sunscreen if there is significant sun exposure.  Patient is alert, normal appearance, oriented to person/place/and time.  Correctly identified the president of the [place] and recalls of 3/3 words. Performs simple calculations and can read correct time from watch face. Displays appropriate judgement.  No new identified risk were noted.  No failures at ADL's or IADL's.    BMI- discussed the importance of a healthy diet, water intake and the benefits of aerobic exercise. Educational material provided.   24 hour diet recall: Regular diet.   Low carb, plant based diet encouraged.   Dental- every 6 months.  Eye- Visual acuity not assessed per patient preference since they have regular follow up with the ophthalmologist.  Wears corrective lenses.  Sleep patterns- Sleeps 8 hours at night.  Wakes feeling rested.  Health maintenance gaps- closed.  Patient Concerns: None at this time. Follow up with PCP as needed.  Exercise Activities and Dietary recommendations Current Exercise Habits: The patient does not participate in regular exercise at present  Goals    . Increase physical activity     Walk for exercise 3 times  weekly, 30 minutes       Fall Risk Fall Risk  04/23/2017 03/19/2016 05/08/2014 05/05/2014  Falls in the past year? No No No No   Depression Screen PHQ 2/9 Scores 04/23/2017 03/19/2016 05/08/2014 05/05/2014  PHQ - 2 Score 0 0 0 0    Cognitive Function MMSE - Mini Mental State Exam 04/23/2017 03/19/2016  Orientation to time 5 5  Orientation to Place 5 5  Registration 3 3  Attention/ Calculation 5 5  Recall 3 3  Language- name 2 objects 2 2  Language- repeat 1 1  Language- follow 3 step command 3 3  Language- read & follow direction 1 1  Write a sentence 1 1  Copy design 1 1  Total score 30 30        Immunization History  Administered Date(s) Administered  . Influenza, High Dose Seasonal PF 10/29/2012, 12/02/2015, 11/28/2016  . Influenza,inj,Quad PF,6+ Mos 11/03/2013  . Influenza-Unspecified 10/28/2014  . Pneumococcal Conjugate-13 10/29/2012  . Pneumococcal Polysaccharide-23 05/05/2014  . Tdap 05/01/2013  . Zoster 04/16/2006   Screening Tests Health Maintenance  Topic Date Due  . INFLUENZA VACCINE  08/15/2017  . MAMMOGRAM  10/30/2018  . COLONOSCOPY  10/21/2022  . TETANUS/TDAP  05/02/2023  . DEXA SCAN  Completed  . Hepatitis C Screening  Completed  . PNA vac Low Risk Adult  Completed       Plan:    End of life planning; Advance aging; Advanced directives discussed. Copy of current HCPOA/Living Will requested.    I have personally reviewed and noted the following in the patient's chart:   . Medical and social history . Use of alcohol, tobacco or illicit drugs  . Current medications and supplements . Functional ability and status . Nutritional status . Physical activity . Advanced directives . List of other physicians .  Hospitalizations, surgeries, and ER visits in previous 12 months . Vitals . Screenings to include cognitive, depression, and falls . Referrals and appointments  In addition, I have reviewed and discussed with patient certain preventive protocols,  quality metrics, and best practice recommendations. A written personalized care plan for preventive services as well as general preventive health recommendations were provided to patient.     Varney Biles, LPN  07/20/3941   Reviewed above information.  Agree with assessment and plan.    Dr Nicki Reaper

## 2017-04-23 NOTE — Patient Instructions (Addendum)
  Dana Burke , Thank you for taking time to come for your Medicare Wellness Visit. I appreciate your ongoing commitment to your health goals. Please review the following plan we discussed and let me know if I can assist you in the future.   Bring a copy of your [place] and/or Living Will to be scanned into chart.  Have a great day!  These are the goals we discussed: Goals    . Increase physical activity     Walk for exercise 3 times weekly, 30 minutes       This is a list of the screening recommended for you and due dates:  Health Maintenance  Topic Date Due  . Flu Shot  08/15/2017  . Mammogram  10/30/2018  . Colon Cancer Screening  10/21/2022  . Tetanus Vaccine  05/02/2023  . DEXA scan (bone density measurement)  Completed  .  Hepatitis C: One time screening is recommended by Center for Disease Control  (CDC) for  adults born from [birth year] through 1967.   Completed  . Pneumonia vaccines  Completed

## 2017-05-09 ENCOUNTER — Other Ambulatory Visit: Payer: Self-pay | Admitting: Internal Medicine

## 2017-05-13 DIAGNOSIS — R062 Wheezing: Secondary | ICD-10-CM | POA: Diagnosis not present

## 2017-05-13 DIAGNOSIS — J40 Bronchitis, not specified as acute or chronic: Secondary | ICD-10-CM | POA: Diagnosis not present

## 2017-05-13 DIAGNOSIS — R05 Cough: Secondary | ICD-10-CM | POA: Diagnosis not present

## 2017-05-13 DIAGNOSIS — R6889 Other general symptoms and signs: Secondary | ICD-10-CM | POA: Diagnosis not present

## 2017-05-13 DIAGNOSIS — J0100 Acute maxillary sinusitis, unspecified: Secondary | ICD-10-CM | POA: Diagnosis not present

## 2017-06-27 ENCOUNTER — Other Ambulatory Visit: Payer: Self-pay | Admitting: Internal Medicine

## 2017-07-11 ENCOUNTER — Other Ambulatory Visit: Payer: Self-pay | Admitting: Internal Medicine

## 2017-08-12 ENCOUNTER — Encounter: Payer: Self-pay | Admitting: Internal Medicine

## 2017-08-12 ENCOUNTER — Ambulatory Visit (INDEPENDENT_AMBULATORY_CARE_PROVIDER_SITE_OTHER): Payer: Medicare Other | Admitting: Internal Medicine

## 2017-08-12 VITALS — BP 108/74 | HR 73 | Temp 98.1°F | Resp 16 | Ht 69.0 in | Wt 245.8 lb

## 2017-08-12 DIAGNOSIS — E559 Vitamin D deficiency, unspecified: Secondary | ICD-10-CM

## 2017-08-12 DIAGNOSIS — Z1231 Encounter for screening mammogram for malignant neoplasm of breast: Secondary | ICD-10-CM

## 2017-08-12 DIAGNOSIS — E6609 Other obesity due to excess calories: Secondary | ICD-10-CM

## 2017-08-12 DIAGNOSIS — E7800 Pure hypercholesterolemia, unspecified: Secondary | ICD-10-CM | POA: Diagnosis not present

## 2017-08-12 DIAGNOSIS — R7303 Prediabetes: Secondary | ICD-10-CM | POA: Diagnosis not present

## 2017-08-12 DIAGNOSIS — Z6836 Body mass index (BMI) 36.0-36.9, adult: Secondary | ICD-10-CM | POA: Diagnosis not present

## 2017-08-12 DIAGNOSIS — F321 Major depressive disorder, single episode, moderate: Secondary | ICD-10-CM | POA: Diagnosis not present

## 2017-08-12 DIAGNOSIS — Z1239 Encounter for other screening for malignant neoplasm of breast: Secondary | ICD-10-CM

## 2017-08-12 DIAGNOSIS — G629 Polyneuropathy, unspecified: Secondary | ICD-10-CM | POA: Diagnosis not present

## 2017-08-12 DIAGNOSIS — Z79899 Other long term (current) drug therapy: Secondary | ICD-10-CM | POA: Diagnosis not present

## 2017-08-12 LAB — COMPREHENSIVE METABOLIC PANEL
ALBUMIN: 4.1 g/dL (ref 3.5–5.2)
ALK PHOS: 73 U/L (ref 39–117)
ALT: 19 U/L (ref 0–35)
AST: 16 U/L (ref 0–37)
BILIRUBIN TOTAL: 0.5 mg/dL (ref 0.2–1.2)
BUN: 14 mg/dL (ref 6–23)
CALCIUM: 10.3 mg/dL (ref 8.4–10.5)
CO2: 30 meq/L (ref 19–32)
Chloride: 107 mEq/L (ref 96–112)
Creatinine, Ser: 0.93 mg/dL (ref 0.40–1.20)
GFR: 62.58 mL/min (ref >60.00)
Glucose, Bld: 101 mg/dL — ABNORMAL HIGH (ref 70–99)
Potassium: 4.6 mEq/L (ref 3.5–5.1)
Sodium: 142 mEq/L (ref 135–145)
Total Protein: 7 g/dL (ref 6.0–8.3)

## 2017-08-12 LAB — LIPID PANEL
CHOLESTEROL: 149 mg/dL (ref 0–200)
HDL: 35.8 mg/dL — AB (ref >39.00)
LDL Cholesterol: 90 mg/dL (ref 0–99)
NonHDL: 112.96
TRIGLYCERIDES: 117 mg/dL (ref 0.0–149.0)
Total CHOL/HDL Ratio: 4
VLDL: 23.4 mg/dL (ref 0.0–40.0)

## 2017-08-12 LAB — HEMOGLOBIN A1C: Hgb A1c MFr Bld: 5.9 % (ref 4.6–6.5)

## 2017-08-12 LAB — VITAMIN D 25 HYDROXY (VIT D DEFICIENCY, FRACTURES): VITD: 26.18 ng/mL — ABNORMAL LOW (ref 30.00–100.00)

## 2017-08-12 NOTE — Assessment & Plan Note (Signed)
Exam was normal,  screening for thyroid, B12 deficiencies,  Syphilis and DM  negative. Symptoms controlled with increased dose of gabapentin to 600 mg at bedtime

## 2017-08-12 NOTE — Patient Instructions (Addendum)
I'm glad to see you are doing so well!  No medication changes today  If the left arm pain becomes associated with nausea,  Shortness of breath,  Sweating or chest pain, let me know immediately or go to ER   I am checking cholesterol and screening for diabetes and vitamin d deficiency as well today   Your mammogram is due in October .  I will order it you need to call [place] to set it up

## 2017-08-12 NOTE — Progress Notes (Signed)
Subjective:  Patient ID: Dana Burke, female    DOB: 1945/11/11  Age: 72 y.o. MRN: [id number]  CC: The primary encounter diagnosis was Pure hypercholesterolemia. Diagnoses of Vitamin D deficiency, Long-term use of high-risk medication, Prediabetes, Breast cancer screening, Neuropathy, Class 2 obesity due to excess calories without serious comorbidity with body mass index (BMI) of 36.0 to 36.9 in adult, and Current moderate episode of major depressive disorder without prior episode [facility]) were also pertinent to this visit.  HPI Dana Burke presents for follow up on MDD mamanaged with wellbutrin , hyperlipidemia  Feeling much better .  Tolerating wellbutrin .  No longer crying daily.  increased socialization.   Has acquired a female companion, a fellow widower,  Met him at her 94 yr high school school reunion. Platonic.   Left arm pain recurrent  For the past 2 weeks  In the biceps and forearm.  Has occurred at rest 3 or 4 times,  No diaphoresis  Or chest pain.  does needle point, for several hours per night     Treated in April for bronchitis by Jefm Bryant  In with abx,  Steroids, and MDI .  Chest x ray done     Outpatient Medications Prior to Visit  Medication Sig Dispense Refill  . buPROPion (WELLBUTRIN XL) 300 MG 24 hr tablet Take 1 tablet (300 mg total) by mouth daily. 90 tablet 1  . calcium carbonate (OS-CAL) 600 MG TABS Take 600 mg by mouth 2 (two) times daily with a meal.    . cetirizine (ZYRTEC) 10 MG tablet Take 10 mg by mouth at bedtime.     . Cholecalciferol (VITAMIN D-3) 1000 UNITS CAPS Take 1 capsule by mouth daily.    [name] Sodium (COLACE PO) Take 1 capsule by mouth daily.    [name] escitalopram (LEXAPRO) 10 MG tablet TAKE 1 TABLET BY MOUTH ONCE DAILY 90 tablet 1  . gabapentin (NEURONTIN) 300 MG capsule TAKE 1 CAPSULE BY MOUTH 4 TIMES DAILY 120 capsule 1  . Melatonin 1 MG TABS Take 1 tablet by mouth.    . Multiple Vitamins-Minerals (MULTIVITAMIN WITH MINERALS) tablet Take 1  tablet by mouth daily.    [name] omeprazole (PRILOSEC) 20 MG capsule Take 20 mg by mouth daily.    . simvastatin (ZOCOR) 20 MG tablet TAKE 1 TABLET BY MOUTH AT BEDTIME. 90 tablet 0   No facility-administered medications prior to visit.     Review of Systems;  Patient denies headache, fevers, malaise, unintentional weight loss, skin rash, eye pain, sinus congestion and sinus pain, sore throat, dysphagia,  hemoptysis , cough, dyspnea, wheezing, chest pain, palpitations, orthopnea, edema, abdominal pain, nausea, melena, diarrhea, constipation, flank pain, dysuria, hematuria, urinary  Frequency, nocturia, numbness, tingling, seizures,  Focal weakness, Loss of consciousness,  Tremor, insomnia, depression, anxiety, and suicidal ideation.      Objective:  BP 108/74 (BP Location: Left Arm, Patient Position: Sitting, Cuff Size: Large)   Pulse 73   Temp 98.1 F (36.7 C) (Oral)   Resp 16   Ht '5\' 9"'  (1.753 m)   Wt 245 lb 12.8 oz (111.5 kg)   SpO2 94%   BMI 36.30 kg/m   BP Readings from Last 3 Encounters:  08/12/17 108/74  04/23/17 110/64  02/11/17 122/84    Wt Readings from Last 3 Encounters:  08/12/17 245 lb 12.8 oz (111.5 kg)  04/23/17 246 lb (111.6 kg)  02/11/17 245 lb (111.1 kg)    General appearance: alert, cooperative and  appears stated age Ears: normal TM's and external ear canals both ears Throat: lips, mucosa, and tongue normal; teeth and gums normal Neck: no adenopathy, no carotid bruit, supple, symmetrical, trachea midline and thyroid not enlarged, symmetric, no tenderness/mass/nodules Back: symmetric, no curvature. ROM normal. No CVA tenderness. Lungs: clear to auscultation bilaterally Heart: regular rate and rhythm, S1, S2 normal, no murmur, click, rub or gallop Abdomen: soft, non-tender; bowel sounds normal; no masses,  no organomegaly Pulses: 2+ and symmetric Skin: Skin color, texture, turgor normal. No rashes or lesions MSK: pain in left arm reproduced with forced  abduction .  No thenar wasting  Lymph nodes: Cervical, supraclavicular, and axillary nodes normal.  Lab Results  Component Value Date   HGBA1C 5.8 12/02/2015   HGBA1C 5.9 05/31/2014    Lab Results  Component Value Date   CREATININE 0.89 03/08/2017   CREATININE 0.94 02/11/2017   CREATININE 0.88 12/02/2015    Lab Results  Component Value Date   WBC 9.6 12/02/2015   HGB 14.1 12/02/2015   HCT 42.1 12/02/2015   PLT 222.0 12/02/2015   GLUCOSE 82 03/08/2017   CHOL 183 02/11/2017   TRIG 111.0 02/11/2017   HDL 32.00 (L) 02/11/2017   LDLDIRECT 160.0 12/02/2015   LDLCALC 129 (H) 02/11/2017   ALT 16 03/08/2017   AST 14 03/08/2017   NA 139 03/08/2017   K 4.4 03/08/2017   CL 104 03/08/2017   CREATININE 0.89 03/08/2017   BUN 18 03/08/2017   CO2 30 03/08/2017   TSH 0.91 02/11/2017   HGBA1C 5.8 12/02/2015    Mm Diag Breast Tomo Bilateral  Result Date: 10/29/2016 CLINICAL DATA:  72 year old female with strong family history of breast cancer presenting for follow-up status post left breast biopsy in October 2017 very EXAM: 2D DIGITAL DIAGNOSTIC BILATERAL MAMMOGRAM WITH CAD AND ADJUNCT TOMO COMPARISON:  Previous exam(s). ACR Breast Density Category b: There are scattered areas of fibroglandular density. FINDINGS: There are no suspicious changes at the site of the biopsy marking clip in the upper-outer left breast. The masses previously seen at this site have resolved. No suspicious calcifications, masses or areas of distortion are seen in the bilateral breasts. Mammographic images were processed with CAD. IMPRESSION: 1. Resolution of the masses at the biopsy site in the upper-outer left breast. 2.  No mammographic evidence of malignancy in the bilateral breasts. RECOMMENDATION: Screening mammogram in one year.(Code:[id number]) I have discussed the findings and recommendations with the patient. Results were also provided in writing at the conclusion of the visit. If applicable, a reminder letter  will be sent to the patient regarding the next appointment. BI-RADS CATEGORY  1: Negative. Electronically Signed   By: Ammie Ferrier M.D.   On: 10/29/2016 13:29    Assessment & Plan:   Problem List Items Addressed This Visit    Obesity    I have addressed  BMI and recommended a low glycemic index diet utilizing smaller more frequent meals to increase metabolism.  I have also recommended that patient start exercising with a goal of 30 minutes of aerobic exercise a minimum of 5 days per week.        Neuropathy    Exam was normal,  screening for thyroid, B12 deficiencies,  Syphilis and DM  negative. Symptoms controlled with increased dose of gabapentin to 600 mg at bedtime        Major depressive disorder, single episode    Symptoms improved with addition of lexapro to Wellbutrin  300 mg daily ,  And she now has a female companion.  No changes today       Hyperlipidemia - Primary    Ten year risk of CAD is 12%,  Statin therapy tolerated and repeat labs due .      Relevant Orders   Lipid panel    Other Visit Diagnoses    Vitamin D deficiency       Relevant Orders   VITAMIN D 25 Hydroxy (Vit-D Deficiency, Fractures)   Long-term use of high-risk medication       Relevant Orders   Comprehensive metabolic panel   Prediabetes       Relevant Orders   Hemoglobin A1c   Breast cancer screening       Relevant Orders   MM 3D SCREEN BREAST BILATERAL    A total of 25 minutes of face to face time was spent with patient more than half of which was spent in counselling about the above mentioned conditions  and coordination of care   I am having Suraiya C. Crate maintain her omeprazole, multivitamin with minerals, calcium carbonate, Vitamin D-3, cetirizine, Docusate Sodium (COLACE PO), buPROPion, simvastatin, Melatonin, gabapentin, and escitalopram.  No orders of the defined types were placed in this encounter.   There are no discontinued medications.  Follow-up: Return in about 6  months (around 02/12/2018).   Crecencio Mc, MD

## 2017-08-12 NOTE — Assessment & Plan Note (Signed)
Symptoms improved with addition of lexapro to Wellbutrin  300 mg daily ,  And she now has a female companion.  No changes today

## 2017-08-12 NOTE — Assessment & Plan Note (Signed)
Ten year risk of CAD is 12%,  Statin therapy tolerated and repeat labs due .

## 2017-08-12 NOTE — Assessment & Plan Note (Signed)
I have addressed  BMI and recommended a low glycemic index diet utilizing smaller more frequent meals to increase metabolism.  I have also recommended that patient start exercising with a goal of 30 minutes of aerobic exercise a minimum of 5 days per week.  

## 2017-08-13 ENCOUNTER — Other Ambulatory Visit: Payer: Self-pay | Admitting: Internal Medicine

## 2017-08-27 ENCOUNTER — Encounter: Payer: Self-pay | Admitting: Family Medicine

## 2017-08-27 ENCOUNTER — Ambulatory Visit (INDEPENDENT_AMBULATORY_CARE_PROVIDER_SITE_OTHER): Payer: Medicare Other | Admitting: Family Medicine

## 2017-08-27 VITALS — BP 116/74 | HR 88 | Temp 98.8°F | Resp 16 | Wt 246.4 lb

## 2017-08-27 DIAGNOSIS — R05 Cough: Secondary | ICD-10-CM

## 2017-08-27 DIAGNOSIS — R059 Cough, unspecified: Secondary | ICD-10-CM

## 2017-08-27 DIAGNOSIS — J40 Bronchitis, not specified as acute or chronic: Secondary | ICD-10-CM | POA: Diagnosis not present

## 2017-08-27 DIAGNOSIS — R0989 Other specified symptoms and signs involving the circulatory and respiratory systems: Secondary | ICD-10-CM

## 2017-08-27 DIAGNOSIS — R062 Wheezing: Secondary | ICD-10-CM

## 2017-08-27 MED ORDER — ALBUTEROL SULFATE (2.5 MG/3ML) 0.083% IN NEBU
2.5000 mg | INHALATION_SOLUTION | Freq: Once | RESPIRATORY_TRACT | Status: AC
  Administered 2017-08-27: 2.5 mg via RESPIRATORY_TRACT

## 2017-08-27 MED ORDER — PREDNISONE 10 MG PO TABS: 10.0000 mg | ORAL_TABLET | Freq: Every day | ORAL | 0 refills | Status: AC

## 2017-08-27 MED ORDER — IPRATROPIUM BROMIDE 0.02 % IN SOLN
0.5000 mg | Freq: Once | RESPIRATORY_TRACT | Status: AC
  Administered 2017-08-27: 0.5 mg via RESPIRATORY_TRACT

## 2017-08-27 MED ORDER — AZITHROMYCIN 250 MG PO TABS: ORAL_TABLET | ORAL | 0 refills | Status: DC

## 2017-08-27 MED ORDER — BENZONATATE 100 MG PO CAPS: 100.0000 mg | ORAL_CAPSULE | Freq: Three times a day (TID) | ORAL | 1 refills | Status: DC | PRN

## 2017-08-27 NOTE — Patient Instructions (Signed)
Great to meet you!  Use inhaler you have at home as needed  Prednisone course, zpak and tessalon perles as needed for cough.

## 2017-08-27 NOTE — Progress Notes (Signed)
   Subjective:    Patient ID: Dana Burke, female    DOB: 11-08-1945, 72 y.o.   MRN: [id number]  HPI  Presents to clinic with deep, harsh cough, congestion, wheeze for past 3-4 days. She had episode similar to this back in April and has been using albuterol inhaler with some relief. Sputum from cough is described as a "golden" color. Denies fever or chills.   Patient Active Problem List   Diagnosis Date Noted  . Encounter for preventive health examination 12/04/2015  . Insomnia 12/04/2015  . Neuropathy 06/02/2014  . Major depressive disorder, single episode 05/08/2014  . Screening for osteoporosis 05/08/2014  . History of DVT of lower extremity 05/05/2014  . Hyperlipidemia 05/05/2013  . Medicare annual wellness visit, subsequent 05/03/2013  . Special screening for malignant neoplasms, colon 09/04/2012  . Obesity 07/31/2012  . History of abnormal mammogram 07/31/2012  . Family history of malignant melanoma 07/31/2012  . GERD (gastroesophageal reflux disease)    Social History   Tobacco Use  . Smoking status: Former Smoker    Packs/day: 0.40    Types: Cigarettes    Last attempt to quit: 07/30/1987    Years since quitting: 30.0  . Smokeless tobacco: Never Used  Substance Use Topics  . Alcohol use: No   Review of Systems  Constitutional: Negative for chills, fatigue and fever.  HENT: Postive for congestion, sinus pain/pressure..   Eyes: Negative.   Respiratory: Positive for productive cough, SOB and wheezing.  Cardiovascular: Negative for chest pain, palpitations and leg swelling.  Gastrointestinal: Negative for abdominal pain, diarrhea, nausea and vomiting.  Genitourinary: Negative for dysuria, frequency and urgency.  Musculoskeletal: Negative for arthralgias and myalgias.  Skin: Negative for color change, pallor and rash.  Neurological: Negative for syncope, light-headedness and headaches.  Psychiatric/Behavioral: The patient is not nervous/anxious.    Objective:   Physical Exam  Constitutional: She appears well-developed and well-nourished. No distress.  Head: Normocephalic and atraumatic Ear: Fullness bilat TMs Nose/Throat: mild nasal discharge Eyes: Pupils are equal, round, and reactive to light. Conjunctivae and EOM are normal. No scleral icterus.  Neck: Normal range of motion. Neck supple. No tracheal deviation present.  Cardiovascular: Normal rate, regular rhythm and normal heart sounds.  Pulmonary/Chest: Effort normal. Diffuse rhonchi throughout lung fields and expiratory wheezing bilateral upper lobes.  Breathing improved after neb treatment.  Neurological: Is alert and oriented to person, place, and time.  Gait normal  Skin: Skin is warm and dry, not diaphoretic. No pallor.  Psychiatric: She has a normal mood and affect.   Nursing note and vitals reviewed.  Vitals:   08/27/17 1450  BP: 116/74  Pulse: 88  Resp: 16  Temp: 98.8 F (37.1 C)  SpO2: 94%      Assessment & Plan:    Bronchitis -- Prednisone burst. Zpak due to productive cough and diffuse rhonchi. Increase fluids, rest, do good handwashing.  Cough - Tessalon perles as needed for cough  Wheezing - DuoNeb treatment x1 in clinic. Use inhaler at home as needed for SOB or Wheeze.    Keep follow up as already scheduled - you may return sooner if symptoms persist or worsen and anytime as needed.

## 2017-09-06 ENCOUNTER — Other Ambulatory Visit: Payer: Self-pay | Admitting: Internal Medicine

## 2017-10-15 ENCOUNTER — Other Ambulatory Visit: Payer: Self-pay | Admitting: Internal Medicine

## 2017-10-15 DIAGNOSIS — H524 Presbyopia: Secondary | ICD-10-CM | POA: Diagnosis not present

## 2017-10-15 DIAGNOSIS — H43393 Other vitreous opacities, bilateral: Secondary | ICD-10-CM | POA: Diagnosis not present

## 2017-10-15 DIAGNOSIS — H02834 Dermatochalasis of left upper eyelid: Secondary | ICD-10-CM | POA: Diagnosis not present

## 2017-10-15 DIAGNOSIS — H02831 Dermatochalasis of right upper eyelid: Secondary | ICD-10-CM | POA: Diagnosis not present

## 2017-10-25 ENCOUNTER — Other Ambulatory Visit: Payer: Self-pay | Admitting: Internal Medicine

## 2017-11-04 DIAGNOSIS — Z23 Encounter for immunization: Secondary | ICD-10-CM | POA: Diagnosis not present

## 2017-11-06 ENCOUNTER — Other Ambulatory Visit: Payer: Self-pay | Admitting: Internal Medicine

## 2017-11-18 ENCOUNTER — Ambulatory Visit: Payer: Medicare Other

## 2017-11-19 DIAGNOSIS — L905 Scar conditions and fibrosis of skin: Secondary | ICD-10-CM | POA: Diagnosis not present

## 2017-11-19 DIAGNOSIS — L578 Other skin changes due to chronic exposure to nonionizing radiation: Secondary | ICD-10-CM | POA: Diagnosis not present

## 2017-11-19 DIAGNOSIS — D485 Neoplasm of uncertain behavior of skin: Secondary | ICD-10-CM | POA: Diagnosis not present

## 2017-11-19 DIAGNOSIS — L720 Epidermal cyst: Secondary | ICD-10-CM | POA: Diagnosis not present

## 2017-11-19 DIAGNOSIS — D1800 Hemangioma unspecified site: Secondary | ICD-10-CM | POA: Diagnosis not present

## 2017-11-19 DIAGNOSIS — L820 Inflamed seborrheic keratosis: Secondary | ICD-10-CM | POA: Diagnosis not present

## 2017-11-19 DIAGNOSIS — L812 Freckles: Secondary | ICD-10-CM | POA: Diagnosis not present

## 2017-11-19 DIAGNOSIS — L821 Other seborrheic keratosis: Secondary | ICD-10-CM | POA: Diagnosis not present

## 2017-11-19 DIAGNOSIS — Z1283 Encounter for screening for malignant neoplasm of skin: Secondary | ICD-10-CM | POA: Diagnosis not present

## 2017-11-19 DIAGNOSIS — D2239 Melanocytic nevi of other parts of face: Secondary | ICD-10-CM | POA: Diagnosis not present

## 2017-11-19 DIAGNOSIS — B353 Tinea pedis: Secondary | ICD-10-CM | POA: Diagnosis not present

## 2017-11-19 DIAGNOSIS — L918 Other hypertrophic disorders of the skin: Secondary | ICD-10-CM | POA: Diagnosis not present

## 2017-11-20 ENCOUNTER — Ambulatory Visit
Admission: RE | Admit: 2017-11-20 | Discharge: 2017-11-20 | Disposition: A | Discharge status: Home / Self Care | Payer: Medicare Other | Source: Ambulatory Visit | Attending: Internal Medicine | Admitting: Internal Medicine

## 2017-11-20 DIAGNOSIS — Z1231 Encounter for screening mammogram for malignant neoplasm of breast: Secondary | ICD-10-CM | POA: Diagnosis not present

## 2017-11-20 DIAGNOSIS — Z1239 Encounter for other screening for malignant neoplasm of breast: Secondary | ICD-10-CM

## 2017-12-02 DIAGNOSIS — D2239 Melanocytic nevi of other parts of face: Secondary | ICD-10-CM | POA: Diagnosis not present

## 2017-12-02 DIAGNOSIS — L821 Other seborrheic keratosis: Secondary | ICD-10-CM | POA: Diagnosis not present

## 2017-12-02 DIAGNOSIS — L820 Inflamed seborrheic keratosis: Secondary | ICD-10-CM | POA: Diagnosis not present

## 2017-12-08 DIAGNOSIS — H6691 Otitis media, unspecified, right ear: Secondary | ICD-10-CM | POA: Diagnosis not present

## 2017-12-08 DIAGNOSIS — J40 Bronchitis, not specified as acute or chronic: Secondary | ICD-10-CM | POA: Diagnosis not present

## 2017-12-08 DIAGNOSIS — H7291 Unspecified perforation of tympanic membrane, right ear: Secondary | ICD-10-CM | POA: Diagnosis not present

## 2017-12-18 DIAGNOSIS — H6501 Acute serous otitis media, right ear: Secondary | ICD-10-CM | POA: Diagnosis not present

## 2017-12-18 DIAGNOSIS — H6981 Other specified disorders of Eustachian tube, right ear: Secondary | ICD-10-CM | POA: Diagnosis not present

## 2018-01-03 ENCOUNTER — Other Ambulatory Visit: Payer: Self-pay | Admitting: Internal Medicine

## 2018-01-17 DIAGNOSIS — H6980 Other specified disorders of Eustachian tube, unspecified ear: Secondary | ICD-10-CM | POA: Diagnosis not present

## 2018-01-17 DIAGNOSIS — H6500 Acute serous otitis media, unspecified ear: Secondary | ICD-10-CM | POA: Diagnosis not present

## 2018-01-31 ENCOUNTER — Other Ambulatory Visit: Payer: Self-pay | Admitting: Internal Medicine

## 2018-02-12 ENCOUNTER — Ambulatory Visit (INDEPENDENT_AMBULATORY_CARE_PROVIDER_SITE_OTHER): Payer: Medicare Other | Admitting: Internal Medicine

## 2018-02-12 ENCOUNTER — Encounter: Payer: Self-pay | Admitting: Internal Medicine

## 2018-02-12 VITALS — BP 110/78 | HR 78 | Temp 98.0°F | Resp 15 | Ht 69.0 in | Wt 249.0 lb

## 2018-02-12 DIAGNOSIS — E6609 Other obesity due to excess calories: Secondary | ICD-10-CM

## 2018-02-12 DIAGNOSIS — R7303 Prediabetes: Secondary | ICD-10-CM

## 2018-02-12 DIAGNOSIS — H7292 Unspecified perforation of tympanic membrane, left ear: Secondary | ICD-10-CM

## 2018-02-12 DIAGNOSIS — H6692 Otitis media, unspecified, left ear: Secondary | ICD-10-CM | POA: Diagnosis not present

## 2018-02-12 DIAGNOSIS — Z6836 Body mass index (BMI) 36.0-36.9, adult: Secondary | ICD-10-CM | POA: Diagnosis not present

## 2018-02-12 DIAGNOSIS — G4700 Insomnia, unspecified: Secondary | ICD-10-CM

## 2018-02-12 LAB — COMPREHENSIVE METABOLIC PANEL
ALBUMIN: 4.2 g/dL (ref 3.5–5.2)
ALT: 16 U/L (ref 0–35)
AST: 13 U/L (ref 0–37)
Alkaline Phosphatase: 84 U/L (ref 39–117)
BILIRUBIN TOTAL: 0.4 mg/dL (ref 0.2–1.2)
BUN: 14 mg/dL (ref 6–23)
CALCIUM: 10.3 mg/dL (ref 8.4–10.5)
CO2: 31 mEq/L (ref 19–32)
CREATININE: 0.78 mg/dL (ref 0.40–1.20)
Chloride: 106 mEq/L (ref 96–112)
GFR: 72.03 mL/min (ref >60.00)
Glucose, Bld: 93 mg/dL (ref 70–99)
Potassium: 4.5 mEq/L (ref 3.5–5.1)
SODIUM: 140 meq/L (ref 135–145)
TOTAL PROTEIN: 6.7 g/dL (ref 6.0–8.3)

## 2018-02-12 LAB — HEMOGLOBIN A1C: Hgb A1c MFr Bld: 5.9 % (ref 4.6–6.5)

## 2018-02-12 MED ORDER — SIMVASTATIN 20 MG PO TABS: 20.0000 mg | ORAL_TABLET | Freq: Every day | ORAL | 2 refills | Status: DC

## 2018-02-12 MED ORDER — BUPROPION HCL ER (XL) 300 MG PO TB24: 300.0000 mg | ORAL_TABLET | Freq: Every day | ORAL | 2 refills | Status: DC

## 2018-02-12 NOTE — Patient Instructions (Addendum)
Your eardrum is healing.  If you are still experiencing a feeling of pressure in your head or sinuses,  Start using sudafed or sudafed PE   When you develop sinus congestion and cough:  Start using Sudafed PE 10 or 20 mg every 6 to 8 hours use Afrin at night instead of the evening dose   Call for an appt if symptoms progress after a few days   Ask DR Richardson Landry about using DR Milta Deiters Meds sinus rinse as a preventive  measure      Exercise for 30 minutes 5 days per week  Preventing Type 2 Diabetes Mellitus Type 2 diabetes (type 2 diabetes mellitus) is a long-term (chronic) disease that affects blood sugar (glucose) levels. Normally, a hormone called insulin allows glucose to enter cells in the body. The cells use glucose for energy. In type 2 diabetes, one or both of these problems may be present:  The body does not make enough insulin.  The body does not respond properly to insulin that it makes (insulin resistance). Insulin resistance or lack of insulin causes excess glucose to build up in the blood instead of going into cells. As a result, high blood glucose (hyperglycemia) develops, which can cause many complications. Being overweight or obese and having an inactive (sedentary) lifestyle can increase your risk for diabetes. Type 2 diabetes can be delayed or prevented by making certain nutrition and lifestyle changes. What nutrition changes can be made?   Eat healthy meals and snacks regularly. Keep a healthy snack with you for when you get hungry between meals, such as fruit or a handful of nuts.  Eat lean meats and proteins that are low in saturated fats, such as chicken, fish, egg whites, and beans. Avoid processed meats.  Eat plenty of fruits and vegetables and plenty of grains that have not been processed (whole grains). It is recommended that you eat: ? 1?2 cups of fruit every day. ? 2?3 cups of vegetables every day. ? 6?8 oz of whole grains every day, such as oats, whole wheat,  bulgur, brown rice, quinoa, and millet.  Eat low-fat dairy products, such as milk, yogurt, and cheese.  Eat foods that contain healthy fats, such as nuts, avocado, olive oil, and canola oil.  Drink water throughout the day. Avoid drinks that contain added sugar, such as soda or sweet tea.  Follow instructions from your health care provider about specific eating or drinking restrictions.  Control how much food you eat at a time (portion size). ? Check food labels to find out the serving sizes of foods. ? Use a kitchen scale to weigh amounts of foods.  Saute or steam food instead of frying it. Cook with water or broth instead of oils or butter.  Limit your intake of: ? Salt (sodium). Have no more than 1 tsp (2,400 mg) of sodium a day. If you have heart disease or high blood pressure, have less than ? tsp (1,500 mg) of sodium a day. ? Saturated fat. This is fat that is solid at room temperature, such as butter or fat on meat. What lifestyle changes can be made? Activity   Do moderate-intensity physical activity for at least 30 minutes on at least 5 days of the week, or as much as told by your health care provider.  Ask your health care provider what activities are safe for you. A mix of physical activities may be best, such as walking, swimming, cycling, and strength training.  Try to add physical activity  into your day. For example: ? Park in spots that are farther away than usual, so that you walk more. For example, park in a far corner of the parking lot when you go to the office or the grocery store. ? Take a walk during your lunch break. ? Use stairs instead of elevators or escalators. Weight Loss  Lose weight as directed. Your health care provider can determine how much weight loss is best for you and can help you lose weight safely.  If you are overweight or obese, you may be instructed to lose at least 5?7 % of your body weight. Alcohol and Tobacco   Limit alcohol intake  to no more than 1 drink a day for nonpregnant women and 2 drinks a day for men. One drink equals 12 oz of beer, 5 oz of wine, or 1 oz of hard liquor.  Do not use any tobacco products, such as cigarettes, chewing tobacco, and e-cigarettes. If you need help quitting, ask your health care provider. Work With [place] Provider  Have your blood glucose tested regularly, as told by your health care provider.  Discuss your risk factors and how you can reduce your risk for diabetes.  Get screening tests as told by your health care provider. You may have screening tests regularly, especially if you have certain risk factors for type 2 diabetes.  Make an appointment with a diet and nutrition specialist (registered dietitian). A registered dietitian can help you make a healthy eating plan and can help you understand portion sizes and food labels. Why are these changes important?  It is possible to prevent or delay type 2 diabetes and related health problems by making lifestyle and nutrition changes.  It can be difficult to recognize signs of type 2 diabetes. The best way to avoid possible damage to your body is to take actions to prevent the disease before you develop symptoms. What can happen if changes are not made?  Your blood glucose levels may keep increasing. Having high blood glucose for a long time is dangerous. Too much glucose in your blood can damage your blood vessels, heart, kidneys, nerves, and eyes.  You may develop prediabetes or type 2 diabetes. Type 2 diabetes can lead to many chronic health problems and complications, such as: ? Heart disease. ? Stroke. ? Blindness. ? Kidney disease. ? Depression. ? Poor circulation in the feet and legs, which could lead to surgical removal (amputation) in severe cases. Where to find support  Ask your health care provider to recommend a registered dietitian, diabetes educator, or weight loss program.  Look for local or online weight  loss groups.  Join a gym, fitness club, or outdoor activity group, such as a walking club. Where to find more information To learn more about diabetes and diabetes prevention, visit:  American Diabetes Association (ADA): www.diabetes.[employer] of Diabetes and Digestive and Kidney Diseases: FindSpin.nl To learn more about healthy eating, visit:  The U.S. Department of Agriculture Scientist, research (physical sciences)), Choose My Plate: http://wiley-williams.com/  Office of Disease Prevention and Health Promotion (ODPHP), Dietary Guidelines: SurferLive.at Summary  You can reduce your risk for type 2 diabetes by increasing your physical activity, eating healthy foods, and losing weight as directed.  Talk with your health care provider about your risk for type 2 diabetes. Ask about any blood tests or screening tests that you need to have. This information is not intended to replace advice given to you by your health care provider. Make sure  you discuss any questions you have with your health care provider. Document Released: 04/25/2015 Document Revised: 12/13/2016 Document Reviewed: 02/22/2015 Elsevier Interactive Patient Education  2019 [employer].

## 2018-02-12 NOTE — Progress Notes (Signed)
Subjective:  Patient ID: Dana Burke, female    DOB: 12/05/1945  Age: 73 y.o. MRN: [id number]  CC: The primary encounter diagnosis was Prediabetes. Diagnoses of Otitis media with rupture of tympanic membrane, left, Class 2 obesity due to excess calories without serious comorbidity with body mass index (BMI) of 36.0 to 36.9 in adult, and Insomnia, unspecified type were also pertinent to this visit.  HPI Dana Burke presents for FOLLOW UP on several chronic conditions   ACUTE BRONCHITIS , In November  COMPLICATED BY SINUSITIS AND OTITIS MEDIA WITH TM RUPTURE  On the Right .  Hx:  Initially treated by Urgent care with pred/doxy,  Then saw ENT Bennett.who repeated pred/doxy regimen.  Her hearing is finally  coming back.   Did not have to have myringotomy tube placed.   No recent travel.  Feeling good .  Not walking due to the cold .    Mood improved, tolerating bupropion  Spent time with extended family over the holidays .  Sleeping better   Outpatient Medications Prior to Visit  Medication Sig Dispense Refill  . benzonatate (TESSALON) 100 MG capsule Take 1 capsule (100 mg total) by mouth 3 (three) times daily as needed for cough. 30 capsule 1  . calcium carbonate (OS-CAL) 600 MG TABS Take 600 mg by mouth 2 (two) times daily with a meal.    . cetirizine (ZYRTEC) 10 MG tablet Take 10 mg by mouth at bedtime.     . Cholecalciferol (VITAMIN D-3) 1000 UNITS CAPS Take 1 capsule by mouth daily.    [name] Sodium (COLACE PO) Take 1 capsule by mouth daily.    [name] escitalopram (LEXAPRO) 10 MG tablet TAKE 1 TABLET BY MOUTH ONCE DAILY 90 tablet 1  . gabapentin (NEURONTIN) 300 MG capsule TAKE 1 CAPSULE BY MOUTH 4 TIMES DAILY (Patient taking differently: Take 300 mg by mouth 5 (five) times daily. ) 120 capsule 2  . Melatonin 1 MG TABS Take 1 tablet by mouth.    . Multiple Vitamins-Minerals (MULTIVITAMIN WITH MINERALS) tablet Take 1 tablet by mouth daily.    [name] omeprazole (PRILOSEC) 20 MG  capsule Take 20 mg by mouth daily.    [name] buPROPion (WELLBUTRIN XL) 300 MG 24 hr tablet TAKE 1 TABLET BY MOUTH ONCE DAILY 90 tablet 1  . simvastatin (ZOCOR) 20 MG tablet TAKE 1 TABLET BY MOUTH AT BEDTIME 90 tablet 1  . azithromycin (ZITHROMAX) 250 MG tablet Take 2 tablets (500mg ) by mouth on day 1, take 1 tablet (250mg ) by mouth days 2-5 (Patient not taking: Reported on 02/12/2018) 6 tablet 0  . buPROPion (WELLBUTRIN XL) 300 MG 24 hr tablet Take 1 tablet (300 mg total) by mouth daily. (Patient not taking: Reported on 02/12/2018) 90 tablet 1   No facility-administered medications prior to visit.     Review of Systems;  Patient denies headache, fevers, malaise, unintentional weight loss, skin rash, eye pain, sinus congestion and sinus pain, sore throat, dysphagia,  hemoptysis , cough, dyspnea, wheezing, chest pain, palpitations, orthopnea, edema, abdominal pain, nausea, melena, diarrhea, constipation, flank pain, dysuria, hematuria, urinary  Frequency, nocturia, numbness, tingling, seizures,  Focal weakness, Loss of consciousness,  Tremor, insomnia, depression, anxiety, and suicidal ideation.      Objective:  BP 110/78 (BP Location: Left Arm, Patient Position: Sitting, Cuff Size: Large)   Pulse 78   Temp 98 F (36.7 C) (Oral)   Resp 15   Ht 5\' 9"  (1.753 m)   Wt 249 lb (  112.9 kg)   SpO2 95%   BMI 36.77 kg/m   BP Readings from Last 3 Encounters:  02/12/18 110/78  08/27/17 116/74  08/12/17 108/74    Wt Readings from Last 3 Encounters:  02/12/18 249 lb (112.9 kg)  08/27/17 246 lb 6 oz (111.8 kg)  08/12/17 245 lb 12.8 oz (111.5 kg)    General appearance: alert, cooperative and appears stated age Ears: normal TM's and external ear canals both ears Throat: lips, mucosa, and tongue normal; teeth and gums normal Neck: no adenopathy, no carotid bruit, supple, symmetrical, trachea midline and thyroid not enlarged, symmetric, no tenderness/mass/nodules Back: symmetric, no curvature. ROM  normal. No CVA tenderness. Lungs: clear to auscultation bilaterally Heart: regular rate and rhythm, S1, S2 normal, no murmur, click, rub or gallop Abdomen: soft, non-tender; bowel sounds normal; no masses,  no organomegaly Pulses: 2+ and symmetric Skin: Skin color, texture, turgor normal. No rashes or lesions Lymph nodes: Cervical, supraclavicular, and axillary nodes normal.  Lab Results  Component Value Date   HGBA1C 5.9 02/12/2018   HGBA1C 5.9 08/12/2017   HGBA1C 5.8 12/02/2015    Lab Results  Component Value Date   CREATININE 0.78 02/12/2018   CREATININE 0.93 08/12/2017   CREATININE 0.89 03/08/2017    Lab Results  Component Value Date   WBC 9.6 12/02/2015   HGB 14.1 12/02/2015   HCT 42.1 12/02/2015   PLT 222.0 12/02/2015   GLUCOSE 93 02/12/2018   CHOL 149 08/12/2017   TRIG 117.0 08/12/2017   HDL 35.80 (L) 08/12/2017   LDLDIRECT 160.0 12/02/2015   LDLCALC 90 08/12/2017   ALT 16 02/12/2018   AST 13 02/12/2018   NA 140 02/12/2018   K 4.5 02/12/2018   CL 106 02/12/2018   CREATININE 0.78 02/12/2018   BUN 14 02/12/2018   CO2 31 02/12/2018   TSH 0.91 02/11/2017   HGBA1C 5.9 02/12/2018    Mm 3d Screen Breast Bilateral  Result Date: 11/20/2017 CLINICAL DATA:  Screening. EXAM: DIGITAL SCREENING BILATERAL MAMMOGRAM WITH TOMO AND CAD COMPARISON:  Previous exam(s). ACR Breast Density Category b: There are scattered areas of fibroglandular density. FINDINGS: There are no findings suspicious for malignancy. Images were processed with CAD. IMPRESSION: No mammographic evidence of malignancy. A result letter of this screening mammogram will be mailed directly to the patient. RECOMMENDATION: Screening mammogram in one year. (Code:SM-B-01Y) BI-RADS CATEGORY  1: Negative. Electronically Signed   By: Fidela Salisbury M.D.   On: 11/20/2017 16:41    Assessment & Plan:   Problem List Items Addressed This Visit    Insomnia    Aggravated by snoring,  May have OSA but has continually  deferred sleep study and sedating meds. Discussed natural remedies for insomnia including herbal tea and melatonin.  Reviewed principles of good sleep hygiene      Obesity    I have addressed  BMI and recommended a low glycemic index diet utilizing smaller more frequent meals to increase metabolism.  I have also recommended that patient start exercising with a goal of 30 minutes of aerobic exercise a minimum of 5 days per week.        Otitis media with rupture of tympanic membrane, left    TM now appears healed (secondary to infection) but she has a  hearing deficit. She will follow up with audiology if symptoms persist        Prediabetes - Primary    His random glucose is again not elevated but her a1c suggests prediabetes .  I recommend he follow a low glycemic index diet and particpate regularly in an aerobic  exercise activity.  We should check an A1c in 6 months.       Relevant Orders   Comprehensive metabolic panel (Completed)   Hemoglobin A1c (Completed)    A total of 25 minutes of face to face time was spent with patient more than half of which was spent in counselling about the above mentioned conditions  and coordination of care   I have discontinued Atisha C. Visser's buPROPion and azithromycin. I have also changed her buPROPion and simvastatin. Additionally, I am having her maintain her omeprazole, multivitamin with minerals, calcium carbonate, Vitamin D-3, cetirizine, Docusate Sodium (COLACE PO), Melatonin, benzonatate, escitalopram, and gabapentin.  Meds ordered this encounter  Medications  . buPROPion (WELLBUTRIN XL) 300 MG 24 hr tablet    Sig: Take 1 tablet (300 mg total) by mouth daily.    Dispense:  90 tablet    Refill:  2  . simvastatin (ZOCOR) 20 MG tablet    Sig: Take 1 tablet (20 mg total) by mouth at bedtime.    Dispense:  90 tablet    Refill:  2    Medications Discontinued During This Encounter  Medication Reason  . azithromycin (ZITHROMAX) 250 MG tablet  Completed Course  . buPROPion (WELLBUTRIN XL) 300 MG 24 hr tablet Duplicate  . buPROPion (WELLBUTRIN XL) 300 MG 24 hr tablet Reorder  . simvastatin (ZOCOR) 20 MG tablet Reorder    Follow-up: Return in about 6 months (around 08/13/2018).   Crecencio Mc, MD

## 2018-02-13 ENCOUNTER — Encounter: Payer: Self-pay | Admitting: Internal Medicine

## 2018-02-13 DIAGNOSIS — R7303 Prediabetes: Secondary | ICD-10-CM | POA: Insufficient documentation

## 2018-02-13 DIAGNOSIS — H7292 Unspecified perforation of tympanic membrane, left ear: Secondary | ICD-10-CM

## 2018-02-13 DIAGNOSIS — H6692 Otitis media, unspecified, left ear: Secondary | ICD-10-CM

## 2018-02-13 HISTORY — DX: Unspecified perforation of tympanic membrane, left ear: H66.92

## 2018-02-13 HISTORY — DX: Unspecified perforation of tympanic membrane, left ear: H72.92

## 2018-02-13 NOTE — Assessment & Plan Note (Signed)
Aggravated by snoring,  May have OSA but has continually deferred sleep study and sedating meds. Discussed natural remedies for insomnia including herbal tea and melatonin.  Reviewed principles of good sleep hygiene

## 2018-02-13 NOTE — Assessment & Plan Note (Signed)
TM now appears healed (secondary to infection) but she has a  hearing deficit. She will follow up with audiology if symptoms persist

## 2018-02-13 NOTE — Assessment & Plan Note (Signed)
I have addressed  BMI and recommended a low glycemic index diet utilizing smaller more frequent meals to increase metabolism.  I have also recommended that patient start exercising with a goal of 30 minutes of aerobic exercise a minimum of 5 days per week.  

## 2018-02-13 NOTE — Assessment & Plan Note (Signed)
His random glucose is again not elevated but her a1c suggests prediabetes .  I recommend he follow a low glycemic index diet and particpate regularly in an aerobic  exercise activity.  We should check an A1c in 6 months.

## 2018-04-07 ENCOUNTER — Other Ambulatory Visit: Payer: Self-pay | Admitting: Internal Medicine

## 2018-04-25 ENCOUNTER — Ambulatory Visit: Payer: Medicare Other

## 2018-05-14 ENCOUNTER — Ambulatory Visit: Payer: Medicare Other

## 2018-07-09 ENCOUNTER — Other Ambulatory Visit: Payer: Self-pay | Admitting: Internal Medicine

## 2018-07-16 ENCOUNTER — Other Ambulatory Visit: Payer: Self-pay | Admitting: Internal Medicine

## 2018-07-24 ENCOUNTER — Ambulatory Visit (INDEPENDENT_AMBULATORY_CARE_PROVIDER_SITE_OTHER): Payer: Medicare Other | Admitting: Family Medicine

## 2018-07-24 ENCOUNTER — Ambulatory Visit: Payer: Self-pay | Admitting: *Deleted

## 2018-07-24 ENCOUNTER — Telehealth: Payer: Self-pay | Admitting: Family Medicine

## 2018-07-24 ENCOUNTER — Other Ambulatory Visit: Payer: Self-pay

## 2018-07-24 DIAGNOSIS — R5383 Other fatigue: Secondary | ICD-10-CM

## 2018-07-24 DIAGNOSIS — R6889 Other general symptoms and signs: Secondary | ICD-10-CM | POA: Diagnosis not present

## 2018-07-24 DIAGNOSIS — J060 Acute laryngopharyngitis: Secondary | ICD-10-CM | POA: Diagnosis not present

## 2018-07-24 DIAGNOSIS — R0602 Shortness of breath: Secondary | ICD-10-CM | POA: Diagnosis not present

## 2018-07-24 DIAGNOSIS — Z20822 Contact with and (suspected) exposure to covid-19: Secondary | ICD-10-CM

## 2018-07-24 DIAGNOSIS — Z20828 Contact with and (suspected) exposure to other viral communicable diseases: Secondary | ICD-10-CM

## 2018-07-24 NOTE — Progress Notes (Signed)
Patient ID: Dana Burke, female   DOB: 17-Feb-1945, 73 y.o.   MRN: [id number]    Virtual Visit via phone Note  This visit type was conducted due to national recommendations for restrictions regarding the COVID-19 pandemic (e.g. social distancing).  This format is felt to be most appropriate for this patient at this time.  All issues noted in this document were discussed and addressed.  No physical exam was performed (except for noted visual exam findings with Video Visits).   I connected with Evadene Rochford today at  4:00 PM EDT by telephone and verified that I am speaking with the correct person using two identifiers. Location patient: home Location provider: work or home office Persons participating in the virtual visit: patient, provider  I discussed the limitations, risks, security and privacy concerns of performing an evaluation and management service by telephone and the availability of in person appointments. I also discussed with the patient that there may be a patient responsible charge related to this service. The patient expressed understanding and agreed to proceed.  HPI:  Patient and I connected via telephone due to complaints of some shortness of breath, fatigue, scratchy throat with hoarse voice for 3 to 4 days.  Source patient know she has not been in contact with anyone who has tested positive for COVID-19, but she does go out to the store and mall so it is possible.  She has been diligent with her handwashing and wearing a mask when out of the home.  Denies fever or chills.  She has been using Mucinex and nasal spray to help reduce symptoms.  She also has albuterol inhaler to use as needed.  States albuterol does help shortness of breath.  Does not feel like she is wheezing or gasping for air.  No nausea or vomiting.    ROS: See pertinent positives and negatives per HPI.  Past Medical History:  Diagnosis Date  . Depression    recent events  . GERD (gastroesophageal  reflux disease)   . Phlebitis and thrombophlebitis of the leg   . Pulmonary embolism ([facility])   . 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 APPENDECTOMY  1963  . CHOLECYSTECTOMY  1996  . FRACTURE SURGERY  2001   left tibia  . THYROGLOSSAL DUCT CYST N/A 06/22/2015   Procedure: THYROGLOSSAL DUCT CYST;  Surgeon: Clyde Canterbury, MD;  Location: [facility] ORS;  Service: ENT;  Laterality: N/A;    Family History  Problem Relation Age of Onset  . Heart disease Mother   . Hyperlipidemia Mother   . Dementia Mother   . Heart disease Father   . Hyperlipidemia Father   . Diabetes Father   . Cancer Sister        melanoma  . Cancer Sister        breast  . Breast cancer Sister   . Cancer Brother        skin cancer  . Bladder Cancer Brother   . Diabetes Brother   . Cancer Paternal Uncle        colon  . Breast cancer Cousin   . Breast cancer Cousin   . Breast cancer Cousin   . Breast cancer Cousin   . Breast cancer Cousin   . Seizures Grandchild   . Diabetes Brother   . Diabetes Brother    Social History   Tobacco Use  . Smoking status: Former Smoker    Packs/day: 0.40  Types: Cigarettes    Quit date: 07/30/1987    Years since quitting: 31.0  . Smokeless tobacco: Never Used  Substance Use Topics  . Alcohol use: No    Current Outpatient Medications:  .  benzonatate (TESSALON) 100 MG capsule, Take 1 capsule (100 mg total) by mouth 3 (three) times daily as needed for cough., Disp: 30 capsule, Rfl: 1 .  buPROPion (WELLBUTRIN XL) 300 MG 24 hr tablet, TAKE 1 TABLET BY MOUTH ONCE DAILY, Disp: 90 tablet, Rfl: 2 .  calcium carbonate (OS-CAL) 600 MG TABS, Take 600 mg by mouth 2 (two) times daily with a meal., Disp: , Rfl:  .  cetirizine (ZYRTEC) 10 MG tablet, Take 10 mg by mouth at bedtime. , Disp: , Rfl:  .  Cholecalciferol (VITAMIN D-3) 1000 UNITS CAPS, Take 1 capsule by mouth daily., Disp: , Rfl:  .  Docusate Sodium (COLACE PO), Take 1 capsule by  mouth daily., Disp: , Rfl:  .  escitalopram (LEXAPRO) 10 MG tablet, TAKE 1 TABLET BY MOUTH ONCE DAILY, Disp: 90 tablet, Rfl: 1 .  gabapentin (NEURONTIN) 300 MG capsule, TAKE 1 CAPSULE BY MOUTH 4 TIMES DAILY, Disp: 360 capsule, Rfl: 1 .  Melatonin 1 MG TABS, Take 1 tablet by mouth., Disp: , Rfl:  .  Multiple Vitamins-Minerals (MULTIVITAMIN WITH MINERALS) tablet, Take 1 tablet by mouth daily., Disp: , Rfl:  .  omeprazole (PRILOSEC) 20 MG capsule, Take 20 mg by mouth daily., Disp: , Rfl:  .  simvastatin (ZOCOR) 20 MG tablet, Take 1 tablet (20 mg total) by mouth at bedtime., Disp: 90 tablet, Rfl: 2  EXAM:  GENERAL: alert, oriented, sounds to be in no acute distress  LUNGS: Speaking in full sentences (voice does sound raspy) no signs of respiratory distress, breathing rate appears normal, no obvious gross SOB, gasping, coughing or wheezing  PSYCH/NEURO: pleasant and cooperative, no obvious depression or anxiety, speech and thought processing grossly intact  ASSESSMENT AND PLAN:  Discussed the following assessment and plan:  Suspected COVID-19 virus infection, sore throat, fatigue, SOB - advised that due to symptoms, we need to get patient set up for COVID-19 testing.  Patient advised that a nurse will be calling her to give her time and location to drive up for drive-through testing.  Patient advised that testing is taking 2 to 7 days to result, and while we are awaiting results patient must remain under self quarantine and monitor for any changing/worsening symptoms.  Advised over-the-counter medications such as Tylenol can be used to help treat pain or fevers, Robitussin can be used to help calm cough, allergy medication such as Claritin or Allegra can help reduce congestion.  Also discussed getting plenty of rest and increasing fluid intake. Patient has albuterol inhaler to use as needed. Discussed doing salt water gargles to soothe throat.  Made patient aware that test results as well as how  symptoms progress will determine when the self quarantine will be able to end.  Also advised to monitor self for any worsening symptoms, advised if severe shortness of breath develops, high fever that is not reduced with use of Tylenol, chest pain, severe vomiting or diarrhea  --patient must call on-call and or go to ER right away for evaluation. patient verbalized understanding of these instructions.   I discussed the assessment and treatment plan with the patient. The patient was provided an opportunity to ask questions and all were answered. The patient agreed with the plan and demonstrated an understanding of the instructions.  The patient was advised to call back or seek an in-person evaluation if the symptoms worsen or if the condition fails to improve as anticipated.  I provided 13 minutes of non-face-to-face time over phone during this encounter.   Jodelle Green, FNP

## 2018-07-24 NOTE — Telephone Encounter (Signed)
Patient is calling to report she is having some strange symptoms- she is requesting COVID testing- but she may need virtual visit for assessment of her symptoms. Call to office for appointment.  Reason for Disposition . SEVERE sore throat pain    Some throat irritation but not soreness- does struggle to get sound out with vocal cords at times.  Answer Assessment - Initial Assessment Questions 1. DESCRIPTION: "Describe your voice."     Weak and horse 2. ONSET: "When did the hoarseness begin?"     Went to bed Thursday night and Friday morning patient woke up like this 3. COUGH: "Is there a cough?" If so, ask: "How bad?"     No cough 4. FEVER: "Do you have a fever?" If so, ask: "What is your temperature, how was it measured, and when did it start?"     No fever 5. RESPIRATORY STATUS: "Describe your breathing."      SOB on exertion and some wheezing mostly in the evening and at night 6. ALLERGIES: "Any allergy symptoms?" If so, ask: "What are they?"     occasional headache 7. IRRITANTS: "Do you smoke?" "Have you been exposed to any irritating fumes?"     no 8. CAUSE: "What do you think is causing the hoarseness?"     Not sure- wants COVID testing 9. OTHER SYMPTOMS: "Do you have any other symptoms?" (e.g., sore throat, swelling, foreign body, rash)     fatigue 10. PREGNANCY: "Is there any chance you are pregnant?" "When was your last menstrual period?"       n/a  Protocols used: [id number]

## 2018-07-24 NOTE — Telephone Encounter (Signed)
Patient has a virtual visit today scheduled with Philis Nettle, FNP at 4:00 pm.

## 2018-07-24 NOTE — Telephone Encounter (Signed)
Dana Burke  16-Oct-1945  [id number]  Need for covid test -- hoarse throat, cough, fatigue  [employer]

## 2018-07-25 ENCOUNTER — Other Ambulatory Visit: Payer: Medicare Other

## 2018-07-25 ENCOUNTER — Telehealth: Payer: Self-pay | Admitting: *Deleted

## 2018-07-25 DIAGNOSIS — Z20822 Contact with and (suspected) exposure to covid-19: Secondary | ICD-10-CM

## 2018-07-25 DIAGNOSIS — R6889 Other general symptoms and signs: Secondary | ICD-10-CM | POA: Diagnosis not present

## 2018-07-25 NOTE — Telephone Encounter (Signed)
Patient is calling ot see if her order is in for testing-   Dana Burke  11/19/1945  [id number]  Need for covid test -- hoarse throat, cough, fatigue  [employer]      Documentation    Patient scheduled and order placed.

## 2018-07-25 NOTE — Telephone Encounter (Signed)
Patient scheduled.

## 2018-07-30 LAB — NOVEL CORONAVIRUS, NAA: SARS-CoV-2, NAA: NOT DETECTED

## 2018-07-31 ENCOUNTER — Encounter: Payer: Self-pay | Admitting: Family Medicine

## 2018-07-31 ENCOUNTER — Ambulatory Visit (INDEPENDENT_AMBULATORY_CARE_PROVIDER_SITE_OTHER): Payer: Medicare Other | Admitting: Family Medicine

## 2018-07-31 ENCOUNTER — Telehealth: Payer: Self-pay | Admitting: Internal Medicine

## 2018-07-31 ENCOUNTER — Other Ambulatory Visit: Payer: Self-pay

## 2018-07-31 DIAGNOSIS — R0602 Shortness of breath: Secondary | ICD-10-CM

## 2018-07-31 DIAGNOSIS — J040 Acute laryngitis: Secondary | ICD-10-CM | POA: Diagnosis not present

## 2018-07-31 DIAGNOSIS — R062 Wheezing: Secondary | ICD-10-CM

## 2018-07-31 DIAGNOSIS — J988 Other specified respiratory disorders: Secondary | ICD-10-CM

## 2018-07-31 MED ORDER — PREDNISONE 10 MG (21) PO TBPK: ORAL_TABLET | ORAL | 0 refills | Status: DC

## 2018-07-31 MED ORDER — BUDESONIDE-FORMOTEROL FUMARATE 160-4.5 MCG/ACT IN AERO: 2.0000 | INHALATION_SPRAY | Freq: Two times a day (BID) | RESPIRATORY_TRACT | 0 refills | Status: DC

## 2018-07-31 MED ORDER — DOXYCYCLINE HYCLATE 100 MG PO TABS: 100.0000 mg | ORAL_TABLET | Freq: Two times a day (BID) | ORAL | 0 refills | Status: DC

## 2018-07-31 NOTE — Telephone Encounter (Signed)
Pt called stating she has been hoarse for 3wks. Pt is requesting a round of prednisone be sent in for her since her covid test came back negative. Please advise.   [address], [place] [address]  Phone: [phone] Fax: [phone]  Not a 24 hour pharmacy; exact hours not known.

## 2018-07-31 NOTE — Telephone Encounter (Signed)
Called and spoke to patient.  Pt c/o being hoarse for the past 3 weeks.  Pt also said that she has some wheezing when she exhales and a little SOB upon exertion.  Pt said that symptoms had worsen a week ago but hasn't gotten any worse since.  Pt said that in the past 6 months she had 3 or 4 episodes of respiratory problems.  Pt said that it usually turns into bronchitis.   Pt said that she usually has to take an antibiotic and the prednisone in the past to clear up symptoms.  Pt said that she was tested for COVID but test come back negative.     Scheduled pt a phone appt with Philis Nettle, FNP today at 3:00 pm.   Advised pt to go UC if symptoms worsen.  Pt said that she was not in any distress and understood to go to UC if symptoms worsen.

## 2018-07-31 NOTE — Progress Notes (Signed)
Patient ID: Dana Burke, female   DOB: [birth year], 73 y.o.   MRN: [id number]    Virtual Visit via phoneNote  This visit type was conducted due to national recommendations for restrictions regarding the COVID-19 pandemic (e.g. social distancing).  This format is felt to be most appropriate for this patient at this time.  All issues noted in this document were discussed and addressed.  No physical exam was performed (except for noted visual exam findings with Video Visits).   I connected with Koi Maj today at  3:00 PM EDT by telephone and verified that I am speaking with the correct person using two identifiers. Location patient: home Location provider: work or home office Persons participating in the virtual visit: patient, provider  I discussed the limitations, risks, security and privacy concerns of performing an evaluation and management service by telephone and the availability of in person appointments. I also discussed with the patient that there may be a patient responsible charge related to this service. The patient expressed understanding and agreed to proceed.   HPI: Patient and I connected via telephone due to complaints of wheezing, hoarseness and some shortness of breath and chest congestion.  Patient had similar symptoms last week on 07/24/2018 and we set her up for COVID-19 testing.  Test results came back negative, and she did use inhaler and cough medication as advised but symptoms have continued to persist.  Denies coughing of thick phlegm.  States the albuterol inhaler will help to calm down shortness of breath and wheezing, but a few hours later the symptoms returned.  Denies any fever or chills.  No body aches.  Does not feel weak or fatigued.  She is eating and drinking normally.  No nausea, vomiting or diarrhea.  No urinary issues.   ROS: See pertinent positives and negatives per HPI.  Past Medical History:  Diagnosis Date  . Depression    recent events  .  GERD (gastroesophageal reflux disease)   . Phlebitis and thrombophlebitis of the leg   . Pulmonary embolism ([place])   . 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 APPENDECTOMY  1963  . CHOLECYSTECTOMY  1996  . FRACTURE SURGERY  2001   left tibia  . THYROGLOSSAL DUCT CYST N/A 06/22/2015   Procedure: THYROGLOSSAL DUCT CYST;  Surgeon: Clyde Canterbury, MD;  Location: [facility] ORS;  Service: ENT;  Laterality: N/A;    Family History  Problem Relation Age of Onset  . Heart disease Mother   . Hyperlipidemia Mother   . Dementia Mother   . Heart disease Father   . Hyperlipidemia Father   . Diabetes Father   . Cancer Sister        melanoma  . Cancer Sister        breast  . Breast cancer Sister   . Cancer Brother        skin cancer  . Bladder Cancer Brother   . Diabetes Brother   . Cancer Paternal Uncle        colon  . Breast cancer Cousin   . Breast cancer Cousin   . Breast cancer Cousin   . Breast cancer Cousin   . Breast cancer Cousin   . Seizures Grandchild   . Diabetes Brother   . Diabetes Brother    Social History   Tobacco Use  . Smoking status: Former Smoker    Packs/day: 0.40    Types: Cigarettes    Quit date:  07/30/1987    Years since quitting: 31.0  . Smokeless tobacco: Never Used  Substance Use Topics  . Alcohol use: No    Current Outpatient Medications:  .  benzonatate (TESSALON) 100 MG capsule, Take 1 capsule (100 mg total) by mouth 3 (three) times daily as needed for cough., Disp: 30 capsule, Rfl: 1 .  buPROPion (WELLBUTRIN XL) 300 MG 24 hr tablet, TAKE 1 TABLET BY MOUTH ONCE DAILY, Disp: 90 tablet, Rfl: 2 .  calcium carbonate (OS-CAL) 600 MG TABS, Take 600 mg by mouth 2 (two) times daily with a meal., Disp: , Rfl:  .  cetirizine (ZYRTEC) 10 MG tablet, Take 10 mg by mouth at bedtime. , Disp: , Rfl:  .  Cholecalciferol (VITAMIN D-3) 1000 UNITS CAPS, Take 1 capsule by mouth daily., Disp: , Rfl:  .  Docusate Sodium (COLACE  PO), Take 1 capsule by mouth daily., Disp: , Rfl:  .  escitalopram (LEXAPRO) 10 MG tablet, TAKE 1 TABLET BY MOUTH ONCE DAILY, Disp: 90 tablet, Rfl: 1 .  gabapentin (NEURONTIN) 300 MG capsule, TAKE 1 CAPSULE BY MOUTH 4 TIMES DAILY, Disp: 360 capsule, Rfl: 1 .  Melatonin 1 MG TABS, Take 1 tablet by mouth., Disp: , Rfl:  .  Multiple Vitamins-Minerals (MULTIVITAMIN WITH MINERALS) tablet, Take 1 tablet by mouth daily., Disp: , Rfl:  .  omeprazole (PRILOSEC) 20 MG capsule, Take 20 mg by mouth daily., Disp: , Rfl:  .  simvastatin (ZOCOR) 20 MG tablet, Take 1 tablet (20 mg total) by mouth at bedtime., Disp: 90 tablet, Rfl: 2  EXAM:  GENERAL: alert, oriented, sounds in no acute distress  LUNGS: Speaking in full sentences, no signs of respiratory distress, breathing rate appears normal, no obvious gross SOB, gasping or wheezing. Does have raspy voice over phone, improves with throat clearing. Also has moist cough while we are on phone.   CV: no obvious cyanosis  MS: moves all visible extremities without noticeable abnormality  PSYCH/NEURO: pleasant and cooperative, no obvious depression or anxiety, speech and thought processing grossly intact  ASSESSMENT AND PLAN:  Discussed the following assessment and plan:  Respiratory infection, SOB, wheezing, laryngitis- due to patient's persistence of symptoms we will treat with doxycycline course to cover respiratory infection.  She will also take oral steroid taper and we will add on Symbicort 2 puffs twice daily to help open up her lungs.  She will continue to use albuterol as needed for any shortness of breath or wheezing.  I am hoping the prednisone will also help calm down her hoarse voice and treat the laryngitis.  Advised to get plenty of rest, do good handwashing, drink lots of fluids and monitor cell for any worsening of symptoms.  Advised to go to emergency department right away if she develops shortness of breath that is not helped with use of  inhalers or if any other severe symptoms develop such as chest pain, feeling weak and dizzy, high fever.    I discussed the assessment and treatment plan with the patient. The patient was provided an opportunity to ask questions and all were answered. The patient agreed with the plan and demonstrated an understanding of the instructions.   The patient was advised to call back or seek an in-person evaluation if the symptoms worsen or if the condition fails to improve as anticipated.  I provided 15 minutes of non-face-to-face time during this encounter.   Jodelle Green, FNP

## 2018-07-31 NOTE — Telephone Encounter (Signed)
Negative results was given to pt °

## 2018-08-13 ENCOUNTER — Other Ambulatory Visit: Payer: Self-pay

## 2018-08-13 ENCOUNTER — Ambulatory Visit (INDEPENDENT_AMBULATORY_CARE_PROVIDER_SITE_OTHER): Payer: Medicare Other | Admitting: Internal Medicine

## 2018-08-13 ENCOUNTER — Ambulatory Visit: Payer: Medicare Other | Admitting: Internal Medicine

## 2018-08-13 ENCOUNTER — Encounter: Payer: Self-pay | Admitting: Internal Medicine

## 2018-08-13 DIAGNOSIS — R062 Wheezing: Secondary | ICD-10-CM | POA: Diagnosis not present

## 2018-08-13 DIAGNOSIS — E782 Mixed hyperlipidemia: Secondary | ICD-10-CM | POA: Diagnosis not present

## 2018-08-13 DIAGNOSIS — R7303 Prediabetes: Secondary | ICD-10-CM | POA: Diagnosis not present

## 2018-08-13 DIAGNOSIS — R059 Cough, unspecified: Secondary | ICD-10-CM

## 2018-08-13 DIAGNOSIS — R5383 Other fatigue: Secondary | ICD-10-CM

## 2018-08-13 DIAGNOSIS — K219 Gastro-esophageal reflux disease without esophagitis: Secondary | ICD-10-CM

## 2018-08-13 DIAGNOSIS — R0609 Other forms of dyspnea: Secondary | ICD-10-CM | POA: Insufficient documentation

## 2018-08-13 DIAGNOSIS — R0683 Snoring: Secondary | ICD-10-CM | POA: Diagnosis not present

## 2018-08-13 DIAGNOSIS — R05 Cough: Secondary | ICD-10-CM | POA: Diagnosis not present

## 2018-08-13 DIAGNOSIS — R0600 Dyspnea, unspecified: Secondary | ICD-10-CM | POA: Insufficient documentation

## 2018-08-13 DIAGNOSIS — R0602 Shortness of breath: Secondary | ICD-10-CM | POA: Insufficient documentation

## 2018-08-13 DIAGNOSIS — R0689 Other abnormalities of breathing: Secondary | ICD-10-CM | POA: Diagnosis not present

## 2018-08-13 NOTE — Assessment & Plan Note (Signed)
Suggested by A1c .  I recommend he follow a low glycemic index diet and particpate regularly in an aerobic  exercise activity.repeat assessment due.   Lab Results  Component Value Date   HGBA1C 5.9 02/12/2018

## 2018-08-13 NOTE — Progress Notes (Signed)
Telephone Note  This visit type was conducted due to national recommendations for restrictions regarding the COVID-19 pandemic (e.g. social distancing).  This format is felt to be most appropriate for this patient at this time.  All issues noted in this document were discussed and addressed.  No physical exam was performed (except for noted visual exam findings with Video Visits).   I connected with@ on 08/13/18 at  8:30 AM EDT by a video enabled  telephone and verified that I am speaking with the correct person using two identifiers. Location patient: home Location provider: work or home office Persons participating in the virtual visit: patient, provider  I discussed the limitations, risks, security and privacy concerns of performing an evaluation and management service by telephone and the availability of in person appointments. I also discussed with the patient that there may be a patient responsible charge related to this service. The patient expressed understanding and agreed to proceed.  Reason for visit: follow up 6 month pm prediabetes,  Persistent hoarseness, wheezing   HPI:  Treated for URI IN July BY LAUREN GUSE,   TESTED FOR COVID 19 NEGATIVE  Prior to appt,  Was given doxy,  steroids and symbicort .  No chest x ray done .  Symptoms of moist cough improved and daytime wheezing improved,  But the wheezing is still occurring  at night, and she remains very fatigued  And hoarse .  No prior pulmonology or cardiology evaluation . No prior sleep study . Was unable to afford symbicort due to cost , using only albuterol.   .   She has had GERD managed with prilosec for the past 20 years .    ROS: See pertinent positives and negatives per HPI.  Past Medical History:  Diagnosis Date  . Depression    recent events  . GERD (gastroesophageal reflux disease)   . Otitis media with rupture of tympanic membrane, left 02/13/2018  . Phlebitis and thrombophlebitis of the leg   . Pulmonary  embolism ([place])   . 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 APPENDECTOMY  1963  . CHOLECYSTECTOMY  1996  . FRACTURE SURGERY  2001   left tibia  . THYROGLOSSAL DUCT CYST N/A 06/22/2015   Procedure: THYROGLOSSAL DUCT CYST;  Surgeon: Clyde Canterbury, MD;  Location: [facility] ORS;  Service: ENT;  Laterality: N/A;    Family History  Problem Relation Age of Onset  . Heart disease Mother   . Hyperlipidemia Mother   . Dementia Mother   . Heart disease Father   . Hyperlipidemia Father   . Diabetes Father   . Cancer Sister        melanoma  . Cancer Sister        breast  . Breast cancer Sister   . Cancer Brother        skin cancer  . Bladder Cancer Brother   . Diabetes Brother   . Cancer Paternal Uncle        colon  . Breast cancer Cousin   . Breast cancer Cousin   . Breast cancer Cousin   . Breast cancer Cousin   . Breast cancer Cousin   . Seizures Grandchild   . Diabetes Brother   . Diabetes Brother     SOCIAL HX:  reports that she quit smoking about 31 years ago. Her smoking use included cigarettes. She smoked 0.40 packs per day. She has never used smokeless tobacco. She reports that  she does not drink alcohol or use drugs.   Current Outpatient Medications:  .  benzonatate (TESSALON) 100 MG capsule, Take 1 capsule (100 mg total) by mouth 3 (three) times daily as needed for cough., Disp: 30 capsule, Rfl: 1 .  buPROPion (WELLBUTRIN XL) 300 MG 24 hr tablet, TAKE 1 TABLET BY MOUTH ONCE DAILY, Disp: 90 tablet, Rfl: 2 .  calcium carbonate (OS-CAL) 600 MG TABS, Take 600 mg by mouth 2 (two) times daily with a meal., Disp: , Rfl:  .  cetirizine (ZYRTEC) 10 MG tablet, Take 10 mg by mouth at bedtime. , Disp: , Rfl:  .  Cholecalciferol (VITAMIN D-3) 1000 UNITS CAPS, Take 1 capsule by mouth daily., Disp: , Rfl:  .  Docusate Sodium (COLACE PO), Take 1 capsule by mouth daily., Disp: , Rfl:  .  escitalopram (LEXAPRO) 10 MG tablet, TAKE 1 TABLET BY MOUTH  ONCE DAILY, Disp: 90 tablet, Rfl: 1 .  gabapentin (NEURONTIN) 300 MG capsule, TAKE 1 CAPSULE BY MOUTH 4 TIMES DAILY (Patient taking differently: Take 300 mg by mouth 5 (five) times daily. ), Disp: 360 capsule, Rfl: 1 .  Melatonin 1 MG TABS, Take 1 tablet by mouth., Disp: , Rfl:  .  Multiple Vitamins-Minerals (MULTIVITAMIN WITH MINERALS) tablet, Take 1 tablet by mouth daily., Disp: , Rfl:  .  omeprazole (PRILOSEC) 20 MG capsule, Take 20 mg by mouth daily., Disp: , Rfl:  .  simvastatin (ZOCOR) 20 MG tablet, Take 1 tablet (20 mg total) by mouth at bedtime., Disp: 90 tablet, Rfl: 2  EXAM:   General impression: alert, cooperative and articulate.  No signs of being in distress  Lungs: speech is fluent sentence length suggests that patient is not short of breath and not punctuated by cough, sneezing or sniffing. Voice is hoarse .   Psych: affect normal.  speech is articulate and non pressured .  Denies suicidal thoughts   ASSESSMENT AND PLAN:    Prediabetes Suggested by A1c .  I recommend he follow a low glycemic index diet and particpate regularly in an aerobic  exercise activity.repeat assessment due.   Lab Results  Component Value Date   HGBA1C 5.9 02/12/2018     Dyspnea and respiratory abnormalities Recurrent  With nocturnal symptoms , may be describing orthopnea. Chest x ray,  ECHO ,  Sleep study and referral to Dr Patsey Berthold planned   GERD (gastroesophageal reflux disease) Will increase PPI dose to  Bid     I discussed the assessment and treatment plan with the patient. The patient was provided an opportunity to ask questions and all were answered. The patient agreed with the plan and demonstrated an understanding of the instructions.   The patient was advised to call back or seek an in-person evaluation if the symptoms worsen or if the condition fails to improve as anticipated.  I provided 30 minutes of non-face-to-face time during this encounter.   Crecencio Mc, MD

## 2018-08-13 NOTE — Assessment & Plan Note (Addendum)
Recurrent  With nocturnal symptoms , may be describing orthopnea. Chest x ray,  ECHO ,  Sleep study and referral to Dr Patsey Berthold planned

## 2018-08-13 NOTE — Assessment & Plan Note (Signed)
Will increase PPI dose to  Bid

## 2018-08-14 ENCOUNTER — Telehealth: Payer: Self-pay | Admitting: Pulmonary Disease

## 2018-08-14 NOTE — Telephone Encounter (Signed)
Received referral for pt to be seen for wheezing and cough. Spoke with pt to schedule appt. She has the following symptoms: wheezing, SOB, hoarseness, cough has improved. Ok to do office visit or should this be virtual/phone?

## 2018-08-14 NOTE — Telephone Encounter (Signed)
Per Dr.Gonzalaz leave appointment as scheduled.

## 2018-08-15 ENCOUNTER — Telehealth: Payer: Self-pay | Admitting: Pulmonary Disease

## 2018-08-15 NOTE — Telephone Encounter (Signed)
Called patient for COVID-19 pre-screening for in office visit.  Have you recently traveled any where out of the local area in the last 2 weeks? No  Have you been in close contact with a person diagnosed with COVID-19 or someone awaiting results within the last 2 weeks? No  Do you currently have any of the following symptoms? If so, when did they start? Cough  Fever      Muscle Pain   Red eyes Shortness of breath (Yes- )  Abdominal pain             Vomiting Loss of smell    Rash    Sore Throat Headache    Weakness   Bruising or bleeding   Okay to proceed with visit 08/15/2018

## 2018-08-16 HISTORY — PX: TRANSTHORACIC ECHOCARDIOGRAM: SHX275

## 2018-08-19 ENCOUNTER — Institutional Professional Consult (permissible substitution): Payer: Medicare Other | Admitting: Pulmonary Disease

## 2018-08-20 ENCOUNTER — Telehealth: Payer: Self-pay

## 2018-08-20 DIAGNOSIS — E782 Mixed hyperlipidemia: Secondary | ICD-10-CM

## 2018-08-20 DIAGNOSIS — R7303 Prediabetes: Secondary | ICD-10-CM

## 2018-08-20 DIAGNOSIS — R062 Wheezing: Secondary | ICD-10-CM

## 2018-08-20 NOTE — Telephone Encounter (Signed)
LMTCB. Please transfer pt to our office.  

## 2018-08-21 ENCOUNTER — Telehealth: Payer: Self-pay | Admitting: Pulmonary Disease

## 2018-08-21 NOTE — Telephone Encounter (Signed)
Called patient for COVID-19 pre-screening for in office visit.  Have you recently traveled any where out of the local area in the last 2 weeks? No  Have you been in close contact with a person diagnosed with COVID-19 or someone awaiting results within the last 2 weeks? No  Do you currently have any of the following symptoms? If so, when did they start? Cough                 Diarrhea   Joint Pain Fever      Muscle Pain   Red eyes Shortness of breath (Yes- a month) Abdominal pain             Vomiting Loss of smell    Rash    Sore Throat Headache    Weakness   Bruising or bleeding  Hoarseness - one month   Okay to proceed with visit. (date)  / Needs to reschedule visit. (date)

## 2018-08-21 NOTE — Telephone Encounter (Signed)
Patient wife returning [place] call. No answer on flow line.

## 2018-08-21 NOTE — Addendum Note (Signed)
Addended by: Adair Laundry on: 08/21/2018 03:01 PM   Modules accepted: Orders

## 2018-08-21 NOTE — Telephone Encounter (Signed)
Spoke with pt to let her know that she would have to go to the hospital in the [place] to have her labs and xray done because of the symptoms that she is having. Told pt that she would not need an appt to have either of these done. Pt gave a verbal understanding and stated that she would at least have the xray done tomorrow since she has an appt over there any ways.

## 2018-08-21 NOTE — Telephone Encounter (Signed)
Okay to proceed with appointment.

## 2018-08-22 ENCOUNTER — Ambulatory Visit (INDEPENDENT_AMBULATORY_CARE_PROVIDER_SITE_OTHER): Payer: Medicare Other | Admitting: Pulmonary Disease

## 2018-08-22 ENCOUNTER — Ambulatory Visit
Admission: RE | Admit: 2018-08-22 | Discharge: 2018-08-22 | Disposition: A | Discharge status: Home / Self Care | Payer: Medicare Other | Source: Ambulatory Visit | Attending: Internal Medicine | Admitting: Internal Medicine

## 2018-08-22 ENCOUNTER — Other Ambulatory Visit: Payer: Self-pay

## 2018-08-22 ENCOUNTER — Encounter: Payer: Self-pay | Admitting: Pulmonary Disease

## 2018-08-22 VITALS — BP 122/68 | HR 84 | Temp 97.2°F | Ht 69.0 in | Wt 253.4 lb

## 2018-08-22 DIAGNOSIS — R918 Other nonspecific abnormal finding of lung field: Secondary | ICD-10-CM | POA: Diagnosis not present

## 2018-08-22 DIAGNOSIS — R062 Wheezing: Secondary | ICD-10-CM

## 2018-08-22 DIAGNOSIS — R490 Dysphonia: Secondary | ICD-10-CM | POA: Diagnosis not present

## 2018-08-22 DIAGNOSIS — K219 Gastro-esophageal reflux disease without esophagitis: Secondary | ICD-10-CM

## 2018-08-22 DIAGNOSIS — E669 Obesity, unspecified: Secondary | ICD-10-CM

## 2018-08-22 DIAGNOSIS — R0602 Shortness of breath: Secondary | ICD-10-CM

## 2018-08-22 DIAGNOSIS — J984 Other disorders of lung: Secondary | ICD-10-CM | POA: Diagnosis not present

## 2018-08-22 NOTE — Patient Instructions (Addendum)
1.  Rest your voice as much as possible.  Only speak when necessary.  2.  Proceed with getting your chest x-ray done.  We will review this and let you know if there is anything that needs immediate attention.  3.  We will send a referral to ENT for evaluation of your hoarseness.  Particularly with your prior history of thyroglossal cyst.  4.  Take your Prilosec twice a day.  5.  We will see you in follow-up in 2 to 3 weeks time.

## 2018-08-22 NOTE — Progress Notes (Signed)
Subjective:    Patient ID: Dana Burke, female    DOB: 15-Oct-1945, 73 y.o.   MRN: [id number]  HPI Patient is 73 year old remote former smoker (quit 1989) who presents for evaluation of wheezing, shortness of breath and hoarseness.  Symptoms have been lasting for approximately a month.  Patient is kindly referred by Dr. Deborra Medina. She states that since November 2019 she has had recurrent bouts with the same issue.  However this has now seemed to have lingered over the period of a month.  She has noted hoarseness that comes and goes.  She has noticed dyspnea.  She does not report any fevers, chills but has had sweats.  She has had similar issues previously for which she required ENT surgery for what appears to have been a thyroglossal cyst.  She has noted sinus congestion and postnasal drip that are quite bothersome.  She also has had issues with reflux.  She is currently on omeprazole but is only once daily.  She has been on this for several years.  She has not had any chest pain, paroxysmal nocturnal dyspnea and orthopnea.  No lower extremity edema.  No sputum production no hemoptysis.  She really has not had much of a cough at all.  Past medical history, surgical history and family history have been reviewed and are as noted.  She is retired and has not had any issues with prior occupational exposure.  She smoked remotely quit in 1989.  Smoked only for 2 years less than half a pack a day.  Review of Systems  Constitutional: Positive for diaphoresis.  HENT: Positive for congestion, postnasal drip and voice change.   Eyes: Negative.   Respiratory: Positive for wheezing.   Cardiovascular: Negative.   Gastrointestinal:       She does endorse reflux symptoms.  Endocrine: Negative.   Genitourinary: Negative.   Musculoskeletal: Negative.   Skin: Negative.   Allergic/Immunologic: Negative.   Neurological: Negative.   Hematological: Negative.   Psychiatric/Behavioral: Negative.   All  other systems reviewed and are negative.      Objective:   Physical Exam Vitals signs and nursing note reviewed.  Constitutional:      General: She is not in acute distress.    Appearance: Normal appearance. She is obese. She is not ill-appearing.  HENT:     Head: Normocephalic and atraumatic.     Right Ear: External ear normal.     Left Ear: External ear normal.     Nose:     Comments: Nose/mouth/throat not examined due to masking requirements for COVID 19.    Mouth/Throat:     Comments: Hoarse. Eyes:     General: No scleral icterus.    Conjunctiva/sclera: Conjunctivae normal.     Pupils: Pupils are equal, round, and reactive to light.  Neck:     Musculoskeletal: Neck supple.     Thyroid: No thyromegaly.     Trachea: Trachea normal.     Comments: She is hoarse.  "Pseudo-wheeze" noted this is limited to the upper airway Cardiovascular:     Rate and Rhythm: Normal rate and regular rhythm.     Pulses: Normal pulses.     Heart sounds: Normal heart sounds.  Pulmonary:     Effort: Pulmonary effort is normal.     Breath sounds: No wheezing (No true wheezes.).     Comments: Coarse breath sounds, good air entry bilaterally. Abdominal:     General: Abdomen is protuberant. There is no distension.  Musculoskeletal: Normal range of motion.     Right lower leg: No edema.     Left lower leg: No edema.  Lymphadenopathy:     Cervical: No cervical adenopathy.  Skin:    General: Skin is warm and dry.  Neurological:     General: No focal deficit present.     Mental Status: She is alert and oriented to person, place, and time.  Psychiatric:        Mood and Affect: Mood normal.        Behavior: Behavior normal.           Assessment & Plan:   1.  Hoarseness: Suspect this may be due to vocal cord dysfunction, laryngopharyngeal reflux mediated, other possibilities include potential recurrent thyroglossal cyst.  She has had issues with this previously.  Recommend referral to ENT.  I  also recommended voice rest.  2.  Dyspnea/shortness of breath: I suspect that this is related to the issue above particularly she is having issues with reflux and vocal cord dysfunction this can give the sensation of dyspnea due to paradoxical vocal cord motion.  She has a chest x-ray ordered by Dr. Derrel Nip recommend that she proceed with this.  We will review this and let her know if any further treatment is needed.  3.  Gastroesophageal reflux with laryngopharyngeal component: Recommend antireflux measures and increase omeprazole to twice a day.  4.  Obesity class II: This issue adds complexity to her management.  Weight loss has been recommended.   Thank you for allowing me to participate in this patient's care.   This chart was dictated using voice recognition software/Dragon.  Despite best efforts to proofread, errors can occur which can change the meaning.  Any change was purely unintentional.

## 2018-08-25 ENCOUNTER — Telehealth: Payer: Self-pay | Admitting: Pulmonary Disease

## 2018-08-25 ENCOUNTER — Telehealth: Payer: Self-pay

## 2018-08-25 DIAGNOSIS — R490 Dysphonia: Secondary | ICD-10-CM | POA: Diagnosis not present

## 2018-08-25 DIAGNOSIS — K219 Gastro-esophageal reflux disease without esophagitis: Secondary | ICD-10-CM | POA: Diagnosis not present

## 2018-08-25 MED ORDER — CLARITHROMYCIN 500 MG PO TABS: 500.0000 mg | ORAL_TABLET | Freq: Two times a day (BID) | ORAL | 0 refills | Status: AC

## 2018-08-25 NOTE — Telephone Encounter (Signed)
Copied from [place] [id number]. Topic: General - Other >> Aug 25, 2018 10:03 AM Leward Quan A wrote: Reason for CRM: Patient called in would like a call back with the results of her chest Xray done on 08/22/2018. Ph#  [phone]

## 2018-08-25 NOTE — Telephone Encounter (Signed)
Spoke with pt and informed her of her chest xray results. Pt gave a verbal understanding and stated that she would call Dr. Patsey Berthold to see how to follow up.

## 2018-08-25 NOTE — Telephone Encounter (Signed)
I sent the results to Dr Patsey Berthold because per her office note,   she told patient she would follow up with her. The chest x ray is abnormal and appears to have inflammation or infection

## 2018-08-25 NOTE — Telephone Encounter (Signed)
Pt returned call and was notified that she needs to start Biaxin 500 mg BID x7 days in case of an atypical infection such as mycoplasma and to keep her follow-up appointment as scheduled. She was advised that the findings are very subtle and possibly chronic. Understanding verbalized and rx sent to [place]. NKDA per patient.

## 2018-08-25 NOTE — Telephone Encounter (Signed)
Pt is requesting chest xray results

## 2018-08-25 NOTE — Telephone Encounter (Signed)
Attempted to contact pt re CXR results and  Dr.Gonzalez's recommendation to Start Biaxin 500mg  BID x7 days if ENT or PCP did not start her on one. No answer at home number listed and unable to leave message will tray again later.

## 2018-08-25 NOTE — Telephone Encounter (Signed)
I have reviewed the patient's films independently.  The findings are very subtle and may be chronic.  She still needs evaluation of her hoarseness by ENT as I do not think this is related to what ever the chest x-ray shows unless she is having issues with reflux and aspiration.  If after the evaluation by ENT she has not been placed on antibiotic I would give a trial of Biaxin 500 mg twice daily for 7 days in case this may be related to atypical infection such as mycoplasma.

## 2018-08-25 NOTE — Telephone Encounter (Signed)
Pt stated that she was seen Friday and had a CXR done when she left and her PCP has since called her with the results that show either inflammation or infectious process. Pt is scheduled this afternoon to see ENT and was curious if she should keep that appt. Pt was advised to keep appointment with Dr.Bennett (ENT) today and f/u with [place] as scheduled. I will send message to Dr.Gonzalez to advise on CXR and call pt back with any new recommendations. Pt verbalized understanding.

## 2018-08-28 ENCOUNTER — Other Ambulatory Visit: Payer: Self-pay

## 2018-08-28 ENCOUNTER — Other Ambulatory Visit
Admission: RE | Admit: 2018-08-28 | Discharge: 2018-08-28 | Disposition: A | Discharge status: Home / Self Care | Payer: Medicare Other | Source: Ambulatory Visit | Attending: Internal Medicine | Admitting: Internal Medicine

## 2018-08-28 DIAGNOSIS — R7303 Prediabetes: Secondary | ICD-10-CM

## 2018-08-28 DIAGNOSIS — R062 Wheezing: Secondary | ICD-10-CM | POA: Diagnosis not present

## 2018-08-28 DIAGNOSIS — E782 Mixed hyperlipidemia: Secondary | ICD-10-CM | POA: Diagnosis not present

## 2018-08-28 LAB — LIPID PANEL
Cholesterol: 127 mg/dL (ref 0–200)
HDL: 33 mg/dL — ABNORMAL LOW (ref >40)
LDL Cholesterol: 76 mg/dL (ref 0–99)
Total CHOL/HDL Ratio: 3.8 RATIO
Triglycerides: 88 mg/dL (ref <150)
VLDL: 18 mg/dL (ref 0–40)

## 2018-08-28 LAB — CBC WITH DIFFERENTIAL/PLATELET
Abs Immature Granulocytes: 0.01 10*3/uL (ref 0.00–0.07)
Basophils Absolute: 0 10*3/uL (ref 0.0–0.1)
Basophils Relative: 1 %
Eosinophils Absolute: 0.2 10*3/uL (ref 0.0–0.5)
Eosinophils Relative: 3 %
HCT: 41.6 % (ref 36.0–46.0)
Hemoglobin: 13.9 g/dL (ref 12.0–15.0)
Immature Granulocytes: 0 %
Lymphocytes Relative: 36 %
Lymphs Abs: 1.7 10*3/uL (ref 0.7–4.0)
MCH: 30.8 pg (ref 26.0–34.0)
MCHC: 33.4 g/dL (ref 30.0–36.0)
MCV: 92.2 fL (ref 80.0–100.0)
Monocytes Absolute: 0.4 10*3/uL (ref 0.1–1.0)
Monocytes Relative: 8 %
Neutro Abs: 2.5 10*3/uL (ref 1.7–7.7)
Neutrophils Relative %: 52 %
Platelets: 196 10*3/uL (ref 150–400)
RBC: 4.51 MIL/uL (ref 3.87–5.11)
RDW: 12.5 % (ref 11.5–15.5)
WBC: 4.8 10*3/uL (ref 4.0–10.5)
nRBC: 0 % (ref 0.0–0.2)

## 2018-08-28 LAB — COMPREHENSIVE METABOLIC PANEL
ALT: 22 U/L (ref 0–44)
AST: 19 U/L (ref 15–41)
Albumin: 3.9 g/dL (ref 3.5–5.0)
Alkaline Phosphatase: 68 U/L (ref 38–126)
Anion gap: 7 (ref 5–15)
BUN: 13 mg/dL (ref 8–23)
CO2: 25 mmol/L (ref 22–32)
Calcium: 9.7 mg/dL (ref 8.9–10.3)
Chloride: 108 mmol/L (ref 98–111)
Creatinine, Ser: 0.78 mg/dL (ref 0.44–1.00)
GFR calc Af Amer: >60 mL/min (ref >60)
GFR calc non Af Amer: >60 mL/min (ref >60)
Glucose, Bld: 103 mg/dL — ABNORMAL HIGH (ref 70–99)
Potassium: 4.4 mmol/L (ref 3.5–5.1)
Sodium: 140 mmol/L (ref 135–145)
Total Bilirubin: 0.7 mg/dL (ref 0.3–1.2)
Total Protein: 6.5 g/dL (ref 6.5–8.1)

## 2018-08-28 LAB — HEMOGLOBIN A1C
Hgb A1c MFr Bld: 5.8 % — ABNORMAL HIGH (ref 4.8–5.6)
Mean Plasma Glucose: 119.76 mg/dL

## 2018-08-29 LAB — ALPHA-1-ANTITRYPSIN: A-1 Antitrypsin, Ser: 121 mg/dL (ref 101–187)

## 2018-09-02 ENCOUNTER — Other Ambulatory Visit: Payer: Self-pay | Admitting: Internal Medicine

## 2018-09-02 DIAGNOSIS — R0609 Other forms of dyspnea: Secondary | ICD-10-CM

## 2018-09-02 DIAGNOSIS — R0600 Dyspnea, unspecified: Secondary | ICD-10-CM

## 2018-09-09 ENCOUNTER — Telehealth: Payer: Self-pay | Admitting: Pulmonary Disease

## 2018-09-09 NOTE — Telephone Encounter (Signed)

## 2018-09-10 ENCOUNTER — Other Ambulatory Visit: Payer: Self-pay

## 2018-09-10 ENCOUNTER — Ambulatory Visit
Admission: RE | Admit: 2018-09-10 | Discharge: 2018-09-10 | Disposition: A | Discharge status: Home / Self Care | Payer: Medicare Other | Source: Ambulatory Visit | Attending: Pulmonary Disease | Admitting: Pulmonary Disease

## 2018-09-10 ENCOUNTER — Ambulatory Visit (INDEPENDENT_AMBULATORY_CARE_PROVIDER_SITE_OTHER): Payer: Medicare Other | Admitting: Pulmonary Disease

## 2018-09-10 ENCOUNTER — Encounter: Payer: Self-pay | Admitting: Pulmonary Disease

## 2018-09-10 VITALS — BP 126/72 | HR 82 | Temp 97.4°F | Ht 69.0 in | Wt 254.4 lb

## 2018-09-10 DIAGNOSIS — R05 Cough: Secondary | ICD-10-CM

## 2018-09-10 DIAGNOSIS — R0602 Shortness of breath: Secondary | ICD-10-CM | POA: Diagnosis not present

## 2018-09-10 DIAGNOSIS — E669 Obesity, unspecified: Secondary | ICD-10-CM

## 2018-09-10 DIAGNOSIS — R059 Cough, unspecified: Secondary | ICD-10-CM

## 2018-09-10 DIAGNOSIS — R490 Dysphonia: Secondary | ICD-10-CM | POA: Diagnosis not present

## 2018-09-10 NOTE — Progress Notes (Signed)
Subjective:    Patient ID: Dana Burke, female    DOB: 07-Jun-1945, 73 y.o.   MRN: [id number]  HPI 73 year old remote former smoker (quit 1989) here for follow-up of wheezing, shortness of breath and hoarseness.  Patient was initially evaluated on 22 August 2018.  At her initial visit she had a chest x-ray that showed potential pneumonitis/bronchitis and she was treated with Biaxin.  She states that after Biaxin she was "normal" for 2 days and then her symptoms started again.  Most notably she has regained her issues with dysphonia.  At her initial visit we also recommended that she be reevaluated by ENT and the recommendation from ENT is that she is to be evaluated at the voice disorders center at [facility] for potential spasmodic dysphonia or laryngeal dystonia.  This appointment is pending.  She was also noted to have significant issues with laryngopharyngeal reflux by ENT examination with laryngoscope.  The patient notes that because of the hoarseness and the needed to buy to force her voice she becomes fatigued and also notices occasional shortness of breath.  I suspect that this is related to her upper airway issues.  She has not had any fevers or chills she has had night sweats but these are not new and have been a longstanding issue for her.  No cough or sputum production.  No hemoptysis.  No chest pain, orthopnea, paroxysmal nocturnal dyspnea nor lower extremity edema.   Review of Systems  Constitutional: Positive for fatigue. Negative for diaphoresis.  HENT: Positive for voice change (Dysphonia). Negative for congestion and postnasal drip.   Eyes: Negative.   Respiratory: Positive for wheezing (Upper airway).   Cardiovascular: Negative.   Gastrointestinal:       Reflux symptoms.  Endocrine: Negative.   Genitourinary: Negative.   Musculoskeletal: Negative.   Skin: Negative.   Allergic/Immunologic: Negative.   Neurological: Negative.   Hematological: Negative.    Psychiatric/Behavioral: Negative.   All other systems reviewed and are negative.      Objective:   Physical Exam Vitals signs and nursing note reviewed.  Constitutional:      General: She is not in acute distress.    Appearance: Normal appearance. She is obese. She is not ill-appearing.  HENT:     Head: Normocephalic and atraumatic.     Right Ear: External ear normal.     Left Ear: External ear normal.     Nose:     Comments: Nose/mouth/throat not examined due to masking requirements for COVID 19.    Mouth/Throat:     Comments: Hoarse. Eyes:     General: No scleral icterus.    Conjunctiva/sclera: Conjunctivae normal.     Pupils: Pupils are equal, round, and reactive to light.  Neck:     Musculoskeletal: Neck supple.     Thyroid: No thyromegaly.     Trachea: Trachea normal.     Comments: She is clearly dysphonic.  "Pseudo-wheeze" noted- limited to the upper airway Cardiovascular:     Rate and Rhythm: Normal rate and regular rhythm.     Pulses: Normal pulses.     Heart sounds: Normal heart sounds.  Pulmonary:     Effort: Pulmonary effort is normal.     Breath sounds: No wheezing (No true wheezes.).     Comments: Coarse breath sounds, good air entry bilaterally. Abdominal:     General: Abdomen is protuberant. There is no distension.  Musculoskeletal: Normal range of motion.     Right lower  leg: No edema.     Left lower leg: No edema.  Lymphadenopathy:     Cervical: No cervical adenopathy.  Skin:    General: Skin is warm and dry.  Neurological:     General: No focal deficit present.     Mental Status: She is alert and oriented to person, place, and time.  Psychiatric:        Mood and Affect: Mood normal.        Behavior: Behavior normal.    We did recheck her chest x-ray since she had had abnormalities noted on the first film taken on 7 August: No active disease noted on today's film.               Assessment & Plan:  1.  Hoarseness/dysphonia: Suspect this  may be due to vocal cord dysfunction, laryngopharyngeal reflux mediated, other possibilities include laryngeal dystonia and/or spasmodic dysphonia.  She has been referred to the voice disorders center at [facility].  She has an upcoming appointment with them.  I have encouraged her to keep this.  2.  Dyspnea/shortness of breath: I suspect that this is related to the issue above particularly she is having issues with reflux and vocal cord dysfunction this can give the sensation of dyspnea due to paradoxical vocal cord motion.    On her prior x-ray there was evidence of potential pneumonitis/bronchitis, today's x-ray is completely benign.  The patient states that she is feeling somewhat better in this regard.  Will procure pulmonary function testing to further delve into this issue.  3.  Gastroesophageal reflux with laryngopharyngeal component: Recommend antireflux measures and continue omeprazole.  Her dose was switched to ENT to 2 capsules (40 mg) at bedtime.    4.  Obesity class II: This issue adds complexity to her management.  Weight loss has been recommended.  This chart was dictated using voice recognition software/Dragon.  Despite best efforts to proofread, errors can occur which can change the meaning.  Any change was purely unintentional.

## 2018-09-10 NOTE — Patient Instructions (Addendum)
1.  Continue taking your omeprazole as instructed by Dr. Richardson Landry.  2.  We will recheck another chest x-ray today.  We will get breathing tests  3.  Agree with speech pathology evaluation.  4.  We will see you in follow-up in 3 to 4 weeks time.  Call sooner should any new difficulties arise.

## 2018-09-11 ENCOUNTER — Ambulatory Visit (INDEPENDENT_AMBULATORY_CARE_PROVIDER_SITE_OTHER): Payer: Medicare Other

## 2018-09-11 DIAGNOSIS — R0609 Other forms of dyspnea: Secondary | ICD-10-CM

## 2018-09-11 MED ORDER — PERFLUTREN LIPID MICROSPHERE
1.0000 mL | INTRAVENOUS | Status: AC | PRN
  Administered 2018-09-11: 2 mL via INTRAVENOUS

## 2018-09-12 ENCOUNTER — Telehealth: Payer: Self-pay | Admitting: *Deleted

## 2018-09-12 MED ORDER — ESOMEPRAZOLE MAGNESIUM 40 MG PO CPDR: 40.0000 mg | DELAYED_RELEASE_CAPSULE | Freq: Every day | ORAL | 0 refills | Status: DC

## 2018-09-12 NOTE — Telephone Encounter (Signed)
Copied from [place] [id number]. Topic: General - Inquiry >> Sep 12, 2018 11:19 AM Reyne Dumas L wrote: Reason for CRM:   Pt calling to see if her lab results are back yet. Pt also wants to know if it is okay for her to take Protonix or Nexium. Pt can be reached at [phone]

## 2018-09-12 NOTE — Telephone Encounter (Signed)
Spoke with pt and she is wanting to see if her echocardiogram results are back yet. Pt stated that it was done yesterday and she had a repeat chest xray on Wednesday. Pt also stated that one of her doctors told her that she had been taking omeprazole for 20 years and that it might be a good idea for her to change to either protonix or nexium. Pt is wanting to know which one you would like for her to start taking.

## 2018-09-12 NOTE — Telephone Encounter (Signed)
I do not have the results yet.  I have no preference which PPI she switches to,  But protnonix is by rx only,  .and nexium is available OTC>   I don't know why they told her that.

## 2018-09-15 NOTE — Telephone Encounter (Signed)
Spoke with pt and she stated that Dr. Derrel Nip called her Friday evening with the results of the test and sent in the nexium prescription.

## 2018-09-17 ENCOUNTER — Other Ambulatory Visit: Payer: Self-pay

## 2018-09-17 ENCOUNTER — Telehealth: Payer: Self-pay | Admitting: Pulmonary Disease

## 2018-09-17 ENCOUNTER — Ambulatory Visit: Payer: Medicare Other | Attending: Otolaryngology | Admitting: Speech Pathology

## 2018-09-17 DIAGNOSIS — R490 Dysphonia: Secondary | ICD-10-CM | POA: Diagnosis not present

## 2018-09-17 NOTE — Telephone Encounter (Signed)
Per Dr. Patsey Berthold verbally- CXR performed on 09/10/2018 is normal. Pt is aware of results and voiced her understanding.  Nothing further is needed at this time.

## 2018-09-18 ENCOUNTER — Encounter: Payer: Self-pay | Admitting: Speech Pathology

## 2018-09-18 ENCOUNTER — Other Ambulatory Visit: Payer: Self-pay

## 2018-09-18 NOTE — Therapy (Signed)
[facility] MAIN [facility] SERVICES [address] [place], [place], [address] Phone: [phone]   Fax:  [phone]  Speech Language Pathology Evaluation  Patient Details  Name: Dana Burke MRN: [id number] Date of Birth: 12/15/1945 Referring Provider (SLP): Dr. Willeen Cass   Encounter Date: 09/17/2018  End of Session - 09/18/18 0942    Visit Number  1    Number of Visits  17    Date for SLP Re-Evaluation  11/12/18    Authorization Type  Medicare    Authorization Time Period  Start 09/17/2018    Authorization - Visit Number  1    Authorization - Number of Visits  10    SLP Start Time  0900    SLP Stop Time   0950    SLP Time Calculation (min)  50 min    Activity Tolerance  Patient tolerated treatment well       Past Medical History:  Diagnosis Date  . Depression    recent events  . GERD (gastroesophageal reflux disease)   . Otitis media with rupture of tympanic membrane, left 02/13/2018  . Phlebitis and thrombophlebitis of the leg   . Pulmonary embolism (HCC)   . 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 APPENDECTOMY  1963  . CHOLECYSTECTOMY  1996  . FRACTURE SURGERY  2001   left tibia  . THYROGLOSSAL DUCT CYST N/A 06/22/2015   Procedure: THYROGLOSSAL DUCT CYST;  Surgeon: Geanie Logan, MD;  Location: [facility] ORS;  Service: ENT;  Laterality: N/A;    There were no vitals filed for this visit.      SLP Evaluation [facility] - 09/18/18 0001      SLP Visit Information   SLP Received On  09/17/18    Referring Provider (SLP)  Dr. Willeen Cass    Onset Date  07/19/2018    Medical Diagnosis  Dysphonia      Subjective   Subjective   "It's aggravating!"    Patient/Family Stated Goal  Clear/normal vocal quality      General Information   HPI  Dana Burke is a 73 year old woman referred for voice therapy secondary abrupt onset of dysphonia.  Dr. Talmage Nap laryngeal findings include "TVC are clear and  mobile with phonation and slight spasming with adduction.  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 TVC with minimal edema and no nodules or polyps."      Prior Functional Status   Cognitive/Linguistic Baseline  Within functional limits      Oral Motor/Sensory Function   Overall Oral Motor/Sensory Function  Appears within functional limits for tasks assessed      Motor Speech   Overall Motor Speech  Impaired    Respiration  Impaired    Level of Impairment  Word    Phonation  Hoarse;Other (comment)   moments of aphonia   Resonance  Within functional limits    Articulation  Within functional limitis    Intelligibility  Intelligible    Phonation  Impaired    Vocal Abuses  Habitual Hyperphonia;Vocal Fold Dehydration    Tension Present  Jaw;Neck;Shoulder    Volume  Appropriate    Pitch  High      Standardized Assessments   Standardized Assessments   Other Assessment   Perceptual Voice Evaluation     Perceptual Voice Evaluation Voice checklist: . Health risks: GERD, allergies, excess caffeine intake, reduced water intake, frequent URIs  . Characteristic voice use: patient  is retired and lives alone, she reports no excessive talking . Environmental risks: no significant environmental risks . Misuse/Abuse: speaking without good breath support, speaking with strain and tension . Vocal characteristics: hoarseness, strained vocal quality, poor vocal projection, excessive pharyngeal resonance, and speaking with inadequate breath support.  Maximum phonation time for sustained "ah": 5 seconds Average fundamental frequency during sustained "ah": 294 Hz (1.8 STD above average for gender) Habitual pitch: 264 Hz Highest dynamic pitch when altering pitch from a low note to a high note: 530 Hz Lowest dynamic pitch when altering from a high note to a low note: 260 Hz Highest dynamic pitch in conversational speech: 318 Hz Lowest dynamic pitch in conversational speech: 187  Hz Average time patient was able to sustain /s/: 7 seconds Average time patient was able to sustain /z/: not able to sustain voiced fricative s/z ratio : N/A Visi-Pitch: Multi-Dimensional Voice Program (MDVP)  MDVPT extracts objective quantitative values (Relative Average Perturbation, Shimmer, Voice Turbulence Index, and Noise to Harmonic Ratio) on sustained phonation, which are displayed graphically and numerically in comparison to a built-in normative database.  The patient exhibited values outside the norm for Relative Average Perturbation, Shimmer, and Noise to Harmonic Ratio.  Average fundamental frequency was 1.8 STD above average for age and gender.  Patient able to improve all parameters with model of oral resonance and breath support.  SLP Education - 09/18/18 0941    Education Details  Results and recommendations    Person(s) Educated  Patient    Methods  Explanation    Comprehension  Verbalized understanding         SLP Long Term Goals - 09/18/18 0945      SLP LONG TERM GOAL #1   Title  The patient will demonstrate independent understanding of vocal hygiene concepts and extrinsic laryngeal muscle stretches.    Time  8    Period  Weeks    Status  New    Target Date  11/12/18      SLP LONG TERM GOAL #2   Title  The patient will be independent for abdominal breathing and breath support exercises.    Time  8    Period  Weeks    Status  New    Target Date  11/12/18      SLP LONG TERM GOAL #3   Title  The patient will minimize vocal tension via resonant voice therapy (or comparable technique) with min SLP cues with 80% accuracy.    Time  8    Period  Weeks    Status  New    Target Date  11/12/18      SLP LONG TERM GOAL #4   Title  The patient will maintain relaxed phonation / oral resonance for paragraph length recitation with 80% accuracy.    Time  8    Period  Weeks    Status  New    Target Date  11/12/18       Plan - 09/18/18 0943    Clinical Impression  Statement  This 73 year old woman under the care of Dr. 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  The patient demonstrates hoarse vocal quality, reduced breath control for speech, excessive pharyngeal resonance, strained/tense phonation, vocal fatigue, and extrinsic/intrinsic laryngeal tension. She will benefit from voice therapy for education, to improve breath support, improve tone focus, promote easy flow phonation, and learn techniques to use her voice better and with less  effort.    Speech Therapy Frequency  2x / week    Duration  Other (comment)   8 weeks   Treatment/Interventions  SLP instruction and feedback;Other (comment);Patient/family education   Voice therapy      Patient will benefit from skilled therapeutic intervention in order to improve the following deficits and impairments:   Dysphonia - Plan: SLP plan of care cert/re-cert    Problem List Patient Active Problem List   Diagnosis Date Noted  . Dyspnea and respiratory abnormalities 08/13/2018  . Prediabetes 02/13/2018  . Encounter for preventive health examination 12/04/2015  . Insomnia 12/04/2015  . Neuropathy 06/02/2014  . Major depressive disorder, single episode 05/08/2014  . Screening for osteoporosis 05/08/2014  . History of DVT of lower extremity 05/05/2014  . Hyperlipidemia 05/05/2013  . Medicare annual wellness visit, subsequent 05/03/2013  . Special screening for malignant neoplasms, colon 09/04/2012  . Obesity 07/31/2012  . History of abnormal mammogram 07/31/2012  . Family history of malignant melanoma 07/31/2012  . GERD (gastroesophageal reflux disease)    Dana Primrose, MS/CCC- SLP  Leandrew Koyanagi 09/18/2018, 9:51 AM  [place] [facility] MAIN [facility] SERVICES [address] [place], [place], [address] Phone: [phone]   Fax:  [phone]  Name: Dana Burke MRN:  [id number] Date of Birth: 10/05/1945

## 2018-09-24 ENCOUNTER — Ambulatory Visit: Payer: Medicare Other | Admitting: Speech Pathology

## 2018-09-24 ENCOUNTER — Other Ambulatory Visit: Payer: Self-pay

## 2018-09-24 ENCOUNTER — Encounter: Payer: Self-pay | Admitting: Speech Pathology

## 2018-09-24 DIAGNOSIS — K219 Gastro-esophageal reflux disease without esophagitis: Secondary | ICD-10-CM | POA: Diagnosis not present

## 2018-09-24 DIAGNOSIS — R490 Dysphonia: Secondary | ICD-10-CM

## 2018-09-24 NOTE — Therapy (Signed)
[place] MAIN [facility] SERVICES [address] [place], [place], [address] Phone: [phone]   Fax:  [phone]  Speech Language Pathology Evaluation  Patient Details  Name: Dana Burke MRN: [id number] Date of Birth: 29-Apr-1945 Referring Provider (SLP): Dr. Richardson Landry   Encounter Date: 09/24/2018  End of Session - 09/24/18 1122    Visit Number  2    Number of Visits  17    Date for SLP Re-Evaluation  11/12/18    Authorization Type  Medicare    Authorization Time Period  Start 09/17/2018    Authorization - Visit Number  2    Authorization - Number of Visits  10    SLP Start Time  0900    SLP Stop Time   0950    SLP Time Calculation (min)  50 min    Activity Tolerance  Patient tolerated treatment well       Past Medical History:  Diagnosis Date  . Depression    recent events  . GERD (gastroesophageal reflux disease)   . Otitis media with rupture of tympanic membrane, left 02/13/2018  . Phlebitis and thrombophlebitis of the leg   . Pulmonary embolism ([place])   . 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 APPENDECTOMY  1963  . CHOLECYSTECTOMY  1996  . FRACTURE SURGERY  2001   left tibia  . THYROGLOSSAL DUCT CYST N/A 06/22/2015   Procedure: THYROGLOSSAL DUCT CYST;  Surgeon: Clyde Canterbury, MD;  Location: [facility] ORS;  Service: ENT;  Laterality: N/A;    There were no vitals filed for this visit.  Subjective Assessment - 09/24/18 1121    Subjective  The patient is EAGER to improve vocal quality         SLP Evaluation [facility] - 09/24/18 0001      General Information   HPI  Dana Burke is a 73 year old woman referred for voice therapy secondary abrupt onset of dysphonia.  Dr. Reola Mosher laryngeal findings include "TVC are clear and mobile with phonation and slight spasming with adduction.  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 TVC with minimal edema and no nodules or polyps."       Plan   Duration  Other (comment)   8 weeks                  ADULT SLP TREATMENT - 09/24/18 0001      General Information   Behavior/Cognition  Alert;Cooperative;Pleasant mood    HPI  Exercise to Release Throat Tension      Treatment Provided   Treatment provided  Cognitive-Linquistic      Pain Assessment   Pain Assessment  No/denies pain      Cognitive-Linquistic Treatment   Treatment focused on  Voice    Skilled Treatment  Patient given written/verbal/demonstration education RE: exercises to promote relaxed phonation with breath support to promote increased airflow to get louder.  Exercises include massage, abdominal breathing, trills, voice fricative, blowing, semi-occluded phonation, posture exercises, and breath support exercises.  The patient finds the posture exercises with relaxed shoulders to be the most challenging.      Assessment / Recommendations / Plan   Plan  Continue with current plan of care      Progression Toward Goals   Progression toward goals  Progressing toward goals        SLP Education - 09/24/18 1122    Education Details  Voice exercises  Person(s) Educated  Patient    Methods  Explanation;Demonstration;Handout    Comprehension  Verbalized understanding;Need further instruction         SLP Long Term Goals - 09/18/18 0945      SLP LONG TERM GOAL #1   Title  The patient will demonstrate independent understanding of vocal hygiene concepts and extrinsic laryngeal muscle stretches.    Time  8    Period  Weeks    Status  New    Target Date  11/12/18      SLP LONG TERM GOAL #2   Title  The patient will be independent for abdominal breathing and breath support exercises.    Time  8    Period  Weeks    Status  New    Target Date  11/12/18      SLP LONG TERM GOAL #3   Title  The patient will minimize vocal tension via resonant voice therapy (or comparable technique) with min SLP cues with 80% accuracy.    Time  8    Period   Weeks    Status  New    Target Date  11/12/18      SLP LONG TERM GOAL #4   Title  The patient will maintain relaxed phonation / oral resonance for paragraph length recitation with 80% accuracy.    Time  8    Period  Weeks    Status  New    Target Date  11/12/18       Plan - 09/24/18 1123    Clinical Impression Statement  Patient given written/verbal/demonstration education RE: exercises to promote relaxed phonation with breath support to promote increased airflow to get louder.  Exercises include massage, abdominal breathing, trills, voice fricative, blowing, semi-occluded phonation, posture exercises, and breath support exercises.  The patient finds the posture exercises with relaxed shoulders to be the most challenging.    Speech Therapy Frequency  2x / week    Duration  Other (comment)    Treatment/Interventions  SLP instruction and feedback;Other (comment);Patient/family education    Potential to Achieve Goals  Good    Potential Considerations  Ability to learn/carryover information;Pain level;Family/community support;Co-morbidities;Previous level of function;Cooperation/participation level;Severity of impairments    SLP Home Exercise Plan  Provided    Consulted and Agree with Plan of Care  Patient       Patient will benefit from skilled therapeutic intervention in order to improve the following deficits and impairments:   Dysphonia    Problem List Patient Active Problem List   Diagnosis Date Noted  . Dyspnea and respiratory abnormalities 08/13/2018  . Prediabetes 02/13/2018  . Encounter for preventive health examination 12/04/2015  . Insomnia 12/04/2015  . Neuropathy 06/02/2014  . Major depressive disorder, single episode 05/08/2014  . Screening for osteoporosis 05/08/2014  . History of DVT of lower extremity 05/05/2014  . Hyperlipidemia 05/05/2013  . Medicare annual wellness visit, subsequent 05/03/2013  . Special screening for malignant neoplasms, colon 09/04/2012   . Obesity 07/31/2012  . History of abnormal mammogram 07/31/2012  . Family history of malignant melanoma 07/31/2012  . GERD (gastroesophageal reflux disease)     Dana Sea, MS/CCC- SLP  Dana Burke 09/24/2018, 11:25 AM  [place] MAIN [facility] SERVICES [address] [place], [place], [address] Phone: [phone]   Fax:  [phone]  Name: Dana Burke MRN: [id number] Date of Birth: 11-04-1945

## 2018-09-25 ENCOUNTER — Other Ambulatory Visit: Payer: Self-pay | Admitting: Internal Medicine

## 2018-09-26 ENCOUNTER — Other Ambulatory Visit: Payer: Self-pay

## 2018-09-26 ENCOUNTER — Ambulatory Visit: Payer: Medicare Other | Admitting: Speech Pathology

## 2018-09-26 DIAGNOSIS — R490 Dysphonia: Secondary | ICD-10-CM

## 2018-09-26 DIAGNOSIS — Z23 Encounter for immunization: Secondary | ICD-10-CM | POA: Diagnosis not present

## 2018-09-26 NOTE — Therapy (Signed)
[place] MAIN [facility] SERVICES [address] [place], [place], [address] Phone: [phone]   Fax:  [phone]  Patient Details  Name: Dana Burke MRN: [id number] Date of Birth: [birth year] Referring Provider:  Clyde Canterbury, MD  Encounter Date: 09/26/2018   Speech Therapy Cancellation Note  The patient came for her scheduled appointment.  She tells me that she has been referred to the [facility] and her appointment is 10/07/2018.  I assured her that this is an excellent referral as the [facility] voice team is super.  She has opted to discontinue speech therapy here until she has her appointment and decide what to do based on those recommendations.  Leroy Sea, MS/CCC- SLP  Lou Miner 09/26/2018, 9:44 AM  [place] MAIN [facility] SERVICES [address] [place], [place], [address] Phone: [phone]   Fax:  [phone]

## 2018-09-29 ENCOUNTER — Ambulatory Visit: Payer: Medicare Other | Admitting: Speech Pathology

## 2018-10-01 ENCOUNTER — Ambulatory Visit: Payer: Medicare Other | Admitting: Speech Pathology

## 2018-10-03 ENCOUNTER — Ambulatory Visit: Payer: Medicare Other | Admitting: Speech Pathology

## 2018-10-07 ENCOUNTER — Ambulatory Visit: Payer: Medicare Other | Admitting: Speech Pathology

## 2018-10-07 DIAGNOSIS — R490 Dysphonia: Secondary | ICD-10-CM | POA: Diagnosis not present

## 2018-10-07 DIAGNOSIS — R12 Heartburn: Secondary | ICD-10-CM | POA: Diagnosis not present

## 2018-10-08 ENCOUNTER — Ambulatory Visit: Payer: Medicare Other | Admitting: Speech Pathology

## 2018-10-10 ENCOUNTER — Other Ambulatory Visit: Payer: Self-pay | Admitting: Internal Medicine

## 2018-10-10 ENCOUNTER — Ambulatory Visit: Payer: Medicare Other | Admitting: Pulmonary Disease

## 2018-10-10 ENCOUNTER — Ambulatory Visit: Payer: Medicare Other | Admitting: Speech Pathology

## 2018-10-10 DIAGNOSIS — Z1231 Encounter for screening mammogram for malignant neoplasm of breast: Secondary | ICD-10-CM

## 2018-10-14 ENCOUNTER — Ambulatory Visit: Payer: Medicare Other | Admitting: Speech Pathology

## 2018-10-14 ENCOUNTER — Encounter: Payer: Self-pay | Admitting: Pulmonary Disease

## 2018-10-14 ENCOUNTER — Other Ambulatory Visit: Payer: Self-pay

## 2018-10-14 ENCOUNTER — Ambulatory Visit (INDEPENDENT_AMBULATORY_CARE_PROVIDER_SITE_OTHER): Payer: Medicare Other | Admitting: Pulmonary Disease

## 2018-10-14 VITALS — BP 134/76 | HR 84 | Temp 98.3°F | Ht 69.0 in | Wt 253.0 lb

## 2018-10-14 DIAGNOSIS — J383 Other diseases of vocal cords: Secondary | ICD-10-CM

## 2018-10-14 DIAGNOSIS — R0602 Shortness of breath: Secondary | ICD-10-CM | POA: Diagnosis not present

## 2018-10-14 DIAGNOSIS — E669 Obesity, unspecified: Secondary | ICD-10-CM | POA: Diagnosis not present

## 2018-10-14 DIAGNOSIS — K219 Gastro-esophageal reflux disease without esophagitis: Secondary | ICD-10-CM

## 2018-10-14 NOTE — Progress Notes (Signed)
Subjective:    Patient ID: Dana Burke, female    DOB: 04/19/1945, 73 y.o.   MRN: [id number]  HPI Tyanne is a 73 year old remote former smoker who presents for follow-up of dyspnea and dysphonia.  She has been evaluated at [facility] speech pathology/voice disorders center.  She has been diagnosed with spasmodic dysphonia.  She is being evaluated in this regard by Dr. Unice Bailey.  Since her last visit she has noted that her dyspnea is less marked particularly after undergoing some speech therapy at [facility].  She has not noticed any change on her dysphonia, however.  Her gastroesophageal reflux symptoms are well controlled with her current dose of omeprazole.  She does not endorse any fevers, chills or sweats she voices no other complaints.  Overall she looks well and feels well with the exception of the issues above.  She is up-to-date on flu vaccine.   Review of Systems  Constitutional: Negative for fatigue.  HENT: Positive for voice change (Dysphonia).   Eyes: Negative.   Respiratory: Positive for shortness of breath (Improved) and wheezing (Occasional, upper airway).   Cardiovascular: Negative.   Gastrointestinal:       Reflux symptoms-improved.  Endocrine: Negative.   Genitourinary: Negative.   Musculoskeletal: Negative.   Skin: Negative.   Allergic/Immunologic: Negative.   Neurological: Negative.   Hematological: Negative.   Psychiatric/Behavioral: Negative.   All other systems reviewed and are negative.      Objective:   Physical Exam Vitals signs and nursing note reviewed.  Constitutional:      General: She is not in acute distress.    Appearance: Normal appearance. She is obese. She is not ill-appearing.  HENT:     Head: Normocephalic and atraumatic.     Right Ear: External ear normal.     Left Ear: External ear normal.     Nose:     Comments: Nose/mouth/throat not examined due to masking requirements for COVID 19.    Mouth/Throat:     Comments: Dysphonia Eyes:      General: No scleral icterus.    Conjunctiva/sclera: Conjunctivae normal.     Pupils: Pupils are equal, round, and reactive to light.  Neck:     Musculoskeletal: Neck supple.     Thyroid: No thyromegaly.     Trachea: Trachea normal.     Comments: She is clearly dysphonic.  Cardiovascular:     Rate and Rhythm: Normal rate and regular rhythm.     Pulses: Normal pulses.     Heart sounds: Normal heart sounds.  Pulmonary:     Effort: Pulmonary effort is normal.     Breath sounds: No wheezing (No true wheezes.).     Comments: Coarse breath sounds, good air entry bilaterally. Abdominal:     General: Abdomen is protuberant. There is no distension.  Musculoskeletal: Normal range of motion.     Right lower leg: No edema.     Left lower leg: No edema.  Lymphadenopathy:     Cervical: No cervical adenopathy.  Skin:    General: Skin is warm and dry.  Neurological:     General: No focal deficit present.     Mental Status: She is alert and oriented to person, place, and time.  Psychiatric:        Mood and Affect: Mood normal.        Behavior: Behavior normal.           Assessment & Plan:   1.  Spasmodic dysphonia: Continue follow-up  with [facility] in this regard.  Per Dr. Beverely Risen, she may need Botox injections or vocal cord stabilization therapy.  2.  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  PFTs pending however, clinically she does not have any evidence of intrinsic lung disease.  3.  Gastroesophageal reflux disease: She appears to be well controlled with omeprazole 40 mg at bedtime.  Continue the same.  4.  Obesity class II: This issue adds complexity to her management.  As noted above she is up-to-date on flu vaccine.  She has been encouraged to continue follow-up at [facility] with regards to her dysphonia.  Follow-up will be in 6 months time  she is to contact us prior to that time should any new difficulties arise.

## 2018-10-14 NOTE — Patient Instructions (Signed)
We will see you in follow-up in 6 months time please call sooner should any new difficulties arise.

## 2018-10-15 ENCOUNTER — Ambulatory Visit: Payer: Medicare Other | Admitting: Pulmonary Disease

## 2018-10-17 ENCOUNTER — Other Ambulatory Visit: Payer: Self-pay | Admitting: Internal Medicine

## 2018-10-17 ENCOUNTER — Ambulatory Visit: Payer: Medicare Other | Admitting: Speech Pathology

## 2018-10-22 DIAGNOSIS — G4733 Obstructive sleep apnea (adult) (pediatric): Secondary | ICD-10-CM | POA: Diagnosis not present

## 2018-10-22 DIAGNOSIS — R0602 Shortness of breath: Secondary | ICD-10-CM | POA: Diagnosis not present

## 2018-10-24 DIAGNOSIS — R490 Dysphonia: Secondary | ICD-10-CM | POA: Diagnosis not present

## 2018-11-10 ENCOUNTER — Other Ambulatory Visit: Payer: Self-pay | Admitting: Internal Medicine

## 2018-11-12 DIAGNOSIS — R490 Dysphonia: Secondary | ICD-10-CM | POA: Diagnosis not present

## 2018-11-21 ENCOUNTER — Other Ambulatory Visit: Payer: Self-pay | Admitting: Internal Medicine

## 2018-11-24 DIAGNOSIS — H02834 Dermatochalasis of left upper eyelid: Secondary | ICD-10-CM | POA: Diagnosis not present

## 2018-11-24 DIAGNOSIS — H43393 Other vitreous opacities, bilateral: Secondary | ICD-10-CM | POA: Diagnosis not present

## 2018-11-24 DIAGNOSIS — H524 Presbyopia: Secondary | ICD-10-CM | POA: Diagnosis not present

## 2018-11-24 DIAGNOSIS — H02831 Dermatochalasis of right upper eyelid: Secondary | ICD-10-CM | POA: Diagnosis not present

## 2018-12-03 DIAGNOSIS — R490 Dysphonia: Secondary | ICD-10-CM | POA: Diagnosis not present

## 2018-12-24 ENCOUNTER — Other Ambulatory Visit: Payer: Self-pay | Admitting: Internal Medicine

## 2018-12-25 ENCOUNTER — Other Ambulatory Visit: Payer: Self-pay

## 2018-12-25 ENCOUNTER — Ambulatory Visit
Admission: RE | Admit: 2018-12-25 | Discharge: 2018-12-25 | Disposition: A | Discharge status: Home / Self Care | Payer: Medicare Other | Source: Ambulatory Visit | Attending: Internal Medicine | Admitting: Internal Medicine

## 2018-12-25 DIAGNOSIS — Z1231 Encounter for screening mammogram for malignant neoplasm of breast: Secondary | ICD-10-CM | POA: Diagnosis not present

## 2019-01-02 ENCOUNTER — Other Ambulatory Visit: Payer: Self-pay | Admitting: Internal Medicine

## 2019-01-28 ENCOUNTER — Other Ambulatory Visit: Payer: Self-pay | Admitting: Internal Medicine

## 2019-01-29 ENCOUNTER — Encounter (INDEPENDENT_AMBULATORY_CARE_PROVIDER_SITE_OTHER): Payer: Self-pay

## 2019-01-29 ENCOUNTER — Ambulatory Visit (INDEPENDENT_AMBULATORY_CARE_PROVIDER_SITE_OTHER): Payer: Medicare Other

## 2019-01-29 ENCOUNTER — Other Ambulatory Visit: Payer: Self-pay

## 2019-01-29 VITALS — Ht 69.0 in | Wt 253.0 lb

## 2019-01-29 DIAGNOSIS — Z0000 Encounter for general adult medical examination without abnormal findings: Secondary | ICD-10-CM

## 2019-01-29 NOTE — Progress Notes (Signed)
Subjective:   Dana Burke is a 74 y.o. female who presents for Medicare Annual (Subsequent) preventive examination.  Review of Systems:  No ROS.  Medicare Wellness Virtual Visit.  Visual/audio telehealth visit, UTA vital signs.  Wt/Ht provided.  See social history for additional risk factors.  Cardiac Risk Factors include: advanced age (>34men, >72 women)     Objective:     Vitals: Ht 5\' 9"  (1.753 m)   Wt 253 lb (114.8 kg)   BMI 37.36 kg/m   Body mass index is 37.36 kg/m.  Advanced Directives 01/29/2019 09/18/2018 04/23/2017 03/19/2016 06/22/2015 06/20/2015  Does Patient Have a Medical Advance Directive? Yes No Yes Yes Yes Yes  Type of Paramedic of [place];Living will - [place];Living will [place];Living will [place];Living will [place];Living will  Does patient want to make changes to medical advance directive? No - Patient declined - No - Patient declined No - Patient declined No - Patient declined -  Copy of [place] in Chart? No - copy requested - No - copy requested No - copy requested No - copy requested No - copy requested    Tobacco Social History   Tobacco Use  Smoking Status Former Smoker  . Packs/day: 0.40  . Years: 2.00  . Pack years: 0.80  . Types: Cigarettes  . Quit date: 07/30/1987  . Years since quitting: 31.5  Smokeless Tobacco Never Used     Counseling given: Not Answered   Clinical Intake:  Pre-visit preparation completed: Yes        Diabetes: No  How often do you need to have someone help you when you read instructions, pamphlets, or other written materials from your doctor or pharmacy?: 1 - Never  Interpreter Needed?: No     Past Medical History:  Diagnosis Date  . Depression    recent events  . GERD (gastroesophageal reflux disease)   . Otitis media with rupture of tympanic membrane, left 02/13/2018  .  Phlebitis and thrombophlebitis of the leg   . Pulmonary embolism ([place])   . 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 APPENDECTOMY  1963  . CHOLECYSTECTOMY  1996  . FRACTURE SURGERY  2001   left tibia  . THYROGLOSSAL DUCT CYST N/A 06/22/2015   Procedure: THYROGLOSSAL DUCT CYST;  Surgeon: Clyde Canterbury, MD;  Location: [facility] ORS;  Service: ENT;  Laterality: N/A;   Family History  Problem Relation Age of Onset  . Heart disease Mother   . Hyperlipidemia Mother   . Dementia Mother   . Heart disease Father   . Hyperlipidemia Father   . Diabetes Father   . Cancer Sister        melanoma  . Cancer Sister        breast  . Breast cancer Sister   . Cancer Brother        skin cancer  . Bladder Cancer Brother   . Diabetes Brother   . Cancer Paternal Uncle        colon  . Breast cancer Cousin   . Breast cancer Cousin   . Breast cancer Cousin   . Breast cancer Cousin   . Breast cancer Cousin   . Seizures Grandchild   . Diabetes Brother   . Diabetes Brother    Social History   Socioeconomic History  . Marital status: Widowed    Spouse name:  Not on file  . Number of children: Not on file  . Years of education: Not on file  . Highest education level: Not on file  Occupational History  . Not on file  Tobacco Use  . Smoking status: Former Smoker    Packs/day: 0.40    Years: 2.00    Pack years: 0.80    Types: Cigarettes    Quit date: 07/30/1987    Years since quitting: 31.5  . Smokeless tobacco: Never Used  Substance and Sexual Activity  . Alcohol use: No  . Drug use: No  . Sexual activity: Never  Other Topics Concern  . Not on file  Social History Narrative  . Not on file   Social Determinants of Health   Financial Resource Strain: Low Risk   . Difficulty of Paying Living Expenses: Not hard at all  Food Insecurity: No Food Insecurity  . Worried About Charity fundraiser in the Last Year: Never true  . Ran Out of Food in the Last  Year: Never true  Transportation Needs: No Transportation Needs  . Lack of Transportation (Medical): No  . Lack of Transportation (Non-Medical): No  Physical Activity:   . Days of Exercise per Week: Not on file  . Minutes of Exercise per Session: Not on file  Stress: No Stress Concern Present  . Feeling of Stress : Not at all  Social Connections: Unknown  . Frequency of Communication with Friends and Family: More than three times a week  . Frequency of Social Gatherings with Friends and Family: More than three times a week  . Attends Religious Services: 1 to 4 times per year  . Active Member of Clubs or Organizations: Yes  . Attends Archivist Meetings: Not on file  . Marital Status: Not on file    Outpatient Encounter Medications as of 01/29/2019  Medication Sig  . albuterol (VENTOLIN HFA) 108 (90 Base) MCG/ACT inhaler Inhale into the lungs every 6 (six) hours as needed for wheezing or shortness of breath.  . benzonatate (TESSALON) 100 MG capsule Take 1 capsule (100 mg total) by mouth 3 (three) times daily as needed for cough.  [name] buPROPion (WELLBUTRIN XL) 300 MG 24 hr tablet TAKE 1 TABLET BY MOUTH ONCE DAILY  . calcium carbonate (OS-CAL) 600 MG TABS Take 600 mg by mouth 2 (two) times daily with a meal.  . cetirizine (ZYRTEC) 10 MG tablet Take 10 mg by mouth at bedtime.   . Cholecalciferol (VITAMIN D-3) 1000 UNITS CAPS Take 1 capsule by mouth daily.  [name] Sodium (COLACE PO) Take 1 capsule by mouth daily.  [name] escitalopram (LEXAPRO) 10 MG tablet TAKE 1 TABLET BY MOUTH ONCE DAILY  . esomeprazole (NEXIUM) 40 MG capsule TAKE 1 CAPSULE BY MOUTH ONCE DAILY AT 12NOON  . gabapentin (NEURONTIN) 300 MG capsule TAKE 1 CAPSULE BY MOUTH 4 TIMES DAILY  . Melatonin 1 MG TABS Take 1 tablet by mouth.  . Multiple Vitamins-Minerals (MULTIVITAMIN WITH MINERALS) tablet Take 1 tablet by mouth daily.  . simvastatin (ZOCOR) 20 MG tablet TAKE 1 TABLET BY MOUTH AT BEDTIME   No  facility-administered encounter medications on file as of 01/29/2019.    Activities of Daily Living In your present state of health, do you have any difficulty performing the following activities: 01/29/2019  Hearing? N  Vision? N  Difficulty concentrating or making decisions? N  Walking or climbing stairs? N  Dressing or bathing? N  Doing errands, shopping? N  Preparing  Food and eating ? N  Using the Toilet? N  In the past six months, have you accidently leaked urine? N  Do you have problems with loss of bowel control? N  Managing your Medications? N  Managing your Finances? N  Housekeeping or managing your Housekeeping? N  Some recent data might be hidden    Patient Care Team: Crecencio Mc, MD as PCP - General (Internal Medicine) Bary Castilla, Forest Gleason, MD (General Surgery) Crecencio Mc, MD (Internal Medicine)    Assessment:   This is a routine wellness examination for Dana Burke.  Nurse connected with patient 01/29/19 at 11:30 AM EST by a telephone enabled telemedicine application and verified that I am speaking with the correct person using two identifiers. Patient stated full name and DOB. Patient gave permission to continue with virtual visit. Patient's location was at home and Nurse's location was at [place] office.   Patient is alert and oriented x3. Patient denies difficulty focusing or concentrating. Patient likes to cross stitch, completes crossword and word search puzzles for brain stimulation.   Health Maintenance Due: See completed HM at the end of note.   Eye: Visual acuity not assessed. Virtual visit. Followed by their ophthalmologist.  Dental: Visits every 6 months.    Hearing: Demonstrates normal hearing during visit.  Safety:  Patient feels safe at home- yes Patient does have smoke detectors at home- yes Patient does wear sunscreen or protective clothing when in direct sunlight - yes Patient does wear seat belt when in a moving vehicle - yes Patient  drives- yes Adequate lighting in walkways free from debris- yes Grab bars and handrails used as appropriate- yes Ambulates with an assistive device- no Cell phone on person when ambulating outside of the home- yes  Social: Alcohol intake - no   Smoking history- former   Smokers in home? none Illicit drug use? none  Medication: Taking as directed and without issues.  Self managed - yes   Covid-19: Precautions and sickness symptoms discussed. Wears mask, social distancing, hand hygiene as appropriate.   Activities of Daily Living Patient denies needing assistance with: household chores, feeding themselves, getting from bed to chair, getting to the toilet, bathing/showering, dressing, managing money, or preparing meals.   Discussed the importance of a healthy diet, water intake and the benefits of aerobic exercise.   Physical activity- no routine. Encouraged to stay active.   Diet:  Regular Water: fair intake  Caffeine: 1 cup of coffee  Other Providers Patient Care Team: Crecencio Mc, MD as PCP - General (Internal Medicine) Bary Castilla, Forest Gleason, MD (General Surgery) Crecencio Mc, MD (Internal Medicine) Exercise Activities and Dietary recommendations Current Exercise Habits: The patient does not participate in regular exercise at present  Goals    . Increase physical activity     Walk for exercise 3 times weekly, 30 minutes       Fall Risk Fall Risk  01/29/2019 08/13/2018 04/23/2017 03/19/2016 05/08/2014  Falls in the past year? 0 0 No No No  Follow up Falls prevention discussed - - - -   Timed Get Up and Go performed: no, virtual visit  Depression Screen PHQ 2/9 Scores 01/29/2019 08/13/2018 07/24/2018 04/23/2017  PHQ - 2 Score 0 0 0 0  PHQ- 9 Score - 4 0 -     Cognitive Function MMSE - Mini Mental State Exam 04/23/2017 03/19/2016  Orientation to time 5 5  Orientation to Place 5 5  Registration 3 3  Attention/ Calculation 5  5  Recall 3 3  Language- name 2 objects 2 2    Language- repeat 1 1  Language- follow 3 step command 3 3  Language- read & follow direction 1 1  Write a sentence 1 1  Copy design 1 1  Total score 30 30     6CIT Screen 01/29/2019  What Year? 0 points  What month? 0 points  What time? 0 points  Count back from 20 0 points  Months in reverse 0 points  Repeat phrase 0 points  Total Score 0    Immunization History  Administered Date(s) Administered  . Influenza, High Dose Seasonal PF 10/29/2012, 12/02/2015, 11/28/2016, 11/04/2017, 09/26/2018  . Influenza,inj,Quad PF,6+ Mos 11/03/2013  . Influenza-Unspecified 10/28/2014  . Pneumococcal Conjugate-13 10/29/2012  . Pneumococcal Polysaccharide-23 05/05/2014  . Td 08/01/1994, 06/27/2004  . Tdap 11/25/2006, 05/01/2013  . Zoster 04/16/2006, 12/30/2007   Screening Tests Health Maintenance  Topic Date Due  . MAMMOGRAM  12/25/2019  . COLONOSCOPY  10/21/2022  . TETANUS/TDAP  05/02/2023  . INFLUENZA VACCINE  Completed  . DEXA SCAN  Completed  . Hepatitis C Screening  Completed  . PNA vac Low Risk Adult  Completed      Plan:   Keep all routine maintenance appointments.   Medicare Attestation I have personally reviewed: The patient's medical and social history Their use of alcohol, tobacco or illicit drugs Their current medications and supplements The patient's functional ability including ADLs,fall risks, home safety risks, cognitive, and hearing and visual impairment Diet and physical activities Evidence for depression   I have reviewed and discussed with patient certain preventive protocols, quality metrics, and best practice recommendations.     Varney Biles, LPN  [id number]

## 2019-01-29 NOTE — Patient Instructions (Addendum)
  Dana Burke , Thank you for taking time to come for your Medicare Wellness Visit. I appreciate your ongoing commitment to your health goals. Please review the following plan we discussed and let me know if I can assist you in the future.   These are the goals we discussed: Goals    . Increase physical activity     Walk for exercise 3 times weekly, 30 minutes       This is a list of the screening recommended for you and due dates:  Health Maintenance  Topic Date Due  . Mammogram  12/25/2019  . Colon Cancer Screening  10/21/2022  . Tetanus Vaccine  05/02/2023  . Flu Shot  Completed  . DEXA scan (bone density measurement)  Completed  .  Hepatitis C: One time screening is recommended by Center for Disease Control  (CDC) for  adults born from [birth year] through 1965.   Completed  . Pneumonia vaccines  Completed

## 2019-02-07 ENCOUNTER — Ambulatory Visit: Payer: Medicare Other | Attending: Internal Medicine

## 2019-02-07 DIAGNOSIS — Z23 Encounter for immunization: Secondary | ICD-10-CM | POA: Insufficient documentation

## 2019-02-07 NOTE — Progress Notes (Signed)
   Covid-19 Vaccination Clinic  Name:  Dana Burke    MRN: [id number] DOB: Sep 18, 1945  02/07/2019  [name] was observed post Covid-19 immunization for 15 minutes without incidence. She was provided with Vaccine Information Sheet and instruction to access the V-Safe system.   [name] was instructed to call 911 with any severe reactions post vaccine: [name] Difficulty breathing  . Swelling of your face and throat  . A fast heartbeat  . A bad rash all over your body  . Dizziness and weakness    Immunizations Administered    Name Date Dose VIS Date Route   Pfizer COVID-19 Vaccine 02/07/2019  2:34 PM 0.3 mL 12/26/2018 Intramuscular   Manufacturer: [place]   Lot: [id number]   [place]: [id number]

## 2019-02-28 ENCOUNTER — Ambulatory Visit: Payer: Medicare Other | Attending: Internal Medicine

## 2019-02-28 DIAGNOSIS — Z23 Encounter for immunization: Secondary | ICD-10-CM | POA: Insufficient documentation

## 2019-02-28 NOTE — Progress Notes (Signed)
   Covid-19 Vaccination Clinic  Name:  Dana Burke    MRN: [id number] DOB: 05-06-1945  02/28/2019  Dana Burke was observed post Covid-19 immunization for 15 minutes without incidence. She was provided with Vaccine Information Sheet and instruction to access the V-Safe system.   Dana Burke was instructed to call 911 with any severe reactions post vaccine: [name] Difficulty breathing  . Swelling of your face and throat  . A fast heartbeat  . A bad rash all over your body  . Dizziness and weakness    Immunizations Administered    Name Date Dose VIS Date Route   Pfizer COVID-19 Vaccine 02/28/2019  9:44 AM 0.3 mL 12/26/2018 Intramuscular   Manufacturer: [place]   Lot: [id number]   [place]: [id number]

## 2019-05-22 ENCOUNTER — Other Ambulatory Visit: Payer: Self-pay | Admitting: Internal Medicine

## 2019-06-01 ENCOUNTER — Other Ambulatory Visit: Payer: Self-pay | Admitting: Internal Medicine

## 2019-07-13 ENCOUNTER — Other Ambulatory Visit: Payer: Self-pay | Admitting: Internal Medicine

## 2019-08-24 DIAGNOSIS — J40 Bronchitis, not specified as acute or chronic: Secondary | ICD-10-CM | POA: Diagnosis not present

## 2019-08-24 DIAGNOSIS — Z03818 Encounter for observation for suspected exposure to other biological agents ruled out: Secondary | ICD-10-CM | POA: Diagnosis not present

## 2019-08-24 DIAGNOSIS — B9689 Other specified bacterial agents as the cause of diseases classified elsewhere: Secondary | ICD-10-CM | POA: Diagnosis not present

## 2019-08-24 DIAGNOSIS — J019 Acute sinusitis, unspecified: Secondary | ICD-10-CM | POA: Diagnosis not present

## 2019-10-07 DIAGNOSIS — Z23 Encounter for immunization: Secondary | ICD-10-CM | POA: Diagnosis not present

## 2019-11-13 ENCOUNTER — Other Ambulatory Visit: Payer: Self-pay | Admitting: Internal Medicine

## 2019-12-08 ENCOUNTER — Other Ambulatory Visit: Payer: Self-pay | Admitting: Internal Medicine

## 2019-12-31 ENCOUNTER — Other Ambulatory Visit: Payer: Self-pay | Admitting: Internal Medicine

## 2020-02-01 ENCOUNTER — Ambulatory Visit: Payer: Medicare Other

## 2020-02-09 ENCOUNTER — Other Ambulatory Visit: Payer: Self-pay | Admitting: Internal Medicine

## 2020-02-09 DIAGNOSIS — Z1231 Encounter for screening mammogram for malignant neoplasm of breast: Secondary | ICD-10-CM

## 2020-02-10 ENCOUNTER — Other Ambulatory Visit: Payer: Self-pay | Admitting: Internal Medicine

## 2020-02-25 ENCOUNTER — Other Ambulatory Visit: Payer: Self-pay | Admitting: Internal Medicine

## 2020-03-01 DIAGNOSIS — H35371 Puckering of macula, right eye: Secondary | ICD-10-CM | POA: Diagnosis not present

## 2020-03-01 DIAGNOSIS — H524 Presbyopia: Secondary | ICD-10-CM | POA: Diagnosis not present

## 2020-03-23 ENCOUNTER — Ambulatory Visit
Admission: RE | Admit: 2020-03-23 | Discharge: 2020-03-23 | Disposition: A | Discharge status: Home / Self Care | Payer: Medicare Other | Source: Ambulatory Visit | Attending: Internal Medicine | Admitting: Internal Medicine

## 2020-03-23 ENCOUNTER — Other Ambulatory Visit: Payer: Self-pay

## 2020-03-23 DIAGNOSIS — Z1231 Encounter for screening mammogram for malignant neoplasm of breast: Secondary | ICD-10-CM | POA: Diagnosis not present

## 2020-03-30 ENCOUNTER — Telehealth: Payer: Self-pay | Admitting: Internal Medicine

## 2020-03-30 NOTE — Telephone Encounter (Signed)
Patient would like her mammogram results read to her.

## 2020-03-31 NOTE — Telephone Encounter (Signed)
Spoke with pt and went over her mammogram results with her. Pt gave a verbal understanding.

## 2020-04-28 ENCOUNTER — Other Ambulatory Visit: Payer: Self-pay | Admitting: Internal Medicine

## 2020-04-29 DIAGNOSIS — Z23 Encounter for immunization: Secondary | ICD-10-CM | POA: Diagnosis not present

## 2020-06-27 ENCOUNTER — Other Ambulatory Visit: Payer: Self-pay | Admitting: Internal Medicine

## 2020-09-06 ENCOUNTER — Other Ambulatory Visit: Payer: Self-pay | Admitting: Internal Medicine

## 2020-09-20 ENCOUNTER — Other Ambulatory Visit: Payer: Self-pay | Admitting: Internal Medicine

## 2020-09-30 DIAGNOSIS — Z23 Encounter for immunization: Secondary | ICD-10-CM | POA: Diagnosis not present

## 2020-10-12 ENCOUNTER — Other Ambulatory Visit: Payer: Self-pay | Admitting: Internal Medicine

## 2020-10-28 ENCOUNTER — Other Ambulatory Visit: Payer: Self-pay

## 2020-10-28 ENCOUNTER — Other Ambulatory Visit: Payer: Self-pay | Admitting: Internal Medicine

## 2020-10-28 NOTE — Telephone Encounter (Signed)
Called to speak with Dana Burke and schedule an appointment with PCP. Pt needs an appointment for further refills. Left a message to call back.

## 2020-11-01 NOTE — Addendum Note (Signed)
Addended by: Ezequiel Ganser on: 11/01/2020 02:57 PM   Modules accepted: Orders

## 2020-11-01 NOTE — Telephone Encounter (Signed)
Patient called in and is scheduled for an appointment on 12/14/20 with Dr.Tullo.

## 2020-11-01 NOTE — Telephone Encounter (Addendum)
Ok to refill Bupropion hcl?  Pt scheduled for 12/14/20. Medication pended for approval.

## 2020-11-02 MED ORDER — BUPROPION HCL ER (XL) 300 MG PO TB24: 300.0000 mg | ORAL_TABLET | Freq: Every day | ORAL | 2 refills | Status: DC

## 2020-12-06 ENCOUNTER — Ambulatory Visit: Payer: Medicare Other | Admitting: Internal Medicine

## 2020-12-14 ENCOUNTER — Telehealth: Payer: Self-pay | Admitting: Internal Medicine

## 2020-12-14 ENCOUNTER — Ambulatory Visit (INDEPENDENT_AMBULATORY_CARE_PROVIDER_SITE_OTHER): Payer: Medicare Other | Admitting: Internal Medicine

## 2020-12-14 ENCOUNTER — Encounter: Payer: Self-pay | Admitting: Internal Medicine

## 2020-12-14 ENCOUNTER — Other Ambulatory Visit: Payer: Self-pay

## 2020-12-14 VITALS — BP 118/84 | HR 81 | Temp 95.8°F | Ht 69.0 in | Wt 245.8 lb

## 2020-12-14 DIAGNOSIS — R490 Dysphonia: Secondary | ICD-10-CM | POA: Diagnosis not present

## 2020-12-14 DIAGNOSIS — G8929 Other chronic pain: Secondary | ICD-10-CM | POA: Diagnosis not present

## 2020-12-14 DIAGNOSIS — E7800 Pure hypercholesterolemia, unspecified: Secondary | ICD-10-CM | POA: Diagnosis not present

## 2020-12-14 DIAGNOSIS — R7303 Prediabetes: Secondary | ICD-10-CM | POA: Diagnosis not present

## 2020-12-14 DIAGNOSIS — R5383 Other fatigue: Secondary | ICD-10-CM

## 2020-12-14 DIAGNOSIS — R1013 Epigastric pain: Secondary | ICD-10-CM | POA: Diagnosis not present

## 2020-12-14 DIAGNOSIS — I499 Cardiac arrhythmia, unspecified: Secondary | ICD-10-CM | POA: Insufficient documentation

## 2020-12-14 DIAGNOSIS — E6609 Other obesity due to excess calories: Secondary | ICD-10-CM

## 2020-12-14 DIAGNOSIS — Z1231 Encounter for screening mammogram for malignant neoplasm of breast: Secondary | ICD-10-CM

## 2020-12-14 DIAGNOSIS — Z6836 Body mass index (BMI) 36.0-36.9, adult: Secondary | ICD-10-CM | POA: Diagnosis not present

## 2020-12-14 LAB — CBC WITH DIFFERENTIAL/PLATELET
Basophils Absolute: 0 10*3/uL (ref 0.0–0.1)
Basophils Relative: 0.3 % (ref 0.0–3.0)
Eosinophils Absolute: 0.2 10*3/uL (ref 0.0–0.7)
Eosinophils Relative: 3.1 % (ref 0.0–5.0)
HCT: 42.8 % (ref 36.0–46.0)
Hemoglobin: 14 g/dL (ref 12.0–15.0)
Lymphocytes Relative: 33.9 % (ref 12.0–46.0)
Lymphs Abs: 1.8 10*3/uL (ref 0.7–4.0)
MCHC: 32.7 g/dL (ref 30.0–36.0)
MCV: 93.7 fl (ref 78.0–100.0)
Monocytes Absolute: 0.3 10*3/uL (ref 0.1–1.0)
Monocytes Relative: 5.9 % (ref 3.0–12.0)
Neutro Abs: 3 10*3/uL (ref 1.4–7.7)
Neutrophils Relative %: 56.8 % (ref 43.0–77.0)
Platelets: 218 10*3/uL (ref 150.0–400.0)
RBC: 4.57 Mil/uL (ref 3.87–5.11)
RDW: 13 % (ref 11.5–15.5)
WBC: 5.3 10*3/uL (ref 4.0–10.5)

## 2020-12-14 LAB — COMPREHENSIVE METABOLIC PANEL
ALT: 15 U/L (ref 0–35)
AST: 14 U/L (ref 0–37)
Albumin: 4.3 g/dL (ref 3.5–5.2)
Alkaline Phosphatase: 85 U/L (ref 39–117)
BUN: 14 mg/dL (ref 6–23)
CO2: 27 mEq/L (ref 19–32)
Calcium: 10.2 mg/dL (ref 8.4–10.5)
Chloride: 105 mEq/L (ref 96–112)
Creatinine, Ser: 0.88 mg/dL (ref 0.40–1.20)
GFR: 63.27 mL/min (ref >60.00)
Glucose, Bld: 95 mg/dL (ref 70–99)
Potassium: 4.4 mEq/L (ref 3.5–5.1)
Sodium: 139 mEq/L (ref 135–145)
Total Bilirubin: 0.6 mg/dL (ref 0.2–1.2)
Total Protein: 6.8 g/dL (ref 6.0–8.3)

## 2020-12-14 LAB — LIPID PANEL
Cholesterol: 176 mg/dL (ref 0–200)
HDL: 38 mg/dL — ABNORMAL LOW (ref >39.00)
LDL Cholesterol: 111 mg/dL — ABNORMAL HIGH (ref 0–99)
NonHDL: 137.69
Total CHOL/HDL Ratio: 5
Triglycerides: 134 mg/dL (ref 0.0–149.0)
VLDL: 26.8 mg/dL (ref 0.0–40.0)

## 2020-12-14 LAB — MICROALBUMIN / CREATININE URINE RATIO
Creatinine,U: 100.1 mg/dL
Microalb Creat Ratio: 1.4 mg/g (ref 0.0–30.0)
Microalb, Ur: 1.4 mg/dL (ref 0.0–1.9)

## 2020-12-14 LAB — TSH: TSH: 1.19 u[IU]/mL (ref 0.35–5.50)

## 2020-12-14 LAB — HEMOGLOBIN A1C: Hgb A1c MFr Bld: 6 % (ref 4.6–6.5)

## 2020-12-14 LAB — MAGNESIUM: Magnesium: 2.1 mg/dL (ref 1.5–2.5)

## 2020-12-14 NOTE — Assessment & Plan Note (Addendum)
Diagnosed by Dr Richardson Landry during workup for hoarseness.  She has completed  speech therapy at [facility]

## 2020-12-14 NOTE — Assessment & Plan Note (Signed)
Suggested by A1c .  I recommend he follow a low glycemic index diet and particpate regularly in an aerobic  exercise activity.repeat assessment due.   Lab Results  Component Value Date   HGBA1C 5.8 (H) 08/28/2018

## 2020-12-14 NOTE — Assessment & Plan Note (Signed)
I have addressed  BMI and recommended a low glycemic index diet utilizing smaller more frequent meals to increase metabolism.  I have also recommended that patient start exercising with a goal of 30 minutes of aerobic exercise a minimum of 5 days per week. Screening for lipid disorders, thyroid and diabetes to be done today.   

## 2020-12-14 NOTE — Assessment & Plan Note (Addendum)
She has been noticing an arrhtyhmia suggested of a sinus pause that has been occurring for the past 2 months. I have ordered and reviewed a 12 lead EKG and find that there are no acute changes and patient is in sinus rhythm.  She has a chronic incomplete RBBB.  Referral to cardiology in process

## 2020-12-14 NOTE — Telephone Encounter (Signed)
Lft pt vm to call ofc to sch US. thanks ?

## 2020-12-14 NOTE — Assessment & Plan Note (Addendum)
Etiology unclear, with reports of RUQ pain and history of cholecystectomy.  U/S ordered

## 2020-12-14 NOTE — Patient Instructions (Signed)
Cardiology referral in process  Abdominal ultrasound ordered

## 2020-12-14 NOTE — Progress Notes (Signed)
Patient ID: Dana Burke, female    DOB: Feb 12, 1945  Age: 75 y.o. MRN: [id number]  The patient is here for  follow up and management of other chronic and acute problems.  This visit occurred during the SARS-CoV-2 public health emergency.  Safety protocols were in place, including screening questions prior to the visit, additional usage of staff PPE, and extensive cleaning of exam room while observing appropriate contact time as indicated for disinfecting solutions.     The risk factors are reflected in the social history.  The roster of all physicians providing medical care to patient - is listed in the Snapshot section of the chart.  Activities of daily living:  The patient is 100% independent in all ADLs: dressing, toileting, feeding as well as independent mobility  Home safety : The patient has smoke detectors in the home. They wear seatbelts.  There are no firearms at home. There is no violence in the home.   There is no risks for hepatitis, STDs or HIV. There is no   history of blood transfusion. They have no travel history to infectious disease endemic areas of the world.  The patient has seen their dentist in the last six month. They have seen their eye doctor in the last year. They admit to slight hearing difficulty with regard to whispered voices and some television programs.  They have deferred audiologic testing in the last year.  They do not  have excessive sun exposure. Discussed the need for sun protection: hats, long sleeves and use of sunscreen if there is significant sun exposure.   Diet: the importance of a healthy diet is discussed. They do have a healthy diet.  The benefits of regular aerobic exercise were discussed. She walks 4 times per week ,  20 minutes.   Depression screen: there are no signs or vegative symptoms of depression- irritability, change in appetite, anhedonia, sadness/tearfullness.  Cognitive assessment: the patient manages all their financial and  personal affairs and is actively engaged. They could relate day,date,year and events; recalled 2/3 objects at 3 minutes; performed clock-face test normally.  The following portions of the patient's history were reviewed and updated as appropriate: allergies, current medications, past family history, past medical history,  past surgical history, past social history  and problem list.  Visual acuity was not assessed per patient preference since she has regular follow up with her ophthalmologist. Hearing and body mass index were assessed and reviewed.   During the course of the visit the patient was educated and counseled about appropriate screening and preventive services including : fall prevention , diabetes screening, nutrition counseling, colorectal cancer screening, and recommended immunizations.    CC: The primary encounter diagnosis was Pure hypercholesterolemia. Diagnoses of Prediabetes, Other fatigue, Encounter for screening mammogram for malignant neoplasm of breast, Irregular heart rate, Dysphonia, Cardiac arrhythmia, unspecified cardiac arrhythmia type, Chronic epigastric pain, and Class 2 obesity due to excess calories without serious comorbidity with body mass index (BMI) of 36.0 to 36.9 in adult were also pertinent to this visit.  1) Diagnosed with dysphonia  by ENT  in 2020  as the cause her hoarseness  Sleep study not done given alternative diagnosis  received [facility] Therapy at [facility]>  occurs after URI's mostly   2) Irregular heart rhythm for the last 2 months. Occurs every 40 to 60 beats. Feels like a skipped beat.  No symptoms of presyncope or dizziness.  Has not been  worked up.  She recalls that during thyroid nodule  surgery she was noted to have an arrhythmia  .    3)Recurrent episodes of RUQ pain described as "tearing feeling , feels like a pulled muscle   sometimes under the right scapula.  Intermittent occurs 2-3 times per week not related to eating.  S/p cholecystectomy remotely .   No nausea or weight loss   4) Constipation managed with a stool softener for the past 2 years.  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   She has a past surgical history that includes Appendectomy (1963); Cholecystectomy (1996); Fracture surgery (2001); Thyroglossal duct cyst (N/A, 06/22/2015); and Breast biopsy (Left, 11/10/2015).   Her family history includes Bladder Cancer in her brother; Breast cancer in her cousin, cousin, cousin, cousin, cousin, and sister; Cancer in her brother, paternal uncle, sister, and sister; Dementia in her mother; Diabetes in her brother, brother, brother, and father; Heart disease in her father and mother; Heart disease (age of onset: 64) in her brother; Hyperlipidemia in her father and mother; Seizures in her grandchild.She reports that she quit smoking about 33 years ago. Her smoking use included cigarettes. She has a 0.80 pack-year smoking history. She has never used smokeless tobacco. She reports that she does not drink alcohol and does not use drugs.  Outpatient Medications Prior to Visit  Medication Sig Dispense Refill   albuterol (VENTOLIN HFA) 108 (90 Base) MCG/ACT inhaler Inhale into the lungs every 6 (six) hours as needed for wheezing or shortness of breath.     benzonatate (TESSALON) 100 MG capsule Take 1 capsule (100 mg total) by mouth 3 (three) times daily as needed for cough. 30 capsule 1   buPROPion (WELLBUTRIN XL) 300 MG 24 hr tablet Take 1 tablet (300 mg total) by mouth daily. 90 tablet 2   calcium carbonate (OS-CAL) 600 MG TABS Take 600 mg by mouth 2 (two) times daily with a meal.     cetirizine (ZYRTEC) 10 MG tablet Take 10 mg by mouth at bedtime.      Cholecalciferol (VITAMIN D-3) 1000 UNITS CAPS  Take 1 capsule by mouth daily.     Docusate Sodium (COLACE PO) Take 1 capsule by mouth daily.     escitalopram (LEXAPRO) 10 MG tablet TAKE 1 TABLET BY MOUTH ONCE DAILY 90 tablet 1   esomeprazole (NEXIUM) 40 MG capsule TAKE 1 CAPSULE BY MOUTH ONCE DAILY AT 12NOON 90 capsule 1   gabapentin (NEURONTIN) 300 MG capsule TAKE 1 CAPSULE BY MOUTH 4 TIMES DAILY 360 capsule 1   Melatonin 1 MG TABS Take 1 tablet by mouth.     Multiple Vitamins-Minerals (MULTIVITAMIN WITH MINERALS) tablet Take 1 tablet by mouth daily.     simvastatin (ZOCOR) 20 MG tablet TAKE 1 TABLET BY MOUTH AT BEDTIME 90 tablet 3   No facility-administered medications prior to visit.    Review of Systems  Patient denies headache, fevers, malaise, unintentional weight loss, skin rash, eye pain, sinus congestion and sinus pain, sore throat, dysphagia,  hemoptysis , cough, dyspnea, wheezing, chest pain, palpitations, orthopnea, edema, abdominal pain, nausea, melena, diarrhea, constipation, flank pain, dysuria, hematuria, urinary  Frequency, nocturia, numbness, tingling, seizures,  Focal weakness, Loss of consciousness,  Tremor, insomnia, depression, anxiety, and suicidal ideation.     Objective:  BP 118/84 (BP Location: Left Arm, Patient  Position: Sitting, Cuff Size: Large)   Pulse 81   Temp (!) 95.8 F (35.4 C) (Temporal)   Ht _0  (1.753 m)   Wt 245 lb 12.8 oz (111.5 kg)   SpO2 97%   BMI 36.30 kg/m   Physical Exam   General appearance: alert, cooperative and appears stated age Head: Normocephalic, without obvious abnormality, atraumatic Eyes: conjunctivae/corneas clear. PERRL, EOM's intact. Fundi benign. Ears: normal TM's and external ear canals both ears Nose: Nares normal. Septum midline. Mucosa normal. No drainage or sinus tenderness. Throat: lips, mucosa, and tongue normal; teeth and gums normal Neck: no adenopathy, no carotid bruit, no JVD, supple, symmetrical, trachea midline and thyroid not enlarged, symmetric, no  tenderness/mass/nodules Lungs: clear to auscultation bilaterally Breasts: normal appearance, no masses or tenderness Heart: regular rate and rhythm, S1, S2 normal, no murmur, click, rub or gallop Abdomen: soft,  epigastric tenderness  without rebound or guarding.  Abd wall is firm  but obese ,  no organomegaly Extremities: extremities normal, atraumatic, no cyanosis or edema Pulses: 2+ and symmetric Skin: Skin color, texture, turgor normal. No rashes or lesions Neurologic: Alert and oriented X 3, normal strength and tone. Normal symmetric reflexes. Normal coordination and gait.    Assessment & Plan:   Problem List Items Addressed This Visit     Cardiac arrhythmia    She has been noticing an arrhtyhmia suggested of a sinus pause that has been occurring for the past 2 months. I have ordered and reviewed a 12 lead EKG and find that there are no acute changes and patient is in sinus rhythm.  She has a chronic incomplete RBBB.  Referral to cardiology in process       Relevant Orders   Magnesium   Ambulatory referral to Cardiology   Chronic epigastric pain    Etiology unclear, with reports of RUQ pain and history of cholecystectomy.  U/S ordered       Relevant Orders   US Abdomen Complete   Dysphonia    Diagnosed by Dr Richardson Landry during workup for hoarseness.  She has completed  speech therapy at [facility]       Hyperlipidemia - Primary   Relevant Orders   Lipid Profile   Obesity    I have addressed  BMI and recommended a low glycemic index diet utilizing smaller more frequent meals to increase metabolism.  I have also recommended that patient start exercising with a goal of 30 minutes of aerobic exercise a minimum of 5 days per week. Screening for lipid disorders, thyroid and diabetes to be done today.        Prediabetes    Suggested by A1c .  I recommend he follow a low glycemic index diet and particpate regularly in an aerobic  exercise activity.repeat assessment due.   Lab Results   Component Value Date   HGBA1C 5.8 (H) 08/28/2018         Relevant Orders   Comp Met (CMET)   HgB A1c   Urine Microalbumin w/creat. ratio   Other Visit Diagnoses     Other fatigue       Relevant Orders   CBC with Differential/Platelet   TSH   Encounter for screening mammogram for malignant neoplasm of breast       Relevant Orders   MM 3D SCREEN BREAST BILATERAL   Irregular heart rate       Relevant Orders   EKG 12-Lead (Completed)       I am having Mike C.  Yonts maintain her multivitamin with minerals, calcium carbonate, Vitamin D-3, cetirizine, Docusate Sodium (COLACE PO), melatonin, benzonatate, albuterol, esomeprazole, escitalopram, gabapentin, simvastatin, and buPROPion.  No orders of the defined types were placed in this encounter.   There are no discontinued medications.  Follow-up: No follow-ups on file.   Crecencio Mc, MD

## 2020-12-15 ENCOUNTER — Other Ambulatory Visit: Payer: Self-pay | Admitting: Internal Medicine

## 2020-12-15 HISTORY — PX: ABDOMINAL SURGERY: SHX537

## 2020-12-15 MED ORDER — SIMVASTATIN 40 MG PO TABS: 40.0000 mg | ORAL_TABLET | Freq: Every day | ORAL | 1 refills | Status: DC

## 2020-12-16 ENCOUNTER — Telehealth: Payer: Self-pay | Admitting: Internal Medicine

## 2020-12-16 NOTE — Telephone Encounter (Signed)
Noted  

## 2020-12-16 NOTE — Telephone Encounter (Signed)
Pt called in regards to lab results. Pt was advised of results and that her medication has been increased and sent into her pharmacy. Pt will call back if she has any questions or concerns regarding this. Pt gave verbal understanding.

## 2020-12-21 ENCOUNTER — Encounter: Payer: Self-pay | Admitting: Cardiology

## 2020-12-21 NOTE — Progress Notes (Signed)
Primary Care Provider: Crecencio Mc, MD [facility] Cardiologist: None Electrophysiologist: None  Clinic Note: Chief Complaint  Patient presents with   New Patient (Initial Visit)    New patient to establish care with provider for cardiac arrhythmia. Medications verbally reviewed with patient.    Regular job ===================================  ASSESSMENT/PLAN   Problem List Items Addressed This Visit       Cardiology Problems   Hyperlipidemia with target low density lipoprotein (LDL) cholesterol less than 100 mg/dL - Primary (Chronic)    Most recent lipids show total cholesterol 176 with LDL 111.  This is a notable improvement from 2017.  She is on simvastatin 40 mg daily.  Can consider converting to more potent statin such as atorvastatin or rosuvastatin which at the same doses would likely be more potent.        Other   Irregular heartbeat (Chronic)    The standard evaluation for symptoms concerning for an arrhythmia is an event monitor.  Will order a 7-day Zio patch monitor and follow-up results.  She had a normal echocardiogram just 2 years ago.  No suspicion for structural abnormalities. She is not on a beta-blocker or other rate control agent..      Relevant Orders   LONG TERM MONITOR (3-14 DAYS)    ===================================  HPI:    Dana Burke is an obese 75 y.o. 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  Drake Leach Beagle was seen by Dr. Derrel Nip  on December 14, 2020 with a complaint of irregular heartbeat/rhythm for the last 2 months.  She noted irregular heartbeat every 40-60 beats she feels a skipped beat.  No syncope or near syncope.  Apparently was told she had an  arrhythmia during surgery for thyroid nodule.  There was concern for possible "sinus pauses ".  EKG was normal with exception of incomplete RBBB that is chronic.  --> also noted abdominal pain -- pending Abd [place]. .->  Referred for cardiology evaluation  Recent Hospitalizations: None  Reviewed  CV studies:    The following studies were reviewed today: (if available, images/films reviewed: From Epic Chart or Care Everywhere) Transthoracic Echo 09/11/2018: EF 60 to 65%.  GR 1 DD.  Normal RV size and function.  Mild LA dilation.  Normal valves. (In setting of Thyroglossal Cyst)  Interval History:   Dana Burke presents for cardiology evaluation.  She does have some baseline exertional dyspnea but really attributes this to being deconditioned and not exercising much.  She says she has mild exertional dyspnea, and maybe some exercise intolerance.  No chest pain or pressure.  She actually does not have any abnormal sensations from her irregular heartbeats.  She just notes that when she is feeling her pulse she will feel intermittent episodes of pauses and skipped beats.  She does not have any lightheadedness or dizziness associated with it.  No chest pain or pressure.  No syncope or near syncope.  CV Review of Symptoms (Summary) Cardiovascular ROS: positive for - dyspnea on exertion, irregular heartbeat, and occasional L arm pain (random) negative for - chest pain, edema, orthopnea, paroxysmal nocturnal dyspnea, rapid heart rate, shortness of breath, or syncope/near syncope, TIA/amaurosis fugax. Claudication  REVIEWED OF SYSTEMS  Review of Systems  Constitutional:  Positive for weight loss. Negative for malaise/fatigue (just  sedentary).  Respiratory:  Positive for wheezing. Negative for cough.        Chronic hoarseness - spasmotic dysphonia  Cardiovascular:        Per HPI  Gastrointestinal:  Positive for abdominal pain and nausea. Negative for blood in stool.       Currently undergoing  GI work-up as well.  Genitourinary:  Negative for hematuria.  Musculoskeletal:  Negative for back pain, joint pain and myalgias.       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 Negative for memory loss. The patient has insomnia (occasionally takes melatonin or Ty-PM). The patient is not nervous/anxious.    I have reviewed and (if needed) personally updated the patient's problem list, medications, allergies, past medical and surgical history, social and family history.   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  GR 1 DD.  Normal RV size and function.  Mild LA dilation.  Normal valves.    Immunization History  Administered Date(s) Administered   Influenza, High Dose Seasonal PF 10/29/2012, 12/02/2015, 11/28/2016, 11/04/2017, 09/26/2018   Influenza,inj,Quad PF,6+ Mos 11/03/2013   Influenza-Unspecified 10/28/2014, 09/30/2020   PFIZER(Purple Top)SARS-COV-2 Vaccination 02/07/2019, 02/28/2019, 10/15/2019, 04/29/2020   Pfizer Covid-19 Vaccine Bivalent Booster 31yrs & up 09/30/2020   Pneumococcal Conjugate-13 10/29/2012   Pneumococcal  Polysaccharide-23 05/05/2014   Td 08/01/1994, 06/27/2004   Tdap 11/25/2006, 05/01/2013   Zoster, Live 04/16/2006, 12/30/2007    MEDICATIONS/ALLERGIES   Current Meds  Medication Sig   albuterol (VENTOLIN HFA) 108 (90 Base) MCG/ACT inhaler Inhale into the lungs every 6 (six) hours as needed for wheezing or shortness of breath.   benzonatate (TESSALON) 100 MG capsule Take 1 capsule (100 mg total) by mouth 3 (three) times daily as needed for cough.   buPROPion (WELLBUTRIN XL) 300 MG 24 hr tablet Take 1 tablet (300 mg total) by mouth daily.   calcium carbonate (OS-CAL) 600 MG TABS Take 600 mg by mouth 2 (two) times daily with a meal.   cetirizine (ZYRTEC) 10 MG tablet Take 10 mg by mouth at bedtime.    Cholecalciferol (VITAMIN D-3) 1000 UNITS CAPS Take 1 capsule by mouth daily.   Docusate Sodium (COLACE PO) Take 1 capsule by mouth daily.   escitalopram (LEXAPRO) 10 MG tablet TAKE 1 TABLET BY MOUTH ONCE DAILY   esomeprazole (NEXIUM) 40 MG capsule TAKE 1 CAPSULE BY MOUTH ONCE DAILY AT 12NOON   gabapentin (NEURONTIN)  300 MG capsule TAKE 1 CAPSULE BY MOUTH 4 TIMES DAILY   Melatonin 10 MG TABS Take 1 tablet by mouth.   Multiple Vitamins-Minerals (MULTIVITAMIN WITH MINERALS) tablet Take 1 tablet by mouth daily.   simvastatin (ZOCOR) 40 MG tablet Take 1 tablet (40 mg total) by mouth at bedtime.    No Known Allergies  SOCIAL HISTORY/FAMILY HISTORY   Reviewed in Epic:   Social History   Tobacco Use   Smoking status: Former    Packs/day: 0.40    Years: 2.00    Pack years: 0.80    Types: Cigarettes    Quit date: 07/30/1987    Years since quitting: 33.4   Smokeless tobacco: Never  Vaping Use   Vaping Use: Never used  Substance Use Topics   Alcohol use: No   Drug use: No   Social History   Social History Narrative   She is a retired Marine scientist.   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 -- Now is helping her brother care for his wife - now on Hospice.    Family History  Problem Relation Age of Onset   Heart disease Mother    Hyperlipidemia Mother    Dementia Mother    Congestive Heart Failure Father    Hyperlipidemia Father    Diabetes Father    Heart disease Father        Status post pacemaker   Cancer Sister        melanoma   Cancer Sister        breast   Breast cancer Sister        diagnosed 3 times - lastest 47s   Cancer Brother        skin cancer   Bladder Cancer Brother    Diabetes Brother    Diabetes Brother    Coronary artery disease Brother 67       4 vessel CABG   Diabetes Brother    Cancer Paternal Uncle        colon   Seizures Grandchild    Breast cancer Cousin    Breast cancer Cousin    Breast cancer Cousin    Breast cancer Cousin    Breast cancer Cousin     OBJCTIVE -PE, EKG, labs   Wt Readings from Last 3 Encounters:  12/22/20 244 lb (110.7 kg)  12/14/20 245 lb 12.8 oz (111.5 kg)  01/29/19 253 lb (114.8 kg)    Physical Exam: BP 110/72 (BP Location: Left Arm, Patient Position: Sitting, Cuff Size: Normal)   Pulse 84   Ht 5\' 10"  (1.778 m)   Wt 244 lb (110.7 kg)   SpO2 96%   BMI 35.01 kg/m  Physical Exam Vitals reviewed.  Constitutional:      General: She is not in acute distress.    Appearance: Normal appearance. She is obese. She is not ill-appearing or toxic-appearing.  HENT:     Head: Normocephalic and atraumatic.  Neck:     Vascular: No carotid bruit, hepatojugular reflux or JVD.  Cardiovascular:     Rate and Rhythm: Normal rate and regular rhythm. Occasional Extrasystoles are present.    Chest Wall: PMI is not displaced.     Pulses: Normal pulses.     Heart sounds: Normal heart sounds, S1 normal and S2 normal. No murmur heard.   No friction rub. No gallop.  Pulmonary:     Effort: Pulmonary effort is normal. No respiratory distress.  Breath sounds: Normal breath sounds.  Chest:     Chest wall: No tenderness.  Musculoskeletal:        General: No swelling.     Cervical back:  Normal range of motion and neck supple.  Skin:    General: Skin is warm and dry.  Neurological:     General: No focal deficit present.     Mental Status: She is alert and oriented to person, place, and time.  Psychiatric:        Mood and Affect: Mood normal.        Behavior: Behavior normal.        Thought Content: Thought content normal.        Judgment: Judgment normal.     Adult ECG Report  Rate: 84;  Rhythm: normal sinus rhythm and incomplete right bundle branch block.  Otherwise normal axis, intervals durations. ;   Narrative Interpretation: Borderline  Recent Labs:  reviewed  Lab Results  Component Value Date   CHOL 176 12/14/2020   HDL 38.00 (L) 12/14/2020   LDLCALC 111 (H) 12/14/2020   LDLDIRECT 160.0 12/02/2015   TRIG 134.0 12/14/2020   CHOLHDL 5 12/14/2020   Lab Results  Component Value Date   CREATININE 0.88 12/14/2020   BUN 14 12/14/2020   NA 139 12/14/2020   K 4.4 12/14/2020   CL 105 12/14/2020   CO2 27 12/14/2020   CBC Latest Ref Rng & Units 12/14/2020 08/28/2018 12/02/2015  WBC 4.0 - 10.5 K/uL 5.3 4.8 9.6  Hemoglobin 12.0 - 15.0 g/dL 14.0 13.9 14.1  Hematocrit 36.0 - 46.0 % 42.8 41.6 42.1  Platelets 150.0 - 400.0 K/uL 218.0 196 222.0    Lab Results  Component Value Date   HGBA1C 6.0 12/14/2020   Lab Results  Component Value Date   TSH 1.19 12/14/2020    ==================================================  COVID-19 Education: The signs and symptoms of COVID-19 were discussed with the patient and how to seek care for testing (follow up with PCP or arrange E-visit).    I spent a total of 21 minutes with the patient spent in direct patient consultation.  Additional time spent with chart review  / charting (studies, outside notes, etc): 15 min Total Time: 36 min  Current medicines are reviewed at length with the patient today.  (+/- concerns) none  This visit occurred during the SARS-CoV-2 public health emergency.  Safety protocols were in place,  including screening questions prior to the visit, additional usage of staff PPE, and extensive cleaning of exam room while observing appropriate contact time as indicated for disinfecting solutions.  Notice: This dictation was prepared with Dragon dictation along with smart phrase technology. Any transcriptional errors that result from this process are unintentional and may not be corrected upon review.   Studies Ordered:  Orders Placed This Encounter  Procedures   LONG TERM MONITOR (3-14 DAYS)   EKG 12-Lead     Patient Instructions / Medication Changes & Studies & Tests Ordered   Patient Instructions  Medication Instructions:  Your physician recommends that you continue on your current medications as directed. Please refer to the Current Medication list given to you today.  *If you need a refill on your cardiac medications before your next appointment, please call your pharmacy*   Lab Work: NONE If you have labs (blood work) drawn today and your tests are completely normal, you will receive your results only by: [place] (if you have MyChart) OR A paper copy in the mail If  you have any lab test that is abnormal or we need to change your treatment, we will call you to review the results.   Testing/Procedures: [place]- Long Term Monitor Instructions  Your physician has requested you wear a ZIO patch monitor for 3 days.  This is a single patch monitor. [employer] supplies one patch monitor per enrollment. Additional stickers are not available. Please do not apply patch if you will be having a Nuclear Stress Test,  Echocardiogram, Cardiac CT, MRI, or Chest Xray during the period you would be wearing the  monitor. The patch cannot be worn during these tests. You cannot remove and re-apply the  ZIO XT patch monitor.  Your ZIO patch monitor will be mailed 3 day USPS to your address on file. It may take 3-5 days  to receive your monitor after you have been enrolled.  Once you have  received your monitor, please review the enclosed instructions. Your monitor  has already been registered assigning a specific monitor serial # to you.  Billing and Patient Assistance Program Information  We have supplied [employer] with any of your insurance information on file for billing purposes. [employer] offers a sliding scale Patient Assistance Program for patients that do not have  insurance, or whose insurance does not completely cover the cost of the ZIO monitor.  You must apply for the Patient Assistance Program to qualify for this discounted rate.  To apply, please call [employer] at [phone], select option 4, select option 2, ask to apply for  Patient Assistance Program. Theodore Demark will ask your household income, and how many people  are in your household. They will quote your out-of-pocket cost based on that information.  [employer] will also be able to set up a 24-month, interest-free payment plan if needed.  Applying the monitor   Shave hair from upper left chest.  Hold abrader disc by orange tab. Rub abrader in 40 strokes over the upper left chest as  indicated in your monitor instructions.  Clean area with 4 enclosed alcohol pads. Let dry.  Apply patch as indicated in monitor instructions. Patch will be placed under collarbone on left  side of chest with arrow pointing upward.  Rub patch adhesive wings for 2 minutes. Remove white label marked "1". Remove the white  label marked "2". Rub patch adhesive wings for 2 additional minutes.  While looking in a mirror, press and release button in center of patch. A small green light will  flash 3-4 times. This will be your only indicator that the monitor has been turned on.  Do not shower for the first 24 hours. You may shower after the first 24 hours.  Press the button if you feel a symptom. You will hear a small click. Record Date, Time and  Symptom in the Patient Logbook.  When you are ready to remove the patch, follow instructions on  the last 2 pages of Patient  Logbook. Stick patch monitor onto the last page of Patient Logbook.  Place Patient Logbook in the blue and white box. Use locking tab on box and tape box closed  securely. The blue and white box has prepaid postage on it. Please place it in the mailbox as  soon as possible. Your physician should have your test results approximately 7 days after the  monitor has been mailed back to [facility].  Call [facility] at [phone] if you have questions regarding  your ZIO XT patch monitor. Call them immediately if you see an  orange light blinking on your  monitor.  If your monitor falls off in less than 4 days, contact our Monitor department at [phone].  If your monitor becomes loose or falls off after 4 days call [employer] at [phone] for  suggestions on securing your monitor    Follow-Up: At [facility], you and your health needs are our priority.  As part of our continuing mission to provide you with exceptional heart care, we have created designated Provider Care Teams.  These Care Teams include your primary Cardiologist (physician) and Advanced Practice Providers (APPs -  Physician Assistants and Nurse Practitioners) who all work together to provide you with the care you need, when you need it.  We recommend signing up for the patient portal called "MyChart".  Sign up information is provided on this After Visit Summary.  MyChart is used to connect with patients for Virtual Visits (Telemedicine).  Patients are able to view lab/test results, encounter notes, upcoming appointments, etc.  Non-urgent messages can be sent to your provider as well.   To learn more about what you can do with MyChart, go to [email or web address].    Your next appointment:   FOLLOW UP AFTER MONITOR IS COMPLETED    Provider:   You may see DR. Anjoli Diemer  or one of the following Advanced Practice Providers on your designated Care Team:   Murray Hodgkins,  NP Christell Faith, PA-C Cadence Kathlen Mody, [place]    Glenetta Hew, M.D., M.S. Interventional Cardiologist   Pager # [phone] Phone # [phone] [address] [place], [place] [address]   Thank you for choosing [facility] in [place]!!

## 2020-12-21 NOTE — Assessment & Plan Note (Addendum)
The standard evaluation for symptoms concerning for an arrhythmia is an event monitor.  Will order a 7-day Zio patch monitor and follow-up results.  She had a normal echocardiogram just 2 years ago.  No suspicion for structural abnormalities. She is not on a beta-blocker or other rate control agent.[name]

## 2020-12-22 ENCOUNTER — Ambulatory Visit (INDEPENDENT_AMBULATORY_CARE_PROVIDER_SITE_OTHER): Payer: Medicare Other

## 2020-12-22 ENCOUNTER — Encounter: Payer: Self-pay | Admitting: Cardiology

## 2020-12-22 ENCOUNTER — Ambulatory Visit (INDEPENDENT_AMBULATORY_CARE_PROVIDER_SITE_OTHER): Payer: Medicare Other | Admitting: Cardiology

## 2020-12-22 ENCOUNTER — Other Ambulatory Visit: Payer: Self-pay

## 2020-12-22 VITALS — BP 110/72 | HR 84 | Ht 70.0 in | Wt 244.0 lb

## 2020-12-22 DIAGNOSIS — E785 Hyperlipidemia, unspecified: Secondary | ICD-10-CM | POA: Diagnosis not present

## 2020-12-22 DIAGNOSIS — I499 Cardiac arrhythmia, unspecified: Secondary | ICD-10-CM | POA: Diagnosis not present

## 2020-12-22 DIAGNOSIS — R7303 Prediabetes: Secondary | ICD-10-CM

## 2020-12-22 DIAGNOSIS — R002 Palpitations: Secondary | ICD-10-CM

## 2020-12-22 NOTE — Patient Instructions (Signed)
Medication Instructions:  Your physician recommends that you continue on your current medications as directed. Please refer to the Current Medication list given to you today.  *If you need a refill on your cardiac medications before your next appointment, please call your pharmacy*   Lab Work: NONE If you have labs (blood work) drawn today and your tests are completely normal, you will receive your results only by: [place] (if you have MyChart) OR A paper copy in the mail If you have any lab test that is abnormal or we need to change your treatment, we will call you to review the results.   Testing/Procedures: [place]- Long Term Monitor Instructions  Your physician has requested you wear a ZIO patch monitor for 3 days.  This is a single patch monitor. [employer] supplies one patch monitor per enrollment. Additional stickers are not available. Please do not apply patch if you will be having a Nuclear Stress Test,  Echocardiogram, Cardiac CT, MRI, or Chest Xray during the period you would be wearing the  monitor. The patch cannot be worn during these tests. You cannot remove and re-apply the  ZIO XT patch monitor.  Your ZIO patch monitor will be mailed 3 day USPS to your address on file. It may take 3-5 days  to receive your monitor after you have been enrolled.  Once you have received your monitor, please review the enclosed instructions. Your monitor  has already been registered assigning a specific monitor serial # to you.  Billing and Patient Assistance Program Information  We have supplied [employer] with any of your insurance information on file for billing purposes. [employer] offers a sliding scale Patient Assistance Program for patients that do not have  insurance, or whose insurance does not completely cover the cost of the ZIO monitor.  You must apply for the Patient Assistance Program to qualify for this discounted rate.  To apply, please call [employer] at [phone], select  option 4, select option 2, ask to apply for  Patient Assistance Program. Theodore Demark will ask your household income, and how many people  are in your household. They will quote your out-of-pocket cost based on that information.  [employer] will also be able to set up a 61-month interest-free payment plan if needed.  Applying the monitor   Shave hair from upper left chest.  Hold abrader disc by orange tab. Rub abrader in 40 strokes over the upper left chest as  indicated in your monitor instructions.  Clean area with 4 enclosed alcohol pads. Let dry.  Apply patch as indicated in monitor instructions. Patch will be placed under collarbone on left  side of chest with arrow pointing upward.  Rub patch adhesive wings for 2 minutes. Remove white label marked "1". Remove the white  label marked "2". Rub patch adhesive wings for 2 additional minutes.  While looking in a mirror, press and release button in center of patch. A small green light will  flash 3-4 times. This will be your only indicator that the monitor has been turned on.  Do not shower for the first 24 hours. You may shower after the first 24 hours.  Press the button if you feel a symptom. You will hear a small click. Record Date, Time and  Symptom in the Patient Logbook.  When you are ready to remove the patch, follow instructions on the last 2 pages of Patient  Logbook. Stick patch monitor onto the last page of Patient Logbook.  Place Patient Logbook in  the blue and white box. Use locking tab on box and tape box closed  securely. The blue and white box has prepaid postage on it. Please place it in the mailbox as  soon as possible. Your physician should have your test results approximately 7 days after the  monitor has been mailed back to [facility].  Call [place] at [phone] if you have questions regarding  your ZIO XT patch monitor. Call them immediately if you see an orange light blinking on your  monitor.   If your monitor falls off in less than 4 days, contact our Monitor department at [phone].  If your monitor becomes loose or falls off after 4 days call [employer] at [phone] for  suggestions on securing your monitor    Follow-Up: At [facility], you and your health needs are our priority.  As part of our continuing mission to provide you with exceptional heart care, we have created designated Provider Care Teams.  These Care Teams include your primary Cardiologist (physician) and Advanced Practice Providers (APPs -  Physician Assistants and Nurse Practitioners) who all work together to provide you with the care you need, when you need it.  We recommend signing up for the patient portal called "MyChart".  Sign up information is provided on this After Visit Summary.  MyChart is used to connect with patients for Virtual Visits (Telemedicine).  Patients are able to view lab/test results, encounter notes, upcoming appointments, etc.  Non-urgent messages can be sent to your provider as well.   To learn more about what you can do with MyChart, go to [email or web address].    Your next appointment:   FOLLOW UP AFTER MONITOR IS COMPLETED    Provider:   You may see DR. HARDING  or one of the following Advanced Practice Providers on your designated Care Team:   Murray Hodgkins, NP Christell Faith, PA-C Cadence Kathlen Mody, [place]

## 2020-12-23 ENCOUNTER — Ambulatory Visit
Admission: RE | Admit: 2020-12-23 | Discharge: 2020-12-23 | Disposition: A | Discharge status: Home / Self Care | Payer: Medicare Other | Source: Ambulatory Visit | Attending: Internal Medicine | Admitting: Internal Medicine

## 2020-12-23 DIAGNOSIS — G8929 Other chronic pain: Secondary | ICD-10-CM | POA: Diagnosis not present

## 2020-12-23 DIAGNOSIS — R1013 Epigastric pain: Secondary | ICD-10-CM | POA: Insufficient documentation

## 2020-12-24 ENCOUNTER — Encounter: Payer: Self-pay | Admitting: Cardiology

## 2020-12-24 NOTE — Assessment & Plan Note (Signed)
Most recent lipids show total cholesterol 176 with LDL 111.  This is a notable improvement from 2017.  She is on simvastatin 40 mg daily.  Can consider converting to more potent statin such as atorvastatin or rosuvastatin which at the same doses would likely be more potent.

## 2020-12-26 ENCOUNTER — Encounter: Payer: Self-pay | Admitting: Internal Medicine

## 2020-12-26 ENCOUNTER — Telehealth: Payer: Self-pay

## 2020-12-26 ENCOUNTER — Other Ambulatory Visit: Payer: Self-pay | Admitting: Internal Medicine

## 2020-12-26 DIAGNOSIS — N838 Other noninflammatory disorders of ovary, fallopian tube and broad ligament: Secondary | ICD-10-CM | POA: Insufficient documentation

## 2020-12-26 DIAGNOSIS — R1900 Intra-abdominal and pelvic swelling, mass and lump, unspecified site: Secondary | ICD-10-CM

## 2020-12-26 DIAGNOSIS — C569 Malignant neoplasm of unspecified ovary: Secondary | ICD-10-CM | POA: Insufficient documentation

## 2020-12-26 NOTE — Telephone Encounter (Signed)
Pt called in requesting result of her ultrasound. Pt requesting callback as soon as possible.

## 2020-12-26 NOTE — Telephone Encounter (Signed)
Call report on US abdomen. 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 Surgical consult recommended.   Report in epic.

## 2020-12-27 ENCOUNTER — Telehealth: Payer: Self-pay | Admitting: Internal Medicine

## 2020-12-27 NOTE — Telephone Encounter (Signed)
Pt called in regards to referral that was placed to [facility]. Pt states she was called but when she called back they stated they had no record of a referral being sent.   [place] from [facility] called me prior to pts phone call. [place] stated the referral was missing the providers signature so they just needed an updated referral with the PCPs signature. Fax number is [phone]. Message has already been sent to [place] in regards to referral.  Pt was advised we did send over the referral and that there was an issue so it had to be resent to [facility]. Pt would still like a call back in regards to this.

## 2020-12-27 NOTE — Telephone Encounter (Signed)
Did you fax over this referral or was it sent electronically?

## 2020-12-27 NOTE — Telephone Encounter (Signed)
Provider signed the order. The order was faxed today 12/27/20. The patient has received her appointment for 01/02/21 @ 2:00. This will be done at [facility]. The patient is aware.

## 2020-12-30 ENCOUNTER — Telehealth: Payer: Self-pay | Admitting: Cardiology

## 2020-12-30 NOTE — Telephone Encounter (Signed)
Patient received zio but decided to delay wearing due to upcoming scans and consults at [facility] for mass   She will call to update when she finds out POC with [facility] and when she can complete cardiology testing

## 2021-01-02 DIAGNOSIS — R1900 Intra-abdominal and pelvic swelling, mass and lump, unspecified site: Secondary | ICD-10-CM | POA: Diagnosis not present

## 2021-01-03 ENCOUNTER — Other Ambulatory Visit: Payer: Self-pay | Admitting: Internal Medicine

## 2021-01-03 ENCOUNTER — Telehealth: Payer: Self-pay | Admitting: Internal Medicine

## 2021-01-03 DIAGNOSIS — C562 Malignant neoplasm of left ovary: Secondary | ICD-10-CM

## 2021-01-03 NOTE — Telephone Encounter (Signed)
Pt called in requesting result from her CT scan that she recently had. Pt is requesting urgent callback.

## 2021-01-03 NOTE — Telephone Encounter (Signed)
Patient notified of results of abd/pelvic CT by phone.  She has not been contacted yet for GYN/ONC appt . Office is trying to set her up with Dr Skeet Latch but is willing to see  a specialist in [place] if she cannot  be scheduled soon

## 2021-01-04 NOTE — Telephone Encounter (Signed)
Pt has been scheduled with a provider at [facility] on 01/11/2021.Pt is aware appt.

## 2021-01-05 DIAGNOSIS — R002 Palpitations: Secondary | ICD-10-CM

## 2021-01-10 ENCOUNTER — Encounter: Payer: Self-pay | Admitting: Internal Medicine

## 2021-01-10 DIAGNOSIS — K869 Disease of pancreas, unspecified: Secondary | ICD-10-CM | POA: Insufficient documentation

## 2021-01-11 DIAGNOSIS — C569 Malignant neoplasm of unspecified ovary: Secondary | ICD-10-CM | POA: Diagnosis not present

## 2021-01-11 DIAGNOSIS — R1900 Intra-abdominal and pelvic swelling, mass and lump, unspecified site: Secondary | ICD-10-CM | POA: Diagnosis not present

## 2021-01-11 DIAGNOSIS — R978 Other abnormal tumor markers: Secondary | ICD-10-CM | POA: Diagnosis not present

## 2021-01-12 ENCOUNTER — Ambulatory Visit: Payer: Medicare Other | Admitting: Internal Medicine

## 2021-01-13 DIAGNOSIS — R002 Palpitations: Secondary | ICD-10-CM | POA: Diagnosis not present

## 2021-01-19 ENCOUNTER — Ambulatory Visit (INDEPENDENT_AMBULATORY_CARE_PROVIDER_SITE_OTHER): Payer: Medicare Other | Admitting: Internal Medicine

## 2021-01-19 ENCOUNTER — Telehealth: Payer: Self-pay | Admitting: Internal Medicine

## 2021-01-19 ENCOUNTER — Encounter: Payer: Self-pay | Admitting: Internal Medicine

## 2021-01-19 ENCOUNTER — Other Ambulatory Visit: Payer: Self-pay

## 2021-01-19 VITALS — BP 112/74 | HR 87 | Temp 98.1°F | Ht 70.0 in | Wt 244.1 lb

## 2021-01-19 DIAGNOSIS — K869 Disease of pancreas, unspecified: Secondary | ICD-10-CM | POA: Diagnosis not present

## 2021-01-19 DIAGNOSIS — C569 Malignant neoplasm of unspecified ovary: Secondary | ICD-10-CM | POA: Diagnosis not present

## 2021-01-19 MED ORDER — HYDROCODONE-ACETAMINOPHEN 10-325 MG PO TABS
1.0000 | ORAL_TABLET | Freq: Three times a day (TID) | ORAL | 0 refills | Status: AC | PRN
Start: 2021-01-19 — End: 2021-01-24

## 2021-01-19 NOTE — Assessment & Plan Note (Addendum)
She had her initial consultation with [facility]c on Dec 28  Dr Margart Sickles  and is scheduled for a laparotomy with TAH/BSO on Feb 13 2021. All tumor markers were normal.  rx for #15 vicodin given in the event that her pain escalates prior to  Jan 30 or that her post op pain is not managed with the outlined regimen of non opioids

## 2021-01-19 NOTE — Patient Instructions (Addendum)
You can add 250 mg dose of magnesium citrate (capsule) to the stool softener if needed for constipation   CT pancreas ordered   Vicodin sent to [facility] in [place]!

## 2021-01-19 NOTE — Telephone Encounter (Signed)
Lft pt vm to call ofc to sch CT. thanks 

## 2021-01-19 NOTE — Progress Notes (Signed)
Subjective:  Patient ID: Dana Burke, female    DOB: 06/22/1945  Age: 76 y.o. MRN: [id number]  CC: The primary encounter diagnosis was Lesion of pancreas. A diagnosis of Malignant ovarian neoplasm, unspecified laterality ([place]) was also pertinent to this visit.  HPI Dana Burke presents for recent diagnosis of large pelvic /ovarian mass found during workup for RUQ abdominal pain.   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  Had GYN ONC meeting Dec 28 . Daughter went with her (also an Therapist, sports)   CT reviewed ,  pelvic exam was  done, laparotomy for TAH/BSO surgery planned,  All tumor markers CA 19-9 CEA and CA 125 have been checked and are normal.  Surgery planned for Jan 30,  and COVID testing is mandatory at [facility] the Friday before the Monday surgery.  She is moving bowels daily with use of colace.  Pain is minimal  more like discomfort    on the right upper quadant.   Additionally she has been evaluated by cardiology for palpitations with a 3 day holter monitor:  SVT ,  no atrial fib   This visit occurred during the SARS-CoV-2 public health emergency.  Safety protocols were in place, including screening questions prior to the visit, additional usage of staff PPE, and extensive cleaning of exam room while observing appropriate contact time as indicated for disinfecting solutions.   We discussed the time available and need to obtain CT of pancreas needed to investigate the  subcentimer tail lesion  noted .  (Has a right  femur rod placed 20 years ago at [facility] and unsure of the composition,  so MRI may be C/I   Reviewed Holter monitor    Outpatient Medications Prior to Visit  Medication Sig Dispense Refill   albuterol (VENTOLIN HFA) 108 (90 Base) MCG/ACT inhaler Inhale into the lungs every 6 (six) hours as needed for wheezing or shortness of breath.     benzonatate (TESSALON) 100 MG capsule Take 1 capsule (100 mg total) by mouth  3 (three) times daily as needed for cough. 30 capsule 1   buPROPion (WELLBUTRIN XL) 300 MG 24 hr tablet Take 1 tablet (300 mg total) by mouth daily. 90 tablet 2   calcium carbonate (OS-CAL) 600 MG TABS Take 600 mg by mouth 2 (two) times daily with a meal.     cetirizine (ZYRTEC) 10 MG tablet Take 10 mg by mouth at bedtime.      Cholecalciferol (VITAMIN D-3) 1000 UNITS CAPS Take 1 capsule by mouth daily.     Docusate Sodium (COLACE PO) Take 1 capsule by mouth daily.     escitalopram (LEXAPRO) 10 MG tablet TAKE 1 TABLET BY MOUTH ONCE DAILY 90 tablet 1   esomeprazole (NEXIUM) 40 MG capsule TAKE 1 CAPSULE BY MOUTH ONCE DAILY AT 12NOON 90 capsule 1   gabapentin (NEURONTIN) 300 MG capsule TAKE 1 CAPSULE BY MOUTH 4 TIMES DAILY 360 capsule 1   Melatonin 10 MG TABS Take 1 tablet by mouth.     Multiple Vitamins-Minerals (MULTIVITAMIN WITH MINERALS) tablet Take 1 tablet by mouth daily.     simvastatin (ZOCOR) 40 MG tablet Take 1 tablet (40 mg total) by mouth at bedtime. 90 tablet 1   No facility-administered medications prior to visit.    Review of Systems;  Patient denies headache, fevers, malaise, unintentional weight loss, skin rash, eye pain, sinus congestion and sinus pain, sore throat, dysphagia,  hemoptysis , cough, dyspnea, wheezing, chest pain, palpitations, orthopnea, edema, abdominal pain, nausea, melena, diarrhea, constipation, flank pain, dysuria, hematuria, urinary  Frequency, nocturia, numbness, tingling, seizures,  Focal weakness, Loss of consciousness,  Tremor, insomnia, depression, anxiety, and suicidal ideation.      Objective:  BP 112/74 (BP Location: Left Arm, Patient Position: Sitting, Cuff Size: Large)    Pulse 87    Temp 98.1 F (36.7 C) (Oral)    Ht 5\' 10"  (1.778 m)    Wt 244 lb 1.9 oz (110.7 kg)    SpO2 98%    BMI 35.03 kg/m   BP Readings from Last 3 Encounters:  01/19/21 112/74  12/22/20 110/72  12/14/20 118/84    Wt Readings from Last 3 Encounters:  01/19/21 244 lb  1.9 oz (110.7 kg)  12/22/20 244 lb (110.7 kg)  12/14/20 245 lb 12.8 oz (111.5 kg)    General appearance: alert, cooperative and appears stated age Ears: normal TM's and external ear canals both ears Throat: lips, mucosa, and tongue normal; teeth and gums normal Neck: no adenopathy, no carotid bruit, supple, symmetrical, trachea midline and thyroid not enlarged, symmetric, no tenderness/mass/nodules Back: symmetric, no curvature. ROM normal. No CVA tenderness. Lungs: clear to auscultation bilaterally Heart: regular rate and rhythm, S1, S2 normal, no murmur, click, rub or gallop Abdomen: soft, non-tender; bowel sounds normal; no masses,  no organomegaly Pulses: 2+ and symmetric Skin: Skin color, texture, turgor normal. No rashes or lesions Lymph nodes: Cervical, supraclavicular, and axillary nodes normal.  Lab Results  Component Value Date   HGBA1C 6.0 12/14/2020   HGBA1C 5.8 (H) 08/28/2018   HGBA1C 5.9 02/12/2018    Lab Results  Component Value Date   CREATININE 0.88 12/14/2020   CREATININE 0.78 08/28/2018   CREATININE 0.78 02/12/2018    Lab Results  Component Value Date   WBC 5.3 12/14/2020   HGB 14.0 12/14/2020   HCT 42.8 12/14/2020   PLT 218.0 12/14/2020   GLUCOSE 95 12/14/2020   CHOL 176 12/14/2020   TRIG 134.0 12/14/2020   HDL 38.00 (L) 12/14/2020   LDLDIRECT 160.0 12/02/2015   LDLCALC 111 (H) 12/14/2020   ALT 15 12/14/2020   AST 14 12/14/2020   NA 139 12/14/2020   K 4.4 12/14/2020   CL 105 12/14/2020   CREATININE 0.88 12/14/2020   BUN 14 12/14/2020   CO2 27 12/14/2020   TSH 1.19 12/14/2020   HGBA1C 6.0 12/14/2020   MICROALBUR 1.4 12/14/2020    US Abdomen Complete  Result Date: 12/23/2020 CLINICAL DATA:  Epigastric pain radiating to the scapula EXAM: ABDOMEN ULTRASOUND COMPLETE COMPARISON:  None. FINDINGS: Gallbladder: Surgically absent Common bile duct: Diameter: 5.2 mm Liver: Diffusely echogenic. No focal hepatic abnormality. Portal vein is patent on  color Doppler imaging with normal direction of blood flow towards the liver. IVC: No abnormality visualized. Pancreas: Not seen due to bowel gas Spleen: Size and appearance within normal limits. Right Kidney: Length: 10.7 cm. Echogenicity within normal limits. No mass or hydronephrosis visualized. Left Kidney: Length: 11.5 cm. Echogenicity is normal. No hydronephrosis. Cyst at the midpole measuring 19 mm. Abdominal aorta: No aneurysm visualized. Other findings: Incompletely visualized large complex cystic mass measuring at least 33.3 cm, arising from the pelvis. IMPRESSION: 1. Incompletely visualized large complex cystic mass measuring at least 33.3 cm, appears to be of pelvic origin, probably ovarian neoplasm. Surgical consultation is recommended. 2. Echogenic liver consistent with hepatic steatosis and or hepatocellular disease. 3. Status post cholecystectomy These results will be  called to the ordering clinician or representative by the Radiologist Assistant, and communication documented in the PACS or [employer]. Electronically Signed   By: Donavan Foil M.D.   On: 12/23/2020 21:37    Assessment & Plan:   Problem List Items Addressed This Visit     Malignant ovarian neoplasm, unspecified laterality [facility])    She had her initial consultation with [facility] on Dec 28  Dr Margart Sickles  and is scheduled for a laparotomy with TAH/BSO on Feb 13 2021. All tumor markers were normal.  rx for #15 vicodin given in the event that her pain escalates prior to  Jan 30 or that her post op pain is not managed with the outlined regimen of non opioids      Lesion of pancreas - Primary    An incidental 0.5 cm lesion in the tail of the pancreas was seen on the CT.  Given the timeframe available,  Will pursue dedicated imaging prior to her laparatomy      Relevant Orders   CT Abdomen Pelvis W Contrast   .assesstt I am having Dana Burke start on HYDROcodone-acetaminophen. I am also having her maintain  her multivitamin with minerals, calcium carbonate, Vitamin D-3, cetirizine, Docusate Sodium (COLACE PO), Melatonin, benzonatate, albuterol, escitalopram, gabapentin, buPROPion, simvastatin, and esomeprazole.  Meds ordered this encounter  Medications   HYDROcodone-acetaminophen (NORCO) 10-325 MG tablet    Sig: Take 1 tablet by mouth every 8 (eight) hours as needed for up to 5 days.    Dispense:  15 tablet    Refill:  0     I provided 40 minutes of  face-to-face time during this encounter reviewing patient's current problems and prior surgeries, labs from [facility] and recent CT scan ,  Holter monitor, surgical plan,  and  providing counseling on the above mentioned problems , a .   Follow-up: No follow-ups on file.   Crecencio Mc, MD

## 2021-01-19 NOTE — Telephone Encounter (Signed)
Patient call back to speak with Lydia Guiles, Please call patient @ [phone]

## 2021-01-20 NOTE — Assessment & Plan Note (Signed)
An incidental 0.5 cm lesion in the tail of the pancreas was seen on the CT.  Given the timeframe available,  Will pursue dedicated imaging prior to her laparatomy

## 2021-01-23 ENCOUNTER — Ambulatory Visit
Admission: RE | Admit: 2021-01-23 | Discharge: 2021-01-23 | Disposition: A | Discharge status: Home / Self Care | Payer: Medicare Other | Source: Ambulatory Visit | Attending: Internal Medicine | Admitting: Internal Medicine

## 2021-01-23 ENCOUNTER — Other Ambulatory Visit: Payer: Self-pay

## 2021-01-23 DIAGNOSIS — K869 Disease of pancreas, unspecified: Secondary | ICD-10-CM | POA: Insufficient documentation

## 2021-01-23 MED ORDER — IOHEXOL 350 MG/ML SOLN
80.0000 mL | Freq: Once | INTRAVENOUS | Status: AC | PRN
  Administered 2021-01-23: 80 mL via INTRAVENOUS

## 2021-02-02 ENCOUNTER — Telehealth: Payer: Medicare Other | Admitting: Internal Medicine

## 2021-02-02 NOTE — Telephone Encounter (Signed)
Patient called in stated she need Ct report faxed to  [facility]  the nurse to [phone], Reached out to my Manager she Laurie Panda me to fax documentation

## 2021-02-10 ENCOUNTER — Telehealth: Payer: Self-pay | Admitting: Cardiology

## 2021-02-10 DIAGNOSIS — Z6834 Body mass index (BMI) 34.0-34.9, adult: Secondary | ICD-10-CM | POA: Diagnosis not present

## 2021-02-10 DIAGNOSIS — E669 Obesity, unspecified: Secondary | ICD-10-CM | POA: Diagnosis present

## 2021-02-10 DIAGNOSIS — N841 Polyp of cervix uteri: Secondary | ICD-10-CM | POA: Diagnosis not present

## 2021-02-10 DIAGNOSIS — D271 Benign neoplasm of left ovary: Secondary | ICD-10-CM | POA: Diagnosis present

## 2021-02-10 DIAGNOSIS — D270 Benign neoplasm of right ovary: Secondary | ICD-10-CM | POA: Diagnosis present

## 2021-02-10 DIAGNOSIS — N736 Female pelvic peritoneal adhesions (postinfective): Secondary | ICD-10-CM | POA: Diagnosis not present

## 2021-02-10 DIAGNOSIS — K219 Gastro-esophageal reflux disease without esophagitis: Secondary | ICD-10-CM | POA: Diagnosis present

## 2021-02-10 DIAGNOSIS — Z86718 Personal history of other venous thrombosis and embolism: Secondary | ICD-10-CM | POA: Diagnosis not present

## 2021-02-10 DIAGNOSIS — D259 Leiomyoma of uterus, unspecified: Secondary | ICD-10-CM | POA: Diagnosis not present

## 2021-02-10 DIAGNOSIS — E785 Hyperlipidemia, unspecified: Secondary | ICD-10-CM | POA: Diagnosis present

## 2021-02-10 DIAGNOSIS — R1900 Intra-abdominal and pelvic swelling, mass and lump, unspecified site: Secondary | ICD-10-CM | POA: Diagnosis present

## 2021-02-10 DIAGNOSIS — Z9049 Acquired absence of other specified parts of digestive tract: Secondary | ICD-10-CM | POA: Diagnosis not present

## 2021-02-10 NOTE — Telephone Encounter (Signed)
Yes, no active cardiac symptoms.  Monitor was stable.  No need for Further cardiac evaluation.  Glenetta Hew, MD

## 2021-02-10 NOTE — Telephone Encounter (Signed)
° °  Pre-operative Risk Assessment    Patient Name: Dana Burke  DOB: 1945-11-24 MRN: [id number]      Request for Surgical Clearance    Procedure:   TOTAL ABDOMINAL HYSTERECTOMY (CORPUS & CERVIX), W/WO REMOVAL OF TUBE(S), W/WO REMOVAL OF OVARY(S)  Date of Surgery:  Clearance 02/13/21                                 Surgeon:  Dr. Julian Hy Surgeon's Group or Practice Name:  [facility]  Phone number:  [phone] Fax number:  [phone]   Type of Clearance Requested:   - Medical    Type of Anesthesia:  General    Additional requests/questions:   Wants to confirm heart monitor results came back okay  for procedure  SignedTrilby Drummer   02/10/2021, 12:22 PM

## 2021-02-13 DIAGNOSIS — K219 Gastro-esophageal reflux disease without esophagitis: Secondary | ICD-10-CM | POA: Diagnosis present

## 2021-02-13 DIAGNOSIS — D271 Benign neoplasm of left ovary: Secondary | ICD-10-CM | POA: Diagnosis present

## 2021-02-13 DIAGNOSIS — Z86718 Personal history of other venous thrombosis and embolism: Secondary | ICD-10-CM | POA: Diagnosis not present

## 2021-02-13 DIAGNOSIS — D270 Benign neoplasm of right ovary: Secondary | ICD-10-CM | POA: Diagnosis present

## 2021-02-13 DIAGNOSIS — R1900 Intra-abdominal and pelvic swelling, mass and lump, unspecified site: Secondary | ICD-10-CM | POA: Diagnosis present

## 2021-02-13 DIAGNOSIS — Z9049 Acquired absence of other specified parts of digestive tract: Secondary | ICD-10-CM | POA: Diagnosis not present

## 2021-02-13 DIAGNOSIS — E785 Hyperlipidemia, unspecified: Secondary | ICD-10-CM | POA: Diagnosis present

## 2021-02-13 DIAGNOSIS — Z6834 Body mass index (BMI) 34.0-34.9, adult: Secondary | ICD-10-CM | POA: Diagnosis not present

## 2021-02-13 DIAGNOSIS — E669 Obesity, unspecified: Secondary | ICD-10-CM | POA: Diagnosis present

## 2021-02-13 DIAGNOSIS — N841 Polyp of cervix uteri: Secondary | ICD-10-CM | POA: Diagnosis not present

## 2021-02-13 DIAGNOSIS — D259 Leiomyoma of uterus, unspecified: Secondary | ICD-10-CM | POA: Diagnosis not present

## 2021-02-13 DIAGNOSIS — N736 Female pelvic peritoneal adhesions (postinfective): Secondary | ICD-10-CM | POA: Diagnosis not present

## 2021-02-13 NOTE — Telephone Encounter (Signed)
° °  Primary Cardiologist: Glenetta Hew, MD  Chart reviewed as part of pre-operative protocol coverage. Given past medical history and time since last visit, based on ACC/AHA guidelines, Imajean Burt Knack Groman would be at acceptable risk for the planned procedure without further cardiovascular testing.   I will route this recommendation to the requesting party via Epic fax function and remove from pre-op pool.  Please call with questions.  Jossie Ng. Everrett Lacasse NP-C    02/13/2021, [address] [place] [address] Office [phone] Fax [phone]

## 2021-02-20 ENCOUNTER — Other Ambulatory Visit: Payer: Self-pay | Admitting: Internal Medicine

## 2021-03-08 DIAGNOSIS — R1900 Intra-abdominal and pelvic swelling, mass and lump, unspecified site: Secondary | ICD-10-CM | POA: Diagnosis not present

## 2021-03-08 DIAGNOSIS — R8271 Bacteriuria: Secondary | ICD-10-CM | POA: Diagnosis not present

## 2021-03-08 DIAGNOSIS — Z4889 Encounter for other specified surgical aftercare: Secondary | ICD-10-CM | POA: Diagnosis not present

## 2021-03-13 MED ORDER — ZOSTER VAC RECOMB ADJUVANTED 50 MCG/0.5ML IM SUSR: 0.5000 mL | Freq: Once | INTRAMUSCULAR | 1 refills | Status: AC

## 2021-03-14 ENCOUNTER — Telehealth: Payer: Self-pay

## 2021-03-14 ENCOUNTER — Ambulatory Visit (INDEPENDENT_AMBULATORY_CARE_PROVIDER_SITE_OTHER): Payer: Medicare Other | Admitting: Internal Medicine

## 2021-03-14 ENCOUNTER — Encounter: Payer: Self-pay | Admitting: Internal Medicine

## 2021-03-14 ENCOUNTER — Other Ambulatory Visit: Payer: Self-pay

## 2021-03-14 ENCOUNTER — Telehealth: Payer: Self-pay | Admitting: Internal Medicine

## 2021-03-14 VITALS — BP 112/70 | HR 78 | Temp 97.7°F | Ht 70.0 in | Wt 227.8 lb

## 2021-03-14 DIAGNOSIS — C569 Malignant neoplasm of unspecified ovary: Secondary | ICD-10-CM

## 2021-03-14 DIAGNOSIS — R918 Other nonspecific abnormal finding of lung field: Secondary | ICD-10-CM | POA: Insufficient documentation

## 2021-03-14 DIAGNOSIS — K8689 Other specified diseases of pancreas: Secondary | ICD-10-CM

## 2021-03-14 DIAGNOSIS — K869 Disease of pancreas, unspecified: Secondary | ICD-10-CM | POA: Diagnosis not present

## 2021-03-14 MED ORDER — NYSTATIN 100000 UNIT/GM EX POWD: 1.0000 "application " | Freq: Two times a day (BID) | CUTANEOUS | 0 refills | Status: DC

## 2021-03-14 MED ORDER — MAGNESIUM CITRATE 125 MG PO CAPS: 2.0000 | ORAL_CAPSULE | Freq: Every evening | ORAL | 2 refills | Status: AC

## 2021-03-14 MED ORDER — BUPROPION HCL ER (XL) 300 MG PO TB24: 300.0000 mg | ORAL_TABLET | Freq: Every day | ORAL | 2 refills | Status: DC

## 2021-03-14 MED ORDER — ESCITALOPRAM OXALATE 10 MG PO TABS: 10.0000 mg | ORAL_TABLET | Freq: Every day | ORAL | 2 refills | Status: DC

## 2021-03-14 NOTE — Telephone Encounter (Signed)
Lft pt vm to call ofc to sch MRI. thanks 

## 2021-03-14 NOTE — Assessment & Plan Note (Addendum)
S/p TAH/BSO . 2 extremely large adnexal masses removed,  Remarkably not malignant.  Path report serous cystadenofibroma

## 2021-03-14 NOTE — Assessment & Plan Note (Signed)
Incidental findings on recent CT . All subcentimeter.  Referral to Dr Patsey Berthold in process

## 2021-03-14 NOTE — Assessment & Plan Note (Addendum)
Incidental finding on recent CT for abd mass.  MRCP ordered , patient needs to check on "metal implant" in left leg placed in 2020 by [employer].  Referral to [employer]; in  in process

## 2021-03-14 NOTE — Patient Instructions (Addendum)
Magnesium citrate 250 mg dose may be all you need for managing constipation  Nystatin powder sent to pharmacy as well  Referral to Derrill Kay for the pulmonarynodules  Referral to Lucilla Lame for the pancreatic issue.  Find out if leg implant will allow you to have the MRCP I ordered    The ShingRx vaccine is now available  for free in local pharmacies and is much more protective thant Zostavaxs,  It is therefore ADVISED for all interested adults over 50 to prevent shingles

## 2021-03-14 NOTE — Telephone Encounter (Signed)
CALLED PATIENT NO ANSWER LEFT VOICEMAIL FOR A CALL BACK ? ?

## 2021-03-14 NOTE — Progress Notes (Signed)
Subjective:  Patient ID: Dana Burke, female    DOB: December 28, 1945  Age: 76 y.o. MRN: [id number]  CC: The primary encounter diagnosis was Multiple pulmonary nodules. Diagnoses of Malignant ovarian neoplasm, unspecified laterality ([place]), Pancreatic mass, and Lesion of pancreas were also pertinent to this visit.   This visit occurred during the SARS-CoV-2 public health emergency.  Safety protocols were in place, including screening questions prior to the visit, additional usage of staff PPE, and extensive cleaning of exam room while observing appropriate contact time as indicated for disinfecting solutions.    HPI Dana Burke presents for follow up on multiple issues Chief Complaint  Patient presents with   Follow-up    Follow up on abdominal mass surgery. Pt is needing referrals to Pulmonologist and GI.     Dana Burke is s/p hysterectomy and BSO for bilateral adnexal masses.  The surgery was done on jan 3 at [facility].  Path report : serous cystadenofibroma.   She does not need onc follow up  with  GYN ONC to discuss any additional treatment.[name]  LOST 25 LBS DURING THE SURGERY .  Appetite ok   Taking keflex x 7 days day 6 currently for presumed UTI presenting with bilateral flank pain.  CULTURE NEGATIVE UPON REVIEW TODAY.  Still having voiding issues  and required frequent (5) catheterizations during surgery .  Currently urinating with incomplete emptying ,  only voiding once per night .  Not drinking enough water   She feels great.  Energy level coming back. Bowels moving using Senna.  Not walking yet.   During workup for adnexal mass, a pancreatic neoplasm and multiple pulmonary nodules were  seen.  She is now in need of MRCP,  GI referral and pulmonary referral .  MRCP ordered , but she has a a "metal implant " ' in her left calf that was placed in 2000 at [facility] , not sure what it is made of.  Outpatient Medications Prior to Visit  Medication Sig Dispense Refill    acetaminophen (TYLENOL) 325 MG tablet Take by mouth.     albuterol (VENTOLIN HFA) 108 (90 Base) MCG/ACT inhaler Inhale into the lungs every 6 (six) hours as needed for wheezing or shortness of breath.     benzonatate (TESSALON) 100 MG capsule Take 1 capsule (100 mg total) by mouth 3 (three) times daily as needed for cough. 30 capsule 1   calcium carbonate (OS-CAL) 600 MG TABS Take 600 mg by mouth 2 (two) times daily with a meal.     cetirizine (ZYRTEC) 10 MG tablet Take 10 mg by mouth at bedtime.      Cholecalciferol (VITAMIN D-3) 1000 UNITS CAPS Take 1 capsule by mouth daily.     Docusate Sodium (COLACE PO) Take 1 capsule by mouth daily.     esomeprazole (NEXIUM) 40 MG capsule TAKE 1 CAPSULE BY MOUTH ONCE DAILY AT 12NOON 90 capsule 1   gabapentin (NEURONTIN) 300 MG capsule TAKE 1 CAPSULE BY MOUTH 4 TIMES DAILY 360 capsule 1   Melatonin 10 MG TABS Take 1 tablet by mouth.     Multiple Vitamins-Minerals (MULTIVITAMIN WITH MINERALS) tablet Take 1 tablet by mouth daily.     senna (SENOKOT) 8.6 MG tablet Take 2 tablets by mouth at bedtime.     simvastatin (ZOCOR) 40 MG tablet Take 1 tablet (40 mg total) by mouth at bedtime. 90 tablet 1   buPROPion (WELLBUTRIN XL) 300 MG 24 hr tablet Take 1 tablet (300 mg  total) by mouth daily. 90 tablet 2   cephALEXin (KEFLEX) 500 MG capsule Take 500 mg by mouth 4 (four) times daily.     escitalopram (LEXAPRO) 10 MG tablet TAKE 1 TABLET BY MOUTH ONCE DAILY 90 tablet 1   ibuprofen (ADVIL) 600 MG tablet Take by mouth.     No facility-administered medications prior to visit.    Review of Systems;  Patient denies headache, fevers, malaise, unintentional weight loss, skin rash, eye pain, sinus congestion and sinus pain, sore throat, dysphagia,  hemoptysis , cough, dyspnea, wheezing, chest pain, palpitations, orthopnea, edema, abdominal pain, nausea, melena, diarrhea, constipation, flank pain, dysuria, hematuria, urinary  Frequency, nocturia, numbness, tingling,  seizures,  Focal weakness, Loss of consciousness,  Tremor, insomnia, depression, anxiety, and suicidal ideation.      Objective:  BP 112/70 (BP Location: Left Arm, Patient Position: Sitting, Cuff Size: Large)    Pulse 78    Temp 97.7 F (36.5 C) (Oral)    Ht 5\' 10"  (1.778 m)    Wt 227 lb 12.8 oz (103.3 kg)    SpO2 96%    BMI 32.69 kg/m  CT Abdomen Pelvis W Contrast  Result Date: 01/23/2021 CLINICAL DATA:  76 year old female with complex 25 cm abdominopelvic mass. EXAM: CT ABDOMEN AND PELVIS WITH CONTRAST TECHNIQUE: Multidetector CT imaging of the abdomen and pelvis was performed using the standard protocol following bolus administration of intravenous contrast. CONTRAST:  43mL OMNIPAQUE IOHEXOL 350 MG/ML SOLN COMPARISON:  Abdominal ultrasound 12/23/2020, chest radiographs 09/10/2018 FINDINGS: Lower chest: There is a 9 mm ground-glass nodular density within the left lower lobe (axial image 4/4). More inferiorly there is a 3 mm ground-glass nodular opacity (axial image 14/4). There is a partially visualized possible 6 mm nodule within the medial aspect of the right middle lobe abutting the medial pleura (axial image 1/4). Just inferior to this there is curvilinear density with 2 possible additional 3-4 mm nodules seen on axial image 4. Two punctate ground-glass nodules are seen within the anterior middle lobe on axial image 2. Hepatobiliary: The liver demonstrates smooth contours. Diffuse decreased density is seen throughout the liver suggesting fatty infiltration. Status post cholecystectomy. No intrahepatic or extrahepatic biliary ductal dilatation. Pancreas: There is mild lobularity of the distal pancreatic tail which may represent the patient's normal anatomy, however there may be mildly delayed enhancement in this region, and cannot exclude a small pancreatic mass measuring up to a proximally 1.8 x 1.7 x 1.6 cm (transverse by AP by craniocaudal, axial 31/2 and coronal 68/5). No pancreatic ductal  dilatation is seen. Spleen: Normal in size without focal abnormality. Adrenals/Urinary Tract: Normal adrenals. The kidneys enhance uniformly and are symmetric in size without hydronephrosis. As seen on prior ultrasound, there is a left midpole benign simple cyst measuring up to 2.4 cm. The urinary bladder is anteriorly displaced by the partially cystic and partially solid pelvic mass described below. The soft tissue portion of this mass does abut the superior urinary bladder wall but no definite infiltration is seen. Stomach/Bowel: Mild to moderate sigmoid and descending colon and mild distal transverse colon diverticulosis without inflammatory changes to indicate acute diverticulitis. The terminal ileum is unremarkable. The appendix is not confidently identified, however no inflammatory changes are seen around the cecum to indicate secondary signs of acute appendicitis. No dilated loops of bowel to indicate bowel obstruction. Moderate sliding hiatal hernia. Vascular/Lymphatic: Aortic atherosclerosis. No enlarged abdominal or pelvic lymph nodes. Reproductive: There is a large partially cystic and partially solid mass seen  within the abdomen and pelvis, as visualized on prior ultrasound. Predominantly cystic but also partially solid components abut the posterior aspect of the uterus. The left ovarian vein appears to extend into the posterior aspect of the predominant cystic portion of the mass (axial images 52 through 67 of series 2), and this mass may be ovarian in nature, possibly involving the left ovary. The overall mass is difficult to measure given multiple lobular components, however the dominant slightly left-sided partially cystic and partially solid component measures up to approximately 18.7 by 11.4 by 25.9 cm (transverse by AP by craniocaudal, as measured on axial image 78 and coronal image 51). There is also a smaller partially cystic and partially solid component more inferiorly within the pelvis,  abutting the posterior aspect of the uterus measuring up to 8.3 x 11.8 x 11.7 cm (transverse by AP by craniocaudal). Other: No abdominal wall hernia or abnormality. No abdominopelvic ascites. Musculoskeletal: Moderate multilevel degenerative disc changes of the visualized thoracolumbar spine. No aggressive lytic or blastic osseous lesion is identified. IMPRESSION:: IMPRESSION: 1. As seen on prior ultrasound, there is a partially cystic and partially solid mass within the abdomen and pelvis. This appears to be originating from the pelvis, possibly in the left adnexa. The solid components raises suspicion for a malignant process. Soft tissue sampling may be necessary for diagnosis. 2. This dominant mass does not appear to involve the pancreas. However, as described above, there may be a tiny soft tissue mass measuring up to 1.8 cm within the distal pancreatic tail with mildly delayed enhancement. Consider MRI of the abdomen for further evaluation. 3. There are multiple nodular opacities within the visualized lung bases. The largest ground-glass nodule measures up to 9 mm and the largest possible solid nodule measures up to 6 mm. Cannot exclude metastatic disease. Please correlate with results of overall work-up of the primary pelvis mass. Electronically Signed   By: Yvonne Kendall   On: 01/23/2021 08:52    Assessment & Plan:   Problem List Items Addressed This Visit     Malignant ovarian neoplasm, unspecified laterality ([place])    S/p TAH/BSO . 2 extremely large adnexal masses removed,  Remarkably not malignant.  Path report serous cystadenofibroma      Lesion of pancreas    Incidental finding on recent CT for abd mass.  MRCP ordered , patient needs to check on "metal implant" in left leg placed in 2020 by [facility].  Referral to Lucilla Lame; in  in process       Multiple pulmonary nodules - Primary    Incidental findings on recent CT . All subcentimeter.  Referral to Dr Patsey Berthold in process       Relevant Orders   Ambulatory referral to Pulmonology   Other Visit Diagnoses     Pancreatic mass       Relevant Orders   MR ABDOMEN WITH MRCP W CONTRAST   Ambulatory referral to Gastroenterology       I spent 40 minutes dedicated to the care of this patient on the date of this encounter to include pre-visit review of patient's medical history,  most recent imaging studies, op report and path reports,  Face-to-face time with the patient , and post visit ordering of testing and therapeutics.    Follow-up: No follow-ups on file.   Crecencio Mc, MD

## 2021-03-15 ENCOUNTER — Telehealth: Payer: Self-pay

## 2021-03-15 NOTE — Telephone Encounter (Signed)
Scheduled for 06/08/2021 ?

## 2021-03-21 ENCOUNTER — Other Ambulatory Visit: Payer: Self-pay

## 2021-03-21 ENCOUNTER — Encounter: Payer: Self-pay | Admitting: Pulmonary Disease

## 2021-03-21 ENCOUNTER — Ambulatory Visit (INDEPENDENT_AMBULATORY_CARE_PROVIDER_SITE_OTHER): Payer: Medicare Other | Admitting: Pulmonary Disease

## 2021-03-21 VITALS — BP 122/70 | HR 90 | Temp 96.9°F | Ht 70.0 in | Wt 226.0 lb

## 2021-03-21 DIAGNOSIS — R918 Other nonspecific abnormal finding of lung field: Secondary | ICD-10-CM

## 2021-03-21 NOTE — Progress Notes (Signed)
Subjective:    Patient ID: Dana Burke, female    DOB: 1945/10/06, 76 y.o.   MRN: [id number]  Patient Care Team: Crecencio Mc, MD as PCP - General (Internal Medicine) Leonie Man, MD as PCP - Cardiology (Cardiology) Bary Castilla Forest Gleason, MD (General Surgery) Crecencio Mc, MD (Internal Medicine)  Chief Complaint  Patient presents with   Consult   HPI The patient is a 76 year old remote former smoker with minimal smoking history and a history as noted below, who presents for evaluation of multiple lung nodules on CT chest.  She was last seen here on on 14 October 2018 at that time was encourage to follow-up in 6 months time.  She was being followed here during that time for issues with cough and dysphonia. 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  The patient continued to follow-up with primary care.  In the interim the patient had hysterectomy and bilateral salpingo-oophorectomy for bilateral adnexal masses on 17 January 2021.  She had a serous cystoadenofibroma.  Oncologic follow-up was not necessary.  A CT performed during workup of that mass showed that she had multiple small nodules (4 mm) the largest of which was on the left.  She has not had dedicated chest CT to better characterize these findings.  The patient has been asymptomatic with regards to these findings.  She does not endorse any shortness of breath, no fevers, chills or sweats.  She had weight loss after her surgery in January but this has stabilized. She does not endorse any other symptomatology.   We reviewed the films with the patient and she was allowed to ask questions.  The answered the questions to her satisfaction.  Review of Systems A 10 point review of systems was performed and it is as noted above otherwise negative.  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  GR 1 DD.  Normal RV size and function.  Mild LA dilation.  Normal valves.   Patient Active Problem List   Diagnosis Date Noted   Multiple pulmonary nodules 03/14/2021   Lesion of pancreas 01/10/2021   Malignant ovarian neoplasm, unspecified laterality ([place]) 12/26/2020   Dysphonia 12/14/2020   Irregular heartbeat 12/14/2020   Chronic epigastric pain 12/14/2020   Prediabetes 02/13/2018   Encounter for preventive health examination 12/04/2015   Insomnia 12/04/2015   Neuropathy 06/02/2014   Major depressive disorder, single episode 05/08/2014   Screening for osteoporosis 05/08/2014   History of DVT of lower extremity 05/05/2014   Hyperlipidemia with target low density lipoprotein (LDL) cholesterol less than 100 mg/dL 05/05/2013   Medicare annual  wellness visit, subsequent 05/03/2013   Special screening for malignant neoplasms, colon 09/04/2012   Obesity 07/31/2012   History of abnormal mammogram 07/31/2012   Family history of malignant melanoma 07/31/2012   GERD (gastroesophageal reflux disease)    Family History  Problem Relation Age of Onset   Heart disease Mother    Hyperlipidemia Mother    Dementia Mother    Congestive Heart Failure Father    Hyperlipidemia Father    Diabetes Father    Heart disease Father         Status post pacemaker   Cancer Sister        melanoma   Cancer Sister        breast   Breast cancer Sister        diagnosed 3 times - lastest 26s   Cancer Brother        skin cancer   Bladder Cancer Brother    Diabetes Brother    Diabetes Brother    Coronary artery disease Brother 11       4 vessel CABG   Diabetes Brother    Cancer Paternal Uncle        colon   Seizures Grandchild    Breast cancer Cousin    Breast cancer Cousin    Breast cancer Cousin    Breast cancer Cousin    Breast cancer Cousin    Social History   Tobacco Use   Smoking status: Former    Packs/day: 0.40    Years: 2.00    Pack years: 0.80    Types: Cigarettes    Quit date: 07/30/1987    Years since quitting: 33.6   Smokeless tobacco: Never  Substance Use Topics   Alcohol use: No   No Known Allergies  Current Meds  Medication Sig   acetaminophen (TYLENOL) 325 MG tablet Take by mouth.   albuterol (VENTOLIN HFA) 108 (90 Base) MCG/ACT inhaler Inhale into the lungs every 6 (six) hours as needed for wheezing or shortness of breath.   benzonatate (TESSALON) 100 MG capsule Take 1 capsule (100 mg total) by mouth 3 (three) times daily as needed for cough.   buPROPion (WELLBUTRIN XL) 300 MG 24 hr tablet Take 1 tablet (300 mg total) by mouth daily.   calcium carbonate (OS-CAL) 600 MG TABS Take 600 mg by mouth 2 (two) times daily with a meal.   cetirizine (ZYRTEC) 10 MG tablet Take 10 mg by mouth at bedtime.    Cholecalciferol (VITAMIN D-3) 1000 UNITS CAPS Take 1 capsule by mouth daily.   Docusate Sodium (COLACE PO) Take 1 capsule by mouth daily.   escitalopram (LEXAPRO) 10 MG tablet Take 1 tablet (10 mg total) by mouth daily.   esomeprazole (NEXIUM) 40 MG capsule TAKE 1 CAPSULE BY MOUTH ONCE DAILY AT 12NOON   gabapentin (NEURONTIN) 300 MG capsule TAKE 1 CAPSULE BY MOUTH 4 TIMES DAILY   Magnesium Citrate 125 MG CAPS Take 2 capsules by mouth every evening.   Melatonin 10 MG TABS Take 1 tablet by mouth.    Multiple Vitamins-Minerals (MULTIVITAMIN WITH MINERALS) tablet Take 1 tablet by mouth daily.   nystatin (MYCOSTATIN/NYSTOP) powder Apply 1 application topically 2 (two) times daily. To rash until resolved.   simvastatin (ZOCOR) 40 MG tablet Take 1 tablet (40 mg total) by mouth at bedtime.   Immunization History  Administered Date(s) Administered   Influenza, High Dose Seasonal PF 10/29/2012, 12/02/2015, 11/28/2016, 11/04/2017, 09/26/2018   Influenza,inj,Quad PF,6+ Mos 11/03/2013   Influenza-Unspecified 10/28/2014,  09/30/2020   PFIZER(Purple Top)SARS-COV-2 Vaccination 02/07/2019, 02/28/2019, 10/15/2019, 04/29/2020   Pfizer Covid-19 Vaccine Bivalent Booster 56yr & up 09/30/2020   Pneumococcal Conjugate-13 10/29/2012   Pneumococcal Polysaccharide-23 05/05/2014   Td 08/01/1994, 06/27/2004   Tdap 11/25/2006, 05/01/2013   Zoster, Live 04/16/2006, 12/30/2007       Objective:   Physical Exam BP 122/70 (BP Location: Left Arm, Patient Position: Sitting, Cuff Size: Normal)   Pulse 90   Temp (!) 96.9 F (36.1 C) (Oral)   Ht '5\' 10"'$  (1.778 m)   Wt 226 lb (102.5 kg)   SpO2 95%   BMI 32.43 kg/m  GENERAL: Obese woman, no acute distress, fully ambulatory, no conversational dyspnea. HEAD: Normocephalic, atraumatic.  EYES: Pupils equal, round, reactive to light.  No scleral icterus.  MOUTH: Dentition intact, no thrush, oral mucosa moist. NECK: Supple. No thyromegaly. Trachea midline. No JVD.  No adenopathy. PULMONARY: Good air entry bilaterally.  No adventitious sounds. CARDIOVASCULAR: S1 and S2. Regular rate and rhythm.  No rubs, murmurs or gallops heard. ABDOMEN: Obese otherwise benign. MUSCULOSKELETAL: No joint deformity, no clubbing, no edema.  NEUROLOGIC: No overt focal deficit, no gait disturbance, speech is fluent. SKIN: Intact,warm,dry. PSYCH: Mood and behavior normal  Representative image of the abdominal CT and pelvis performed 23 January 2021 showing pulmonary nodules on the  basilar lung windows (arrows):     Assessment & Plan:     ICD-10-CM   1. Multiple lung nodules  R91.8 CT CHEST WO CONTRAST   We will get dedicated CT chest Determination on further work-up pending results of the above     Orders Placed This Encounter  Procedures   CT CHEST WO CONTRAST    Standing Status:   Future    Standing Expiration Date:   03/22/2022    Order Specific Question:   Preferred imaging location?    Answer:   [facility]   Will see the patient in follow-up in 6 to 8 weeks time after her CT chest to determine next steps.  CRenold Don MD Advanced Bronchoscopy PCCM [facility]    *This note was dictated using voice recognition software/Dragon.  Despite best efforts to proofread, errors can occur which can change the meaning. Any transcriptional errors that result from this process are unintentional and may not be fully corrected at the time of dictation.

## 2021-03-21 NOTE — Patient Instructions (Signed)
We have ordered a chest CT to evaluate your multiple lung nodules. ? ?We will see him in follow-up in 6 to 8 weeks time call sooner should any new problems arise. ?

## 2021-03-23 DIAGNOSIS — Z4889 Encounter for other specified surgical aftercare: Secondary | ICD-10-CM | POA: Diagnosis not present

## 2021-03-23 DIAGNOSIS — Z09 Encounter for follow-up examination after completed treatment for conditions other than malignant neoplasm: Secondary | ICD-10-CM | POA: Diagnosis not present

## 2021-03-30 ENCOUNTER — Other Ambulatory Visit: Payer: Self-pay | Admitting: Internal Medicine

## 2021-03-30 ENCOUNTER — Ambulatory Visit
Admission: RE | Admit: 2021-03-30 | Discharge: 2021-03-30 | Disposition: A | Discharge status: Home / Self Care | Payer: Medicare Other | Source: Ambulatory Visit | Attending: Internal Medicine | Admitting: Internal Medicine

## 2021-03-30 DIAGNOSIS — K838 Other specified diseases of biliary tract: Secondary | ICD-10-CM | POA: Diagnosis not present

## 2021-03-30 DIAGNOSIS — K8689 Other specified diseases of pancreas: Secondary | ICD-10-CM | POA: Diagnosis not present

## 2021-03-30 MED ORDER — GADOBUTROL 1 MMOL/ML IV SOLN
10.0000 mL | Freq: Once | INTRAVENOUS | Status: AC | PRN
  Administered 2021-03-30: 10 mL via INTRAVENOUS

## 2021-04-03 ENCOUNTER — Telehealth: Payer: Self-pay | Admitting: Internal Medicine

## 2021-04-03 NOTE — Telephone Encounter (Signed)
Pt called wanting to know the results of her MRI ?

## 2021-04-03 NOTE — Telephone Encounter (Signed)
Spoke with pt and informed her of her MRI results. Pt gave a verbal understanding.  ?

## 2021-04-04 ENCOUNTER — Encounter: Payer: Self-pay | Admitting: Internal Medicine

## 2021-04-04 ENCOUNTER — Ambulatory Visit (INDEPENDENT_AMBULATORY_CARE_PROVIDER_SITE_OTHER): Payer: Medicare Other | Admitting: Internal Medicine

## 2021-04-04 ENCOUNTER — Other Ambulatory Visit: Payer: Self-pay

## 2021-04-04 VITALS — BP 116/68 | HR 87 | Temp 97.8°F | Ht 70.0 in | Wt 228.6 lb

## 2021-04-04 DIAGNOSIS — F321 Major depressive disorder, single episode, moderate: Secondary | ICD-10-CM | POA: Diagnosis not present

## 2021-04-04 DIAGNOSIS — E785 Hyperlipidemia, unspecified: Secondary | ICD-10-CM | POA: Diagnosis not present

## 2021-04-04 DIAGNOSIS — Z23 Encounter for immunization: Secondary | ICD-10-CM

## 2021-04-04 DIAGNOSIS — K760 Fatty (change of) liver, not elsewhere classified: Secondary | ICD-10-CM

## 2021-04-04 DIAGNOSIS — K449 Diaphragmatic hernia without obstruction or gangrene: Secondary | ICD-10-CM

## 2021-04-04 DIAGNOSIS — N281 Cyst of kidney, acquired: Secondary | ICD-10-CM | POA: Insufficient documentation

## 2021-04-04 DIAGNOSIS — R7303 Prediabetes: Secondary | ICD-10-CM

## 2021-04-04 NOTE — Assessment & Plan Note (Signed)
Reviewed management with patient.  Low GI diet,  Weight loss,  Vaccination status against Hep a /B to be confirmed ?

## 2021-04-04 NOTE — Assessment & Plan Note (Signed)
Incidental finding,  without hydronephrosis  ?

## 2021-04-04 NOTE — Assessment & Plan Note (Signed)
Reviewed management.  She is taking Nexium and sleeping on a wedge  No further workup needed at this time  ?

## 2021-04-04 NOTE — Progress Notes (Signed)
? ?Subjective:  ?Patient ID: Dana Burke, female    DOB: [birth year]  Age: 76 y.o. MRN: [id number] ? ?CC: The primary encounter diagnosis was Need for vaccination against hepatitis A. Diagnoses of Current moderate episode of major depressive disorder without prior episode ([place]), Need for vaccination against hepatitis B virus, Hepatic steatosis, Prediabetes, Hyperlipidemia with target low density lipoprotein (LDL) cholesterol less than 100 mg/dL, Renal cyst, acquired, left, and Hiatal hernia were also pertinent to this visit. ? ? ?This visit occurred during the SARS-CoV-2 public health emergency.  Safety protocols were in place, including screening questions prior to the visit, additional usage of staff PPE, and extensive cleaning of exam room while observing appropriate contact time as indicated for disinfecting solutions.   ? ?HPI ?Dana Burke presents for  discussion of MRI results ?Chief Complaint  ?Patient presents with  ? Follow-up  ?  Follow up to discuss MRI results  ? ?HPI:  asymptomatic incidental finding of pancreatic lesion noted on recent CT of abdomen and pelvis done Feb 12 2021.  Ovarian mass noted,  with recent laparotomy done .   ? ?MRI of abd with and without contrast reviewed today, in anticipation of GI consult  ? ? ?Fatty liver:  pathophysiology and managemet reviewed.  Hep A and B  vaccines were done over ten years ago.  No alcohol.   ?  ?5 mm hyperintense lesion in the body/tail of the pancreas with a slight prominence of the distal tail with no defined mass .  Normal pancreatic duct.  Follow up CT or MRI in 3 months advised for the thickening of the tail.  Sees Dr Allen Norris in May   ? ?Renal cysts on the left up to 2 cm in size.  Not concerning  ? ?Small hiatal hernia:  has chronic GERD controlled with Nexium.  HH may have been caused by the large ovarian mass that was removed in February  ? ?Diverticulosis : no history of "itis" ? ? ? ?Outpatient Medications Prior to Visit   ?Medication Sig Dispense Refill  ? acetaminophen (TYLENOL) 325 MG tablet Take by mouth.    ? albuterol (VENTOLIN HFA) 108 (90 Base) MCG/ACT inhaler Inhale into the lungs every 6 (six) hours as needed for wheezing or shortness of breath.    ? benzonatate (TESSALON) 100 MG capsule Take 1 capsule (100 mg total) by mouth 3 (three) times daily as needed for cough. 30 capsule 1  ? buPROPion (WELLBUTRIN XL) 300 MG 24 hr tablet Take 1 tablet (300 mg total) by mouth daily. 90 tablet 2  ? calcium carbonate (OS-CAL) 600 MG TABS Take 600 mg by mouth 2 (two) times daily with a meal.    ? cetirizine (ZYRTEC) 10 MG tablet Take 10 mg by mouth at bedtime.     ? Cholecalciferol (VITAMIN D-3) 1000 UNITS CAPS Take 1 capsule by mouth daily.    ? Docusate Sodium (COLACE PO) Take 1 capsule by mouth daily.    ? escitalopram (LEXAPRO) 10 MG tablet Take 1 tablet (10 mg total) by mouth daily. 90 tablet 2  ? esomeprazole (NEXIUM) 40 MG capsule TAKE 1 CAPSULE BY MOUTH ONCE DAILY AT 12NOON 90 capsule 1  ? gabapentin (NEURONTIN) 300 MG capsule TAKE 1 CAPSULE BY MOUTH 4 TIMES DAILY 360 capsule 1  ? Melatonin 10 MG TABS Take 1 tablet by mouth.    ? Multiple Vitamins-Minerals (MULTIVITAMIN WITH MINERALS) tablet Take 1 tablet by mouth daily.    ? nystatin (MYCOSTATIN/NYSTOP) powder  Apply 1 application topically 2 (two) times daily. To rash until resolved. 15 g 0  ? simvastatin (ZOCOR) 40 MG tablet Take 1 tablet (40 mg total) by mouth at bedtime. 90 tablet 1  ? Magnesium Citrate 125 MG CAPS Take 2 capsules by mouth every evening. (Patient not taking: Reported on 04/04/2021) 180 capsule 2  ? ?No facility-administered medications prior to visit.  ? ? ?Review of Systems; ? ?Patient denies headache, fevers, malaise, unintentional weight loss, skin rash, eye pain, sinus congestion and sinus pain, sore throat, dysphagia,  hemoptysis , cough, dyspnea, wheezing, chest pain, palpitations, orthopnea, edema, abdominal pain, nausea, melena, diarrhea,  constipation, flank pain, dysuria, hematuria, urinary  Frequency, nocturia, numbness, tingling, seizures,  Focal weakness, Loss of consciousness,  Tremor, insomnia, depression, anxiety, and suicidal ideation.   ? ? ? ?Objective:  ?BP 116/68 (BP Location: Left Arm, Patient Position: Sitting, Cuff Size: Large)   Pulse 87   Temp 97.8 ?F (36.6 ?C) (Oral)   Ht '5\' 10"'$  (1.778 m)   Wt 228 lb 9.6 oz (103.7 kg)   SpO2 93%   BMI 32.80 kg/m?  ? ?BP Readings from Last 3 Encounters:  ?04/04/21 116/68  ?03/21/21 122/70  ?03/14/21 112/70  ? ? ?Wt Readings from Last 3 Encounters:  ?04/04/21 228 lb 9.6 oz (103.7 kg)  ?03/21/21 226 lb (102.5 kg)  ?03/14/21 227 lb 12.8 oz (103.3 kg)  ? ? ?General appearance: alert, cooperative and appears stated age ?Ears: normal TM's and external ear canals both ears ?Throat: lips, mucosa, and tongue normal; teeth and gums normal ?Neck: no adenopathy, no carotid bruit, supple, symmetrical, trachea midline and thyroid not enlarged, symmetric, no tenderness/mass/nodules ?Back: symmetric, no curvature. ROM normal. No CVA tenderness. ?Lungs: clear to auscultation bilaterally ?Heart: regular rate and rhythm, S1, S2 normal, no murmur, click, rub or gallop ?Abdomen: soft, non-tender; bowel sounds normal; no masses,  no organomegaly ?Pulses: 2+ and symmetric ?Skin: Skin color, texture, turgor normal. No rashes or lesions ?Lymph nodes: Cervical, supraclavicular, and axillary nodes normal. ? ?Lab Results  ?Component Value Date  ? HGBA1C 6.0 12/14/2020  ? HGBA1C 5.8 (H) 08/28/2018  ? HGBA1C 5.9 02/12/2018  ? ? ?Lab Results  ?Component Value Date  ? CREATININE 0.88 12/14/2020  ? CREATININE 0.78 08/28/2018  ? CREATININE 0.78 02/12/2018  ? ? ?Lab Results  ?Component Value Date  ? WBC 5.3 12/14/2020  ? HGB 14.0 12/14/2020  ? HCT 42.8 12/14/2020  ? PLT 218.0 12/14/2020  ? GLUCOSE 95 12/14/2020  ? CHOL 176 12/14/2020  ? TRIG 134.0 12/14/2020  ? HDL 38.00 (L) 12/14/2020  ? LDLDIRECT 160.0 12/02/2015  ? [place]  111 (H) 12/14/2020  ? ALT 15 12/14/2020  ? AST 14 12/14/2020  ? NA 139 12/14/2020  ? K 4.4 12/14/2020  ? CL 105 12/14/2020  ? CREATININE 0.88 12/14/2020  ? BUN 14 12/14/2020  ? CO2 27 12/14/2020  ? TSH 1.19 12/14/2020  ? HGBA1C 6.0 12/14/2020  ? MICROALBUR 1.4 12/14/2020  ? ? ?MR 3D Recon At Scanner ? ?Result Date: 03/31/2021 ?CLINICAL DATA:  Pancreatic lesion EXAM: MRI ABDOMEN WITHOUT AND WITH CONTRAST (INCLUDING MRCP) TECHNIQUE: Multiplanar multisequence MR imaging of the abdomen was performed both before and after the administration of intravenous contrast. Heavily T2-weighted images of the biliary and pancreatic ducts were obtained, and three-dimensional MRCP images were rendered by post processing. CONTRAST:  33m GADAVIST GADOBUTROL 1 MMOL/ML IV SOLN COMPARISON:  CT abdomen and pelvis 01/23/2021 FINDINGS: Lower chest: No acute findings. Hepatobiliary:  Liver is normal in size and contour with no suspicious mass identified. Evidence of hepatic steatosis. Gallbladder is surgically absent. No biliary ductal dilatation identified. Pancreas: 5 mm hyperintense T2 signal lesion in the body/tail the pancreas, without definite communication with the pancreatic duct or evidence of enhancement. There is slight relative prominence of the distal tail the pancreas which measures up to 1.4 cm in AP thickness, with no defined mass or altered signal/enhancement visualized. Pancreatic duct is normal caliber. Spleen:  Within normal limits in size and appearance. Adrenals/Urinary Tract: Adrenal glands appear normal. A few left renal cysts measuring up to 2 cm in size. No enhancing renal mass or hydronephrosis identified bilaterally. Stomach/Bowel: Small hiatal hernia. No bowel obstruction identified. Extensive colonic diverticulosis. Vascular/Lymphatic: No pathologically enlarged lymph nodes identified. No abdominal aortic aneurysm demonstrated. Other:  No ascites. Musculoskeletal: No suspicious bone lesions identified. IMPRESSION:  1. Slight relative thickening/prominence of the distal tail the pancreas with no defined mass appreciated. Consider follow-up CT or MRI with contrast in 3 months to ensure stability. 2. 5 mm cystic lesion in the body/tail the pan

## 2021-04-05 ENCOUNTER — Other Ambulatory Visit: Payer: Self-pay

## 2021-04-05 ENCOUNTER — Ambulatory Visit
Admission: RE | Admit: 2021-04-05 | Discharge: 2021-04-05 | Disposition: A | Discharge status: Home / Self Care | Payer: Medicare Other | Source: Ambulatory Visit | Attending: Pulmonary Disease | Admitting: Pulmonary Disease

## 2021-04-05 DIAGNOSIS — R918 Other nonspecific abnormal finding of lung field: Secondary | ICD-10-CM | POA: Diagnosis not present

## 2021-04-18 ENCOUNTER — Ambulatory Visit
Admission: RE | Admit: 2021-04-18 | Discharge: 2021-04-18 | Disposition: A | Discharge status: Home / Self Care | Payer: Medicare Other | Source: Ambulatory Visit | Attending: Internal Medicine | Admitting: Internal Medicine

## 2021-04-18 DIAGNOSIS — Z1231 Encounter for screening mammogram for malignant neoplasm of breast: Secondary | ICD-10-CM | POA: Diagnosis not present

## 2021-04-27 DIAGNOSIS — Z20822 Contact with and (suspected) exposure to covid-19: Secondary | ICD-10-CM | POA: Diagnosis not present

## 2021-05-02 ENCOUNTER — Ambulatory Visit (INDEPENDENT_AMBULATORY_CARE_PROVIDER_SITE_OTHER): Payer: Medicare Other | Admitting: Pulmonary Disease

## 2021-05-02 ENCOUNTER — Telehealth: Payer: Self-pay | Admitting: Internal Medicine

## 2021-05-02 ENCOUNTER — Encounter: Payer: Self-pay | Admitting: Pulmonary Disease

## 2021-05-02 VITALS — BP 120/70 | HR 90 | Temp 97.6°F | Ht 71.0 in | Wt 230.0 lb

## 2021-05-02 DIAGNOSIS — R918 Other nonspecific abnormal finding of lung field: Secondary | ICD-10-CM

## 2021-05-02 DIAGNOSIS — K449 Diaphragmatic hernia without obstruction or gangrene: Secondary | ICD-10-CM | POA: Diagnosis not present

## 2021-05-02 DIAGNOSIS — K219 Gastro-esophageal reflux disease without esophagitis: Secondary | ICD-10-CM | POA: Diagnosis not present

## 2021-05-02 NOTE — Telephone Encounter (Signed)
Mammogram has been mailed to pt.  ?

## 2021-05-02 NOTE — Telephone Encounter (Signed)
Pt need a copy of her mammogram mailed to her home. ?

## 2021-05-02 NOTE — Patient Instructions (Signed)
You have a CT that has been ordered and will be scheduled this will be done 3 months after your CT from March.  You will be getting a call closer to the time of the CT. ? ?We will see you in follow-up after that CT is done to discuss next steps, if any, necessary. ?

## 2021-05-02 NOTE — Progress Notes (Signed)
Subjective:    Patient ID: Dana Burke, female    DOB: 03-09-1945, 76 y.o.   MRN: [id number] Patient Care Team: Sherlene Shams, MD as PCP - General (Internal Medicine) Marykay Lex, MD as PCP - Cardiology (Cardiology) Lemar Livings Merrily Pew, MD (General Surgery) Sherlene Shams, MD (Internal Medicine)  Chief Complaint  Patient presents with   Follow-up    CT 04/05/21--no current sx.     HPI The patient is a 76 year old remote former smoker with minimal smoking history, and a history as noted below, who presents for follow-up of multiple lung nodules on CT chest.  She was last seen here on 21 March 2021 after a 3-year hiatus from the practice.  Call that previously she had been seen in 2020 followed here during that time for issues with cough and dysphonia. 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  The patient continued to follow-up with primary care.  In the interim the patient had hysterectomy and bilateral salpingo-oophorectomy for bilateral adnexal masses on 17 January 2021.  She had a serous cystoadenofibroma.  Oncologic follow-up was not necessary.  A CT performed during workup of that mass showed that she had multiple small nodules (4 mm) the largest of which was on the left.  She has not had dedicated chest CT to better characterize these findings.  The patient had been asymptomatic with regards to these findings.  She remains asymptomatic.  She does not endorse any shortness of breath, no fevers, chills or sweats.  Since her prior visit she also has been noted to have a pancreatic mass and this is being worked up the lesion appears to be benign but will need ongoing surveillance.  She does not endorse any other symptomatology.   At her prior visit on 7 March a dedicated CT chest was ordered, she had the study on 22 March.  This study shows scattered right-sided pulmonary nodules  measuring up to 4 mm and are favored to be infectious or inflammatory.  The patient also shows mild diffuse bronchial wall thickening suggestive of bronchitis and a small hiatal hernia.  Recommendation was to follow in 3 months.  I reviewed the films with the patient she is in agreement with 15-month follow-up CT.  Overall the patient feels well and looks well.  Review of Systems A 10 point review of systems was performed and it is as noted above otherwise negative.  Patient Active Problem List   Diagnosis Date Noted   Hepatic steatosis 04/04/2021   Renal cyst, acquired, left 04/04/2021   Hiatal hernia 04/04/2021   Multiple pulmonary nodules 03/14/2021   Lesion of pancreas 01/10/2021   Malignant ovarian neoplasm, unspecified laterality (HCC) 12/26/2020   Dysphonia 12/14/2020   Irregular heartbeat 12/14/2020   Chronic epigastric pain 12/14/2020   Prediabetes 02/13/2018   Encounter for preventive health examination 12/04/2015   Insomnia 12/04/2015   Neuropathy 06/02/2014   Major depressive disorder, single episode 05/08/2014   Screening for osteoporosis 05/08/2014   History of DVT of lower extremity 05/05/2014   Hyperlipidemia with target low density lipoprotein (LDL) cholesterol less than 100 mg/dL 65/78/4696   Medicare annual wellness visit, subsequent 05/03/2013   Special screening for malignant neoplasms, colon 09/04/2012   Obesity 07/31/2012   History of abnormal mammogram 07/31/2012   Family history of malignant melanoma 07/31/2012   GERD (gastroesophageal reflux disease)    Social History   Tobacco Use  Smoking status: Former    Packs/day: 0.40    Years: 2.00    Pack years: 0.80    Types: Cigarettes    Quit date: 07/30/1987    Years since quitting: 33.7   Smokeless tobacco: Never  Substance Use Topics   Alcohol use: No   No Known Allergies Current Meds  Medication Sig   acetaminophen (TYLENOL) 325 MG tablet Take by mouth.   albuterol (VENTOLIN HFA) 108 (90 Base)  MCG/ACT inhaler Inhale into the lungs every 6 (six) hours as needed for wheezing or shortness of breath.   benzonatate (TESSALON) 100 MG capsule Take 1 capsule (100 mg total) by mouth 3 (three) times daily as needed for cough.   buPROPion (WELLBUTRIN XL) 300 MG 24 hr tablet Take 1 tablet (300 mg total) by mouth daily.   calcium carbonate (OS-CAL) 600 MG TABS Take 600 mg by mouth 2 (two) times daily with a meal.   cetirizine (ZYRTEC) 10 MG tablet Take 10 mg by mouth at bedtime.    Cholecalciferol (VITAMIN D-3) 1000 UNITS CAPS Take 1 capsule by mouth daily.   Docusate Sodium (COLACE PO) Take 1 capsule by mouth daily.   escitalopram (LEXAPRO) 10 MG tablet Take 1 tablet (10 mg total) by mouth daily.   esomeprazole (NEXIUM) 40 MG capsule TAKE 1 CAPSULE BY MOUTH ONCE DAILY AT 12NOON   gabapentin (NEURONTIN) 300 MG capsule TAKE 1 CAPSULE BY MOUTH 4 TIMES DAILY   Magnesium Citrate 125 MG CAPS Take 2 capsules by mouth every evening.   Melatonin 10 MG TABS Take 1 tablet by mouth.   Multiple Vitamins-Minerals (MULTIVITAMIN WITH MINERALS) tablet Take 1 tablet by mouth daily.   nystatin (MYCOSTATIN/NYSTOP) powder Apply 1 application topically 2 (two) times daily. To rash until resolved.   simvastatin (ZOCOR) 40 MG tablet Take 1 tablet (40 mg total) by mouth at bedtime.   Immunization History  Administered Date(s) Administered   Influenza, High Dose Seasonal PF 10/29/2012, 12/02/2015, 11/28/2016, 11/04/2017, 09/26/2018   Influenza,inj,Quad PF,6+ Mos 11/03/2013   Influenza-Unspecified 10/28/2014, 09/30/2020   PFIZER(Purple Top)SARS-COV-2 Vaccination 02/07/2019, 02/28/2019, 10/15/2019, 04/29/2020   Pfizer Covid-19 Vaccine Bivalent Booster 57yrs & up 09/30/2020   Pneumococcal Conjugate-13 10/29/2012   Pneumococcal Polysaccharide-23 05/05/2014   Td 08/01/1994, 06/27/2004   Tdap 11/25/2006, 05/01/2013   Zoster, Live 04/16/2006, 12/30/2007       Objective:   Physical Exam BP 120/70 (BP Location: Left  Arm, Cuff Size: Normal)   Pulse 90   Temp 97.6 F (36.4 C) (Temporal)   Ht  (1.803 m)   Wt 230 lb (104.3 kg)   SpO2 96%   BMI 32.08 kg/m   SpO2: 96 % O2 Device: None (Room air)  GENERAL: Obese woman, no acute distress, fully ambulatory, no conversational dyspnea. HEAD: Normocephalic, atraumatic.  EYES: Pupils equal, round, reactive to light.  No scleral icterus.  MOUTH: Dentition intact, no thrush, oral mucosa moist. NECK: Supple. No thyromegaly. Trachea midline. No JVD.  No adenopathy. PULMONARY: Good air entry bilaterally.  No adventitious sounds. CARDIOVASCULAR: S1 and S2. Regular rate and rhythm.  No rubs, murmurs or gallops heard. ABDOMEN: Obese otherwise benign. MUSCULOSKELETAL: No joint deformity, no clubbing, no edema.  NEUROLOGIC: No overt focal deficit, no gait disturbance, speech is fluent. SKIN: Intact,warm,dry. PSYCH: Mood and behavior normal  Independently reviewed the CT performed 05 April 2021.     Assessment & Plan:     ICD-10-CM   1. Multiple lung nodules  R91.8    Continue observation  protocol    2. Hiatal hernia with GERD  K44.9    K21.9    Patient currently asymptomatic     Patient will be due for repeat CT chest in July.  Will see her in follow-up after the that imaging is done she is to contact us prior to that time should any new difficulties arise.  Gailen Shelter, MD Advanced Bronchoscopy PCCM [facility]    *This note was dictated using voice recognition software/Dragon.  Despite best efforts to proofread, errors can occur which can change the meaning. Any transcriptional errors that result from this process are unintentional and may not be fully corrected at the time of dictation.

## 2021-05-17 ENCOUNTER — Ambulatory Visit (INDEPENDENT_AMBULATORY_CARE_PROVIDER_SITE_OTHER): Payer: Medicare Other | Admitting: Dermatology

## 2021-05-17 DIAGNOSIS — L578 Other skin changes due to chronic exposure to nonionizing radiation: Secondary | ICD-10-CM | POA: Diagnosis not present

## 2021-05-17 DIAGNOSIS — L570 Actinic keratosis: Secondary | ICD-10-CM

## 2021-05-17 NOTE — Progress Notes (Signed)
? ?New Patient Visit ? ?Subjective  ?Dana Burke is a 76 y.o. female who presents for the following: New Patient (Initial Visit) (Patient reports a spot she noticed at right ear 6 months ago. She states that it has been scaly, sensitive, and peels off but comes back. Hx of melanoma in sister and sister's daughter. ).  She was recently found to have a benign tumor in her lung and a mass on her pancreas.  Getting that worked up now. ? ? ?The patient has spots, moles and lesions to be evaluated, some may be new or changing and the patient has concerns that these could be cancer. ? ? ?The following portions of the chart were reviewed this encounter and updated as appropriate:  ?  ?  ? ?Review of Systems:  No other skin or systemic complaints except as noted in HPI or Assessment and Plan. ? ?Objective  ?Well appearing patient in no apparent distress; mood and affect are within normal limits. ? ?A focused examination was performed including right ear helix . Relevant physical exam findings are noted in the Assessment and Plan. ? ?right ear helix x 1 ?Scaly pink papule on right mid ear helix  ? ? ? ?Assessment & Plan  ?Actinic keratosis ?right ear helix x 1 ? ?Vs [facility] ? ?Discussed Chondrodermatitis Nodularis Chronica Helicis (CN[facility] or CNH) is a common, benign inflammatory condition of the ear cartilage and overlying skin associated with very sensitive tender papule(s).  Trauma or pressure from sleeping on the ear or from cell phone use and sun damage may be exacerbating factors.  Treatment may include using a C-shaped airplane neck pillow for sleeping on the side of the head so no pressure is on the ear.  Other treatments include topical or intralesional steroids; liquid nitrogen or laser destruction; shave removal or excision.  The condition can be difficult to treat and persist or recur despite treatment.  ? ? ?Actinic keratoses are precancerous spots that appear secondary to cumulative UV radiation exposure/sun  exposure over time. They are chronic with expected duration over 1 year. A portion of actinic keratoses will progress to squamous cell carcinoma of the skin. It is not possible to reliably predict which spots will progress to skin cancer and so treatment is recommended to prevent development of skin cancer. ? ?Will recheck at next follow up ? ?Destruction of lesion - right ear helix x 1 ? ?Destruction method: cryotherapy   ?Informed consent: discussed and consent obtained   ?Lesion destroyed using liquid nitrogen: Yes   ?Region frozen until ice ball extended beyond lesion: Yes   ?Outcome: patient tolerated procedure well with no complications   ?Post-procedure details: wound care instructions given   ?Additional details:  Prior to procedure, discussed risks of blister formation, small wound, skin dyspigmentation, or rare scar following cryotherapy. Recommend Vaseline ointment to treated areas while healing. ? ? ?Actinic Damage ?- chronic, secondary to cumulative UV radiation exposure/sun exposure over time ?- diffuse erythematous macules with underlying dyspigmentation ?- Recommend daily broad spectrum sunscreen SPF 30+ to sun-exposed areas, reapply every 2 hours as needed.  ?- Recommend staying in the shade or wearing long sleeves, sun glasses (UVA+UVB protection) and wide brim hats (4-inch brim around the entire circumference of the hat). ?- Call for new or changing lesions.  ? ?Return in about 2 months (around 07/17/2021) for recheck ak vs cdnh at right ear helix . ? ? ?I, Ruthell Rummage, CMA, am acting as scribe for Brendolyn Patty, MD. ? ?  Documentation: I have reviewed the above documentation for accuracy and completeness, and I agree with the above. ? ?Brendolyn Patty MD  ? ?

## 2021-05-17 NOTE — Patient Instructions (Addendum)
Apply Vaseline or ointment daily to area at right ear daily until healed.   ? ?Actinic keratoses are precancerous spots that appear secondary to cumulative UV radiation exposure/sun exposure over time. They are chronic with expected duration over 1 year. A portion of actinic keratoses will progress to squamous cell carcinoma of the skin. It is not possible to reliably predict which spots will progress to skin cancer and so treatment is recommended to prevent development of skin cancer. ? ?Recommend daily broad spectrum sunscreen SPF 30+ to sun-exposed areas, reapply every 2 hours as needed.  ?Recommend staying in the shade or wearing long sleeves, sun glasses (UVA+UVB protection) and wide brim hats (4-inch brim around the entire circumference of the hat). ?Call for new or changing lesions.  ? ?Cryotherapy Aftercare ? ?Wash gently with soap and water everyday.   ?Apply Vaseline and Band-Aid daily until healed.  ? ? ? ?If You Need Anything After Your Visit ? ?If you have any questions or concerns for your doctor, please call our main line at [phone] and press option 4 to reach your doctor's medical assistant. If no one answers, please leave a voicemail as directed and we will return your call as soon as possible. Messages left after 4 pm will be answered the following business day.  ? ?You may also send [place] a message via MyChart. We typically respond to MyChart messages within 1-2 business days. ? ?For prescription refills, please ask your pharmacy to contact our office. Our fax number is [phone]. ? ?If you have an urgent issue when the clinic is closed that cannot wait until the next business day, you can page your doctor at the number below.   ? ?Please note that while we do our best to be available for urgent issues outside of office hours, we are not available 24/7.  ? ?If you have an urgent issue and are unable to reach [place], you may choose to seek medical care at your doctor's office, retail clinic,  urgent care center, or emergency room. ? ?If you have a medical emergency, please immediately call 911 or go to the emergency department. ? ?Pager Numbers ? ?- Dr. Nehemiah Massed: [phone] ? ?- Dr. Laurence Ferrari: [phone] ? ?- Dr. Nicole Kindred: [phone] ? ?In the event of inclement weather, please call our main line at [phone] for an update on the status of any delays or closures. ? ?Dermatology Medication Tips: ?Please keep the boxes that topical medications come in in order to help keep track of the instructions about where and how to use these. Pharmacies typically print the medication instructions only on the boxes and not directly on the medication tubes.  ? ?If your medication is too expensive, please contact our office at [phone] option 4 or send [place] a message through [place].  ? ?We are unable to tell what your co-pay for medications will be in advance as this is different depending on your insurance coverage. However, we may be able to find a substitute medication at lower cost or fill out paperwork to get insurance to cover a needed medication.  ? ?If a prior authorization is required to get your medication covered by your insurance company, please allow [place] 1-2 business days to complete this process. ? ?Drug prices often vary depending on where the prescription is filled and some pharmacies may offer cheaper prices. ? ?The website www.goodrx.com contains coupons for medications through different pharmacies. The prices here do not account for what the cost may be with help from  insurance (it may be cheaper with your insurance), but the website can give you the price if you did not use any insurance.  ?- You can print the associated coupon and take it with your prescription to the pharmacy.  ?- You may also stop by our office during regular business hours and pick up a GoodRx coupon card.  ?- If you need your prescription sent electronically to a different pharmacy, notify our office through [facility] or by phone at [phone] option 4. ? ? ? ? ?Si Usted Necesita Algo Despu?s de Su Visita ? ?Tambi?n puede enviarnos un mensaje a trav?s de MyChart. Por lo general respondemos a los mensajes de MyChart en el transcurso de 1 a 2 d?as h?biles. ? ?Para renovar recetas, por favor pida a su farmacia que se ponga en contacto con nuestra oficina. Nuestro n?mero de fax es el [phone]. ? ?Si tiene un asunto urgente cuando la cl?nica est? cerrada y que no puede esperar hasta el siguiente d?a h?bil, puede llamar/localizar a su doctor(a) al n?mero que aparece a continuaci?n.  ? ?Por favor, tenga en cuenta que aunque hacemos todo lo posible para estar disponibles para asuntos urgentes fuera del horario de oficina, no estamos disponibles las 24 horas del d?a, los 7 d?as de la semana.  ? ?Si tiene un problema urgente y no puede comunicarse con nosotros, puede optar por buscar atenci?n m?dica  en el consultorio de su doctor(a), en una cl?nica privada, en un centro de atenci?n urgente o en una sala de emergencias. ? ?Si tiene Engineer, maintenance (IT) m?dica, por favor llame inmediatamente al 911 o vaya a la sala de emergencias. ? ?N?meros de b?per ? ?- Dr. Nehemiah Massed: [phone] ? ?- Dra. Moye: [phone] ? ?- Dra. Nicole Kindred: [phone] ? ?En caso de inclemencias del tiempo, por favor llame a nuestra l?nea principal al [phone] para una actualizaci?n sobre el estado de cualquier retraso o cierre. ? ?Consejos para la medicaci?n en dermatolog?a: ?Por favor, guarde las cajas en las que vienen los medicamentos de uso t?pico para ayudarle a seguir las instrucciones sobre d?nde y c?mo usarlos. Las farmacias generalmente imprimen las instrucciones del medicamento s?lo en las cajas y no directamente en los tubos del [place].  ? ?Si su medicamento es muy caro, por favor, p?ngase en contacto con Zigmund Daniel llamando al [phone] y presione la opci?n 4 o env?enos un mensaje a trav?s de MyChart.  ? ?No podemos decirle cu?l  ser? su copago por los medicamentos por adelantado ya que esto es diferente dependiendo de la cobertura de su seguro. Sin embargo, es posible que podamos encontrar un medicamento sustituto a Electrical engineer un formulario para que el seguro cubra el medicamento que se considera necesario.  ? ?Si se requiere Ardelia Mems autorizaci?n previa para que su compa??a de seguros [place] su medicamento, por favor perm?tanos de 1 a 2 d?as h?biles para completar este proceso. ? ?Los precios de los medicamentos var?an con frecuencia dependiendo del Environmental consultant de d?nde se surte la receta y alguna farmacias pueden ofrecer precios m?s baratos. ? ?El sitio web www.goodrx.com tiene cupones para medicamentos de Airline pilot. Los precios aqu? no tienen en cuenta lo que podr?a costar con la ayuda del seguro (puede ser m?s barato con su seguro), pero el sitio web puede darle el precio si no utiliz? ning?n seguro.  ?- Puede imprimir el cup?n correspondiente y llevarlo con su receta a la farmacia.  ?- Tambi?n puede pasar por nuestra oficina durante el horario de  atenci?n regular y recoger una tarjeta de cupones de GoodRx.  ?- Si necesita que su receta se env?e electr?nicamente a Chiropodist, informe a nuestra oficina a trav?s de MyChart de [place] o por tel?fono llamando al [phone] y presione la opci?n 4. ? ?

## 2021-06-05 ENCOUNTER — Other Ambulatory Visit: Payer: Self-pay | Admitting: Internal Medicine

## 2021-06-08 ENCOUNTER — Encounter: Payer: Self-pay | Admitting: Gastroenterology

## 2021-06-08 ENCOUNTER — Ambulatory Visit (INDEPENDENT_AMBULATORY_CARE_PROVIDER_SITE_OTHER): Payer: Medicare Other | Admitting: Gastroenterology

## 2021-06-08 DIAGNOSIS — K869 Disease of pancreas, unspecified: Secondary | ICD-10-CM

## 2021-06-08 NOTE — Patient Instructions (Addendum)
MRI has been scheduled  for June 30, 2021, at [facility] entrance arrive 8:30am  You will need to arrive at to register. You can not have anything to eat or drink.  If you need to cancel or reschedule, please call scheduling directly at [phone]

## 2021-06-08 NOTE — Progress Notes (Signed)
Gastroenterology Consultation  Referring Provider:     Crecencio Mc, MD Primary Care Physician:  Crecencio Mc, MD Primary Gastroenterologist:  Dr. Allen Norris     Reason for Consultation:     Abnormal imaging of the pancreas        HPI:   Dana Burke is a 76 y.o. y/o female referred for consultation & management of abnormal imaging of the pancreas by Dr. Derrel Nip, Aris Everts, MD. this patient comes in today after having imaging in the past that showed her to have a large pelvic mass that was 33 cm and she had surgery for removal of this.  It turns out that this was a benign lesion.  The patient was found to have a 5 mm cystic lesion at the tail/body of the pancreas.  The patient was recommended to have a CT scan with pancreatic protocol or an MRI for this cyst.  The patient was also found to have thickening/prominence of the distal tail of the pancreas with no defined mass and a repeat MRI/CT scan with contrast was recommended in 3 months to ensure stability.  The patient MRI showed:  IMPRESSION: 1. Slight relative thickening/prominence of the distal tail the pancreas with no defined mass appreciated. Consider follow-up CT or MRI with contrast in 3 months to ensure stability. 2. 5 mm cystic lesion in the body/tail the pancreas. Recommend follow up pre and post contrast MRI/MRCP or pancreatic protocol CT in 2 years. This recommendation follows ACR consensus guidelines: Management of Incidental Pancreatic Cysts: A White Paper of the ACR Incidental Findings Committee. J Am Coll Radiol 1829;93:716-967. 3. Hepatic steatosis. 4. Small hiatal hernia. 5. Extensive colonic diverticulosis.  The patient CT scan showed:  IMPRESSION: 1. As seen on prior ultrasound, there is a partially cystic and partially solid mass within the abdomen and pelvis. This appears to be originating from the pelvis, possibly in the left adnexa. The solid components raises suspicion for a malignant process.  Soft tissue sampling may be necessary for diagnosis. 2. This dominant mass does not appear to involve the pancreas. However, as described above, there may be a tiny soft tissue mass measuring up to 1.8 cm within the distal pancreatic tail with mildly delayed enhancement. Consider MRI of the abdomen for further evaluation. 3. There are multiple nodular opacities within the visualized lung bases. The largest ground-glass nodule measures up to 9 mm and the largest possible solid nodule measures up to 6 mm. Cannot exclude metastatic disease. Please correlate with results of overall work-up of the primary pelvis mass.  Patient denies any abdominal pain at the present time although she states that she was having some right-sided abdominal pain intermittently but nowhere near where the pancreatic lesion is.  There is no report of any nausea vomiting fevers chills black stools or bloody stools.  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  GR 1 DD.  Normal RV size and function.  Mild LA dilation.  Normal valves.    Prior to Admission medications   Medication Sig Start Date End Date Taking? Authorizing Provider  acetaminophen (TYLENOL) 325 MG tablet Take by mouth. 02/16/21   [provider]  albuterol (VENTOLIN HFA) 108 (90 Base) MCG/ACT inhaler Inhale into the lungs every 6 (six) hours as needed for wheezing or shortness of breath.    [provider]  benzonatate (TESSALON) 100 MG capsule Take 1 capsule (100 mg total) by mouth 3 (three) times daily as needed for cough. 08/27/17   Jodelle Green, FNP  buPROPion (WELLBUTRIN XL) 300 MG 24 hr tablet Take 1 tablet (300 mg total) by mouth daily. 03/14/21   Crecencio Mc, MD  calcium carbonate (OS-CAL) 600 MG TABS Take 600 mg by mouth 2 (two) times daily with a meal.    [provider]  cetirizine (ZYRTEC) 10 MG tablet Take 10 mg by mouth at bedtime.     [provider]  Cholecalciferol (VITAMIN D-3) 1000 UNITS CAPS Take 1 capsule by mouth daily.    [provider]  Docusate Sodium (COLACE PO) Take 1 capsule by mouth daily.    [provider]  escitalopram (LEXAPRO) 10 MG tablet Take 1 tablet (10 mg total) by mouth daily. 03/14/21   Crecencio Mc, MD  esomeprazole (NEXIUM) 40 MG capsule TAKE 1 CAPSULE BY MOUTH ONCE DAILY AT NOON 06/05/21   Crecencio Mc, MD  gabapentin (NEURONTIN) 300 MG capsule TAKE 1 CAPSULE BY MOUTH 4 TIMES DAILY 02/20/21   Crecencio Mc, MD  Magnesium Citrate 125 MG CAPS Take 2 capsules by mouth every evening. 03/14/21   Crecencio Mc, MD  Melatonin 10 MG TABS Take 1 tablet by mouth.    [provider]  Multiple Vitamins-Minerals (MULTIVITAMIN WITH MINERALS) tablet Take 1 tablet by mouth daily.    [provider]  nystatin (MYCOSTATIN/NYSTOP) powder Apply 1 application topically 2 (two) times daily. To rash until resolved. 03/14/21   Crecencio Mc, MD  simvastatin (ZOCOR) 40 MG tablet TAKE 1 TABLET BY MOUTH AT BEDTIME *DOSE INCREASE* 06/05/21   Crecencio Mc, MD    Family History  Problem Relation Age of Onset   Heart disease Mother    Hyperlipidemia Mother    Dementia Mother    Congestive Heart Failure Father    Hyperlipidemia Father     Diabetes Father    Heart disease Father        Status post pacemaker   Cancer Sister        melanoma   Cancer Sister        breast   Breast cancer Sister        diagnosed 3 times - lastest 51s   Cancer Brother        skin cancer   Bladder Cancer Brother    Diabetes Brother    Diabetes Brother    Coronary artery disease Brother 33       4 vessel CABG   Diabetes Brother    Cancer Paternal Uncle        colon   Seizures Grandchild    Breast cancer Cousin    Breast cancer Cousin  Breast cancer Cousin    Breast cancer Cousin    Breast cancer Cousin      Social History   Tobacco Use   Smoking status: Former    Packs/day: 0.40    Years: 2.00    Pack years: 0.80    Types: Cigarettes    Quit date: 07/30/1987    Years since quitting: 33.8   Smokeless tobacco: Never  Vaping Use   Vaping Use: Never used  Substance Use Topics   Alcohol use: No   Drug use: No    Allergies as of 06/08/2021   (No Known Allergies)    Review of Systems:    All systems reviewed and negative except where noted in HPI.   Physical Exam:  There were no vitals taken for this visit. No LMP recorded. Patient is postmenopausal. General:   Alert,  Well-developed, well-nourished, pleasant and cooperative in NAD Head:  Normocephalic and atraumatic. Eyes:  Sclera clear, no icterus.   Conjunctiva pink. Ears:  Normal auditory acuity. Neck:  Supple; no masses or thyromegaly. Lungs:  Respirations even and unlabored.  Clear throughout to auscultation.   No wheezes, crackles, or rhonchi. No acute distress. Heart:  Regular rate and rhythm; no murmurs, clicks, rubs, or gallops. Abdomen:  Normal bowel sounds.  No bruits.  Soft, non-tender and non-distended without masses, hepatosplenomegaly or hernias noted.  No guarding or rebound tenderness.  Negative Carnett sign.   Rectal:  Deferred.  Pulses:  Normal pulses noted. Extremities:  No clubbing or edema.  No cyanosis. Neurologic:  Alert and oriented x3;   grossly normal neurologically. Skin:  Intact without significant lesions or rashes.  No jaundice. Lymph Nodes:  No significant cervical adenopathy. Psych:  Alert and cooperative. Normal mood and affect.  Imaging Studies: No results found.  Assessment and Plan:   Dana Burke is a 76 y.o. y/o female comes in with a 5 mm lesion in the pancreas that was recommended to have repeat imaging in 2 years but a fullness in the pancreatic body/tail and repeat imaging was recommended in 3 months.  The patient has been explained the plan and agrees with it.  She will be contacted with the results of the repeat MRI of the pancreas fullness.   Lucilla Lame, MD. Marval Regal    Note: This dictation was prepared with Dragon dictation along with smaller phrase technology. Any transcriptional errors that result from this process are unintentional.

## 2021-06-16 ENCOUNTER — Other Ambulatory Visit (INDEPENDENT_AMBULATORY_CARE_PROVIDER_SITE_OTHER): Payer: Medicare Other

## 2021-06-16 DIAGNOSIS — R7303 Prediabetes: Secondary | ICD-10-CM | POA: Diagnosis not present

## 2021-06-16 DIAGNOSIS — Z23 Encounter for immunization: Secondary | ICD-10-CM | POA: Diagnosis not present

## 2021-06-16 DIAGNOSIS — E785 Hyperlipidemia, unspecified: Secondary | ICD-10-CM

## 2021-06-16 DIAGNOSIS — K760 Fatty (change of) liver, not elsewhere classified: Secondary | ICD-10-CM | POA: Diagnosis not present

## 2021-06-16 LAB — COMPREHENSIVE METABOLIC PANEL
ALT: 12 U/L (ref 0–35)
AST: 12 U/L (ref 0–37)
Albumin: 3.8 g/dL (ref 3.5–5.2)
Alkaline Phosphatase: 76 U/L (ref 39–117)
BUN: 12 mg/dL (ref 6–23)
CO2: 27 mEq/L (ref 19–32)
Calcium: 9.8 mg/dL (ref 8.4–10.5)
Chloride: 108 mEq/L (ref 96–112)
Creatinine, Ser: 0.84 mg/dL (ref 0.40–1.20)
GFR: 66.66 mL/min (ref >60.00)
Glucose, Bld: 103 mg/dL — ABNORMAL HIGH (ref 70–99)
Potassium: 4.4 mEq/L (ref 3.5–5.1)
Sodium: 141 mEq/L (ref 135–145)
Total Bilirubin: 0.4 mg/dL (ref 0.2–1.2)
Total Protein: 6.2 g/dL (ref 6.0–8.3)

## 2021-06-16 LAB — HEMOGLOBIN A1C: Hgb A1c MFr Bld: 6 % (ref 4.6–6.5)

## 2021-06-19 LAB — LIPID PANEL W/REFLEX DIRECT LDL
Cholesterol: 132 mg/dL (ref <200)
HDL: 42 mg/dL — ABNORMAL LOW (ref >=50)
LDL Cholesterol (Calc): 72 mg/dL (calc)
Non-HDL Cholesterol (Calc): 90 mg/dL (calc) (ref <130)
Total CHOL/HDL Ratio: 3.1 (calc) (ref <5.0)
Triglycerides: 101 mg/dL (ref <150)

## 2021-06-19 LAB — HEPATITIS B SURFACE ANTIBODY,QUALITATIVE: Hep B S Ab: NONREACTIVE

## 2021-06-19 LAB — HEPATITIS A ANTIBODY, TOTAL: Hepatitis A AB,Total: NONREACTIVE

## 2021-06-30 ENCOUNTER — Ambulatory Visit
Admission: RE | Admit: 2021-06-30 | Discharge: 2021-06-30 | Disposition: A | Discharge status: Home / Self Care | Payer: Medicare Other | Source: Ambulatory Visit | Attending: Gastroenterology | Admitting: Gastroenterology

## 2021-06-30 DIAGNOSIS — K5730 Diverticulosis of large intestine without perforation or abscess without bleeding: Secondary | ICD-10-CM | POA: Diagnosis not present

## 2021-06-30 DIAGNOSIS — K862 Cyst of pancreas: Secondary | ICD-10-CM | POA: Diagnosis not present

## 2021-06-30 DIAGNOSIS — K449 Diaphragmatic hernia without obstruction or gangrene: Secondary | ICD-10-CM | POA: Diagnosis not present

## 2021-06-30 DIAGNOSIS — N281 Cyst of kidney, acquired: Secondary | ICD-10-CM | POA: Diagnosis not present

## 2021-06-30 DIAGNOSIS — K869 Disease of pancreas, unspecified: Secondary | ICD-10-CM | POA: Insufficient documentation

## 2021-06-30 MED ORDER — GADOBUTROL 1 MMOL/ML IV SOLN
10.0000 mL | Freq: Once | INTRAVENOUS | Status: AC | PRN
  Administered 2021-06-30: 10 mL via INTRAVENOUS

## 2021-07-04 ENCOUNTER — Telehealth: Payer: Self-pay | Admitting: Gastroenterology

## 2021-07-04 ENCOUNTER — Ambulatory Visit (INDEPENDENT_AMBULATORY_CARE_PROVIDER_SITE_OTHER): Payer: Medicare Other

## 2021-07-04 VITALS — Ht 71.0 in | Wt 230.0 lb

## 2021-07-04 DIAGNOSIS — Z0000 Encounter for general adult medical examination without abnormal findings: Secondary | ICD-10-CM

## 2021-07-04 NOTE — Patient Instructions (Addendum)
  Dana Burke , Thank you for taking time to come for your Medicare Wellness Visit. I appreciate your ongoing commitment to your health goals. Please review the following plan we discussed and let me know if I can assist you in the future.   These are the goals we discussed:  Goals      Increase physical activity        This is a list of the screening recommended for you and due dates:  Health Maintenance  Topic Date Due   Zoster (Shingles) Vaccine (1 of 2) 10/04/2021*   Flu Shot  08/15/2021   Mammogram  04/19/2022   Tetanus Vaccine  05/02/2023   Pneumonia Vaccine  Completed   DEXA scan (bone density measurement)  Completed   COVID-19 Vaccine  Completed   Hepatitis C Screening: USPSTF Recommendation to screen - Ages 35-79 yo.  Completed   HPV Vaccine  Aged Out   Colon Cancer Screening  Discontinued  *Topic was postponed. The date shown is not the original due date.

## 2021-07-04 NOTE — Telephone Encounter (Signed)
Patient left vm wanting results of recent imaging results that Dr Allen Norris had ordered. Requesting a call back.

## 2021-07-04 NOTE — Progress Notes (Signed)
Subjective:   Dana Burke is a 76 y.o. female who presents for Medicare Annual (Subsequent) preventive examination.  Review of Systems    No ROS.  Medicare Wellness Virtual Visit.  Visual/audio telehealth visit, UTA vital signs.   See social history for additional risk factors.   Cardiac Risk Factors include: advanced age (>72mn, >>4women)     Objective:    Today's Vitals   07/04/21 0945  Weight: 230 lb (104.3 kg)  Height: '5\' 11"'$  (1.803 m)   Body mass index is 32.08 kg/m.     07/04/2021    9:51 AM 01/29/2019   11:44 AM 09/18/2018    9:42 AM 04/23/2017   10:29 AM 03/19/2016    9:38 AM 06/22/2015    3:43 PM 06/20/2015   12:47 PM  Advanced Directives  Does Patient Have a Medical Advance Directive? Yes Yes No Yes Yes Yes Yes  Type of AParamedicof [place] will [place] will  [place]Living will [place] BeachLiving will [place]Living will HShelter CoveLiving will  Does patient want to make changes to medical advance directive? No - Patient declined No - Patient declined  No - Patient declined No - Patient declined No - Patient declined   Copy of [place] Chart? No - copy requested No - copy requested  No - copy requested No - copy requested No - copy requested No - copy requested    Current Medications (verified) Outpatient Encounter Medications as of 07/04/2021  Medication Sig   acetaminophen (TYLENOL) 325 MG tablet Take by mouth.   albuterol (VENTOLIN HFA) 108 (90 Base) MCG/ACT inhaler Inhale into the lungs every 6 (six) hours as needed for wheezing or shortness of breath.   benzonatate (TESSALON) 100 MG capsule Take 1 capsule (100 mg total) by mouth 3 (three) times daily as needed for cough.   buPROPion (WELLBUTRIN XL) 300 MG 24 hr tablet Take 1 tablet (300 mg total) by mouth daily.   calcium carbonate (OS-CAL) 600 MG  TABS Take 600 mg by mouth 2 (two) times daily with a meal.   cetirizine (ZYRTEC) 10 MG tablet Take 10 mg by mouth at bedtime.    Cholecalciferol (VITAMIN D-3) 1000 UNITS CAPS Take 1 capsule by mouth daily.   Docusate Sodium (COLACE PO) Take 1 capsule by mouth daily.   escitalopram (LEXAPRO) 10 MG tablet Take 1 tablet (10 mg total) by mouth daily.   esomeprazole (NEXIUM) 40 MG capsule TAKE 1 CAPSULE BY MOUTH ONCE DAILY AT NOON   gabapentin (NEURONTIN) 300 MG capsule TAKE 1 CAPSULE BY MOUTH 4 TIMES DAILY   Magnesium Citrate 125 MG CAPS Take 2 capsules by mouth every evening.   Melatonin 10 MG TABS Take 1 tablet by mouth.   Multiple Vitamins-Minerals (MULTIVITAMIN WITH MINERALS) tablet Take 1 tablet by mouth daily.   nystatin (MYCOSTATIN/NYSTOP) powder Apply 1 application topically 2 (two) times daily. To rash until resolved.   simvastatin (ZOCOR) 40 MG tablet TAKE 1 TABLET BY MOUTH AT BEDTIME *DOSE INCREASE*   No facility-administered encounter medications on file as of 07/04/2021.    Allergies (verified) Patient has no known allergies.   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  GR 1 DD.  Normal RV size and function.  Mild LA dilation.  Normal valves.   Family History  Problem  Relation Age of Onset   Heart disease Mother    Hyperlipidemia Mother    Dementia Mother    Hypertension Father    Congestive Heart Failure Father    Hyperlipidemia Father    Diabetes Father    Heart disease Father        Status post pacemaker   Cancer Sister        melanoma   Cancer Sister        breast   Breast cancer Sister        diagnosed 3 times - lastest 77s   Hypertension Brother    Cancer Brother        skin cancer   Bladder Cancer Brother    Diabetes Brother    Hypertension Brother    Diabetes Brother    Coronary artery disease Brother 31       4 vessel CABG   Diabetes Brother    Cancer Paternal Uncle        colon   Hypertension Paternal Grandmother    Seizures Grandchild    Breast cancer Cousin    Breast cancer Cousin    Breast cancer Cousin    Breast cancer Cousin    Breast cancer Cousin    Social History   Socioeconomic History   Marital status: Widowed    Spouse name: Not on file   Number of children: Not on file   Years of education: Not on file   Highest education level: Not on file  Occupational History   Not on file  Tobacco Use   Smoking status: Former    Packs/day: 0.40    Years: 2.00    Total pack years: 0.80    Types: Cigarettes    Quit date: 07/30/1987    Years since quitting: 33.9   Smokeless tobacco: Never  Vaping Use   Vaping Use: Never used  Substance and Sexual Activity   Alcohol use: No   Drug use: No   Sexual activity: Never  Other Topics Concern   Not on file  Social History Narrative   She is a retired Marine scientist.   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 -- Now is helping her brother care for his wife - now on Hospice.   Social Determinants of Health   Financial Resource Strain: Low Risk  (01/29/2019)   Overall Financial Resource Strain (CARDIA)    Difficulty of Paying Living Expenses: Not hard at all  Food Insecurity: No Food Insecurity (07/04/2021)   Hunger Vital Sign    Worried  About Running Out of Food in the Last Year: Never true    Ran Out of Food in the Last Year: Never true  Transportation Needs: No  Transportation Needs (01/29/2019)   PRAPARE - Hydrologist (Medical): No    Lack of Transportation (Non-Medical): No  Physical Activity: Unknown (04/23/2017)   Exercise Vital Sign    Days of Exercise per Week: 0 days    Minutes of Exercise per Session: Not on file  Stress: No Stress Concern Present (01/29/2019)   [place]    Feeling of Stress : Not at all  Social Connections: Unknown (01/29/2019)   Social Connection and Isolation Panel [NHANES]    Frequency of Communication with Friends and Family: More than three times a week    Frequency of Social Gatherings with Friends and Family: More than three times a week    Attends Religious Services: 1 to 4 times per year    Active Member of [employer] or Organizations: Yes    Attends Archivist Meetings: Not on file    Marital Status: Not on file    Tobacco Counseling Counseling given: Not Answered   Clinical Intake:  Pre-visit preparation completed: Yes        Diabetes: No  How often do you need to have someone help you when you read instructions, pamphlets, or other written materials from your doctor or pharmacy?: 1 - Never    Interpreter Needed?: No      Activities of Daily Living    07/04/2021    9:53 AM  In your present state of health, do you have any difficulty performing the following activities:  Hearing? 0  Vision? 0  Difficulty concentrating or making decisions? 0  Walking or climbing stairs? 0  Dressing or bathing? 0  Doing errands, shopping? 0  Preparing Food and eating ? N  Using the Toilet? N  In the past six months, have you accidently leaked urine? N  Do you have problems with loss of bowel control? N  Managing your Medications? N  Managing your Finances? N  Housekeeping or  managing your Housekeeping? N    Patient Care Team: Crecencio Mc, MD as PCP - General (Internal Medicine) Leonie Man, MD as PCP - Cardiology (Cardiology) Bary Castilla Forest Gleason, MD (General Surgery) Crecencio Mc, MD (Internal Medicine)  Indicate any recent Medical Services you may have received from other than [facility] providers in the past year (date may be approximate).     Assessment:   This is a routine wellness examination for Ethelyne.  Virtual Visit via Telephone Note  I connected with  Kiauna Zywicki Weihe on 07/04/21 at  9:45 AM EDT by telephone and verified that I am speaking with the correct person using two identifiers.  Persons participating in the virtual visit: patient/Nurse Health Advisor   I discussed the limitations of performing an evaluation and management service by telehealth. We continued and completed visit with audio only. Some vital signs may be absent or patient reported.   Hearing/Vision screen Hearing Screening - Comments:: Patient is able to hear conversational tones without difficulty. No issues reported. Vision Screening - Comments:: Followed by [facility] Wears corrective lenses Annual visits Cataract extraction, bilateral  They have regular follow up with the ophthalmologist  Dietary issues and exercise activities discussed: Current Exercise Habits: Home exercise routine Regular diet Good water intake   Goals Addressed             This Visit's Progress    Increase physical activity         Depression Screen  07/04/2021    9:56 AM 04/04/2021    1:26 PM 03/13/2021    4:53 PM 12/14/2020   10:33 AM 01/29/2019   11:46 AM 08/13/2018    8:27 AM 07/24/2018    3:49 PM  PHQ 2/9 Scores  PHQ - 2 Score 1 0 0 2 0 0 0  PHQ- 9 Score    4  4 0    Fall Risk    07/04/2021    9:52 AM 04/04/2021    1:26 PM 03/13/2021    4:53 PM 12/14/2020   10:33 AM 01/29/2019   11:46 AM  Fall Risk   Falls in the past year? 0 0 0 0 0  Number falls in past  yr: 0      Risk for fall due to :  No Fall Risks No Fall Risks No Fall Risks   Follow up Falls evaluation completed Falls evaluation completed Falls evaluation completed Falls evaluation completed Falls prevention discussed    FALL RISK PREVENTION PERTAINING TO THE HOME: Home free of loose throw rugs in walkways, pet beds, electrical cords, etc? Yes  Adequate lighting in your home to reduce risk of falls? Yes   ASSISTIVE DEVICES UTILIZED TO PREVENT FALLS: Life alert? Yes  Use of a cane, walker or w/c? No  Grab bars in the bathroom? No  Shower chair or bench in shower? Yes  Elevated toilet seat or a handicapped toilet? No   TIMED UP AND GO: Was the test performed? No .   Cognitive Function: Patient is alert and oriented x3.  Enjoys cross stitching and other brain health stimulators.      04/23/2017   10:31 AM 03/19/2016    9:44 AM  MMSE - Mini Mental State Exam  Orientation to time 5 5  Orientation to Place 5 5  Registration 3 3  Attention/ Calculation 5 5  Recall 3 3  Language- name 2 objects 2 2  Language- repeat 1 1  Language- follow 3 step command 3 3  Language- read & follow direction 1 1  Write a sentence 1 1  Copy design 1 1  Total score 30 30        01/29/2019   11:51 AM  6CIT Screen  What Year? 0 points  What month? 0 points  What time? 0 points  Count back from 20 0 points  Months in reverse 0 points  Repeat phrase 0 points  Total Score 0 points    Immunizations Immunization History  Administered Date(s) Administered   Influenza, High Dose Seasonal PF 10/29/2012, 12/02/2015, 11/28/2016, 11/04/2017, 09/26/2018   Influenza,inj,Quad PF,6+ Mos 11/03/2013   Influenza-Unspecified 10/28/2014, 09/30/2020   PFIZER(Purple Top)SARS-COV-2 Vaccination 02/07/2019, 02/28/2019, 10/15/2019, 04/29/2020   Pfizer Covid-19 Vaccine Bivalent Booster 17yr & up 09/30/2020   Pneumococcal Conjugate-13 10/29/2012   Pneumococcal Polysaccharide-23 05/05/2014   Td 08/01/1994,  06/27/2004   Tdap 11/25/2006, 05/01/2013   Zoster, Live 04/16/2006, 12/30/2007   Shingrix Completed?: No.    Education has been provided regarding the importance of this vaccine. Patient has been advised to call insurance company to determine out of pocket expense if they have not yet received this vaccine. Advised may also receive vaccine at local pharmacy or Health Dept. Verbalized acceptance and understanding.  Screening Tests Health Maintenance  Topic Date Due   Zoster Vaccines- Shingrix (1 of 2) 10/04/2021 (Originally 04/05/1964)   INFLUENZA VACCINE  08/15/2021   MAMMOGRAM  04/19/2022   TETANUS/TDAP  05/02/2023   Pneumonia Vaccine 76 Years old  Completed   DEXA SCAN  Completed   COVID-19 Vaccine  Completed   Hepatitis C Screening  Completed   HPV VACCINES  Aged Out   COLONOSCOPY (Pts 45-43yr Insurance coverage will need to be confirmed)  Discontinued   Health Maintenance There are no preventive care reminders to display for this patient.  Lung Cancer Screening: (Low Dose CT Chest recommended if Age 78-80years, 30 pack-year currently smoking OR have quit w/in 15years.) does not qualify.   Vision Screening: Recommended annual ophthalmology exams for early detection of glaucoma and other disorders of the eye.  Dental Screening: Recommended annual dental exams for proper oral hygiene.  Community Resource Referral / Chronic Care Management: CRR required this visit?  No   CCM required this visit?  No      Plan:   Keep all routine maintenance appointments.   I have personally reviewed and noted the following in the patient's chart:   Medical and social history Use of alcohol, tobacco or illicit drugs  Current medications and supplements including opioid prescriptions.  Functional ability and status Nutritional status Physical activity Advanced directives List of other physicians Hospitalizations, surgeries, and ER visits in previous 12 months Vitals Screenings to  include cognitive, depression, and falls Referrals and appointments  In addition, I have reviewed and discussed with patient certain preventive protocols, quality metrics, and best practice recommendations. A written personalized care plan for preventive services as well as general preventive health recommendations were provided to patient.     OVarney Biles LPN   [id number]

## 2021-07-06 ENCOUNTER — Telehealth: Payer: Self-pay

## 2021-07-06 DIAGNOSIS — Z961 Presence of intraocular lens: Secondary | ICD-10-CM | POA: Diagnosis not present

## 2021-07-06 DIAGNOSIS — H43393 Other vitreous opacities, bilateral: Secondary | ICD-10-CM | POA: Diagnosis not present

## 2021-07-06 DIAGNOSIS — H04123 Dry eye syndrome of bilateral lacrimal glands: Secondary | ICD-10-CM | POA: Diagnosis not present

## 2021-07-06 DIAGNOSIS — H524 Presbyopia: Secondary | ICD-10-CM | POA: Diagnosis not present

## 2021-07-06 NOTE — Telephone Encounter (Signed)
mailed

## 2021-07-06 NOTE — Telephone Encounter (Signed)
Patient states she would like to have a copy of her MRI mailed to her home address.

## 2021-07-21 DIAGNOSIS — R0602 Shortness of breath: Secondary | ICD-10-CM | POA: Diagnosis not present

## 2021-07-21 DIAGNOSIS — G4733 Obstructive sleep apnea (adult) (pediatric): Secondary | ICD-10-CM | POA: Diagnosis not present

## 2021-07-22 DIAGNOSIS — R0602 Shortness of breath: Secondary | ICD-10-CM | POA: Diagnosis not present

## 2021-07-22 DIAGNOSIS — G4733 Obstructive sleep apnea (adult) (pediatric): Secondary | ICD-10-CM | POA: Diagnosis not present

## 2021-07-25 ENCOUNTER — Ambulatory Visit (INDEPENDENT_AMBULATORY_CARE_PROVIDER_SITE_OTHER): Payer: Medicare Other | Admitting: Dermatology

## 2021-07-25 DIAGNOSIS — L570 Actinic keratosis: Secondary | ICD-10-CM

## 2021-07-25 DIAGNOSIS — L219 Seborrheic dermatitis, unspecified: Secondary | ICD-10-CM | POA: Diagnosis not present

## 2021-07-25 MED ORDER — HYDROCORTISONE 2.5 % EX CREA: TOPICAL_CREAM | CUTANEOUS | 1 refills | Status: DC

## 2021-07-25 NOTE — Progress Notes (Signed)
   Follow-Up Visit   Subjective  Dana Burke is a 76 y.o. female who presents for the following: Follow-up.  Patient here for 2 month follow-up AK vs CNCH of the right ear helix. Area improved after cryotherapy, but is starting to come back. She does sleep on her left side.   The following portions of the chart were reviewed this encounter and updated as appropriate:       Review of Systems:  No other skin or systemic complaints except as noted in HPI or Assessment and Plan.  Objective  Well appearing patient in no apparent distress; mood and affect are within normal limits.  A focused examination was performed including face, ear. Relevant physical exam findings are noted in the Assessment and Plan.  Right Ear Pink scaliness of the R ear canal and ear helix/antihelix  R mid ear helix Small residual at inf edge of R mid ear helix    Assessment & Plan  Seborrheic dermatitis Right Ear  Seborrheic Dermatitis  -  is a chronic persistent rash characterized by pinkness and scaling most commonly of the mid face but also can occur on the scalp (dandruff), ears; mid chest, mid back and groin.  It tends to be exacerbated by stress and cooler weather.  People who have neurologic disease may experience new onset or exacerbation of existing seborrheic dermatitis.  The condition is not curable but treatable and can be controlled.  Start hydrocortisone 2.5% cream Apply to AA BID until improved dsp 30g 1Rf.    hydrocortisone 2.5 % cream - Right Ear Apply to right ear twice daily until itch and scale improved.  AK (actinic keratosis) R mid ear helix  vs CNCH - small residual  Try to avoid sleeping on right side. Can use a travel pillow with central hole for that ear to avoid pressure.  Actinic keratoses are precancerous spots that appear secondary to cumulative UV radiation exposure/sun exposure over time. They are chronic with expected duration over 1 year. A portion of  actinic keratoses will progress to squamous cell carcinoma of the skin. It is not possible to reliably predict which spots will progress to skin cancer and so treatment is recommended to prevent development of skin cancer.  Recommend daily broad spectrum sunscreen SPF 30+ to sun-exposed areas, reapply every 2 hours as needed.  Recommend staying in the shade or wearing long sleeves, sun glasses (UVA+UVB protection) and wide brim hats (4-inch brim around the entire circumference of the hat). Call for new or changing lesions.  Destruction of lesion - R mid ear helix  Destruction method: cryotherapy   Informed consent: discussed and consent obtained   Lesion destroyed using liquid nitrogen: Yes   Region frozen until ice ball extended beyond lesion: Yes   Outcome: patient tolerated procedure well with no complications   Post-procedure details: wound care instructions given   Additional details:  Prior to procedure, discussed risks of blister formation, small wound, skin dyspigmentation, or rare scar following cryotherapy. Recommend Vaseline ointment to treated areas while healing.    Return as scheduled for TBSE.  IJamesetta Orleans, CMA, am acting as scribe for Brendolyn Patty, MD .  Documentation: I have reviewed the above documentation for accuracy and completeness, and I agree with the above.  Brendolyn Patty MD

## 2021-07-25 NOTE — Patient Instructions (Addendum)
Cryotherapy Aftercare  Wash gently with soap and water everyday.   Apply Vaseline and Band-Aid daily until healed.   Once right ear healed from cryotherapy treament, start hydrocortisone 2.5% cream twice a day until itch and scaling improved.  Due to recent changes in healthcare laws, you may see results of your pathology and/or laboratory studies on MyChart before the doctors have had a chance to review them. We understand that in some cases there may be results that are confusing or concerning to you. Please understand that not all results are received at the same time and often the doctors may need to interpret multiple results in order to provide you with the best plan of care or course of treatment. Therefore, we ask that you please give [place] 2 business days to thoroughly review all your results before contacting the office for clarification. Should we see a critical lab result, you will be contacted sooner.   If You Need Anything After Your Visit  If you have any questions or concerns for your doctor, please call our main line at [phone] and press option 4 to reach your doctor's medical assistant. If no one answers, please leave a voicemail as directed and we will return your call as soon as possible. Messages left after 4 pm will be answered the following business day.   You may also send [place] a message via [place]. We typically respond to MyChart messages within 1-2 business days.  For prescription refills, please ask your pharmacy to contact our office. Our fax number is [phone].  If you have an urgent issue when the clinic is closed that cannot wait until the next business day, you can page your doctor at the number below.    Please note that while we do our best to be available for urgent issues outside of office hours, we are not available 24/7.   If you have an urgent issue and are unable to reach [place], you may choose to seek medical care at your doctor's office, retail clinic,  urgent care center, or emergency room.  If you have a medical emergency, please immediately call 911 or go to the emergency department.  Pager Numbers  - Dr. Nehemiah Massed: [phone]  - Dr. Laurence Ferrari: [phone]  - Dr. Nicole Kindred: [phone]  In the event of inclement weather, please call our main line at [phone] for an update on the status of any delays or closures.  Dermatology Medication Tips: Please keep the boxes that topical medications come in in order to help keep track of the instructions about where and how to use these. Pharmacies typically print the medication instructions only on the boxes and not directly on the medication tubes.   If your medication is too expensive, please contact our office at [phone] option 4 or send [place] a message through [place].   We are unable to tell what your co-pay for medications will be in advance as this is different depending on your insurance coverage. However, we may be able to find a substitute medication at lower cost or fill out paperwork to get insurance to cover a needed medication.   If a prior authorization is required to get your medication covered by your insurance company, please allow [place] 1-2 business days to complete this process.  Drug prices often vary depending on where the prescription is filled and some pharmacies may offer cheaper prices.  The website www.goodrx.com contains coupons for medications through different pharmacies. The prices here do not account for what the  cost may be with help from insurance (it may be cheaper with your insurance), but the website can give you the price if you did not use any insurance.  - You can print the associated coupon and take it with your prescription to the pharmacy.  - You may also stop by our office during regular business hours and pick up a GoodRx coupon card.  - If you need your prescription sent electronically to a different pharmacy, notify our office through [facility] or by phone at [phone] option 4.     Si Usted Necesita Algo Despus de Su Visita  Tambin puede enviarnos un mensaje a travs de Pharmacist, community. Por lo general respondemos a los mensajes de MyChart en el transcurso de 1 a 2 das hbiles.  Para renovar recetas, por favor pida a su farmacia que se ponga en contacto con nuestra oficina. Harland Dingwall de fax es [place] [phone].  Si tiene un asunto urgente cuando la clnica est cerrada y que no puede esperar hasta el siguiente da hbil, puede llamar/localizar a su doctor(a) al nmero que aparece a continuacin.   Por favor, tenga en cuenta que aunque hacemos todo lo posible para estar disponibles para asuntos urgentes fuera del horario de [place], no estamos disponibles las 24 horas del da, los 7 das de la [place].   Si tiene un problema urgente y no puede comunicarse con nosotros, puede optar por buscar atencin mdica  en el consultorio de su doctor(a), en una clnica privada, en un centro de atencin urgente o en una sala de emergencias.  Si tiene Engineering geologist, por favor llame inmediatamente al 911 o vaya a la sala de emergencias.  Nmeros de bper  - Dr. Nehemiah Massed: [phone]  - Dra. Moye: [phone]  - Dra. Nicole Kindred: [phone]  En caso de inclemencias del [place], por favor llame a Johnsie Kindred principal al [phone] para una actualizacin sobre el [place] de cualquier retraso o cierre.  Consejos para la medicacin en dermatologa: Por favor, guarde las cajas en las que vienen los medicamentos de uso tpico para ayudarle a seguir las instrucciones sobre dnde y cmo usarlos. Las farmacias generalmente imprimen las instrucciones del medicamento slo en las cajas y no directamente en los tubos del [place].   Si su medicamento es muy caro, por favor, pngase en contacto con Zigmund Daniel llamando al [phone] y presione la opcin 4 o envenos un mensaje a travs de Pharmacist, community.   No podemos decirle cul  ser su copago por los medicamentos por adelantado ya que esto es diferente dependiendo de la cobertura de su seguro. Sin embargo, es posible que podamos encontrar un medicamento sustituto a Electrical engineer un formulario para que el seguro cubra el medicamento que se considera necesario.   Si se requiere una autorizacin previa para que su compaa de seguros [place] su medicamento, por favor permtanos de 1 a 2 das hbiles para completar este proceso.  Los precios de los medicamentos varan con frecuencia dependiendo del Environmental consultant de dnde se surte la receta y alguna farmacias pueden ofrecer precios ms baratos.  El sitio web www.goodrx.com tiene cupones para medicamentos de Airline pilot. Los precios aqu no tienen en cuenta lo que podra costar con la ayuda del seguro (puede ser ms barato con su seguro), pero el sitio web puede darle el precio si no utiliz Research scientist (physical sciences).  - Puede imprimir el cupn correspondiente y llevarlo con su receta a la farmacia.  - Tambin puede pasar por  nuestra oficina durante el horario de atencin regular y Charity fundraiser una tarjeta de cupones de GoodRx.  - Si necesita que su receta se enve electrnicamente a una farmacia diferente, informe a nuestra oficina a travs de MyChart de [place] o por telfono llamando al [phone] y presione la opcin 4.

## 2021-07-31 ENCOUNTER — Ambulatory Visit (INDEPENDENT_AMBULATORY_CARE_PROVIDER_SITE_OTHER): Payer: Medicare Other | Admitting: Internal Medicine

## 2021-07-31 VITALS — Temp 98.0°F | Ht 71.0 in | Wt 232.0 lb

## 2021-07-31 DIAGNOSIS — R1031 Right lower quadrant pain: Secondary | ICD-10-CM | POA: Diagnosis not present

## 2021-07-31 DIAGNOSIS — R109 Unspecified abdominal pain: Secondary | ICD-10-CM | POA: Insufficient documentation

## 2021-07-31 MED ORDER — CIPROFLOXACIN HCL 500 MG PO TABS: 500.0000 mg | ORAL_TABLET | Freq: Two times a day (BID) | ORAL | 0 refills | Status: AC

## 2021-07-31 MED ORDER — CIPROFLOXACIN HCL 500 MG PO TABS: 500.0000 mg | ORAL_TABLET | Freq: Two times a day (BID) | ORAL | 0 refills | Status: DC

## 2021-07-31 NOTE — Assessment & Plan Note (Addendum)
concern for pyelonephritis .  Empiric treatment advised. She will drop off a urine specimen tomorrow morning BEFORE  TAKING FIRST DOSE OF CIPRO

## 2021-07-31 NOTE — Progress Notes (Signed)
Virtual Visit converted to Telephone Note    This format is felt to be most appropriate for this patient at this time.  All issues noted in this document were discussed and addressed.  No physical exam was performed (except for noted visual exam findings with Video Visits).   I connected withNAME on 07/31/21 at  4:45 PM EDT by a video enabled telemedicine application or telephone and verified that I am speaking with the correct person using two identifiers. Location patient: home Location provider: work or home office Persons participating in the virtual visit: patient, provider  I discussed the limitations, risks, security and privacy concerns of performing an evaluation and management service by telephone and the availability of in person appointments. I also discussed with the patient that there may be a patient responsible charge related to this service. The patient expressed understanding and agreed to proceed.  Interactive audio and video telecommunications were attempted between this provider and patient, however failed, due to patient having technical difficulties .   We continued and completed visit with audio only.   Reason for visit: right lower quadrant,  right CVA pain   HPI:  76 yr old female with history of diverticulosis,  large ovarian mass  resected in January,  non malignant,  pancreatic cyst incidentally found on MRI abdomen,  multiple pulmonary nodules scheduled for repeat CT chest On Wed July 19 presents with 5 day history of malaise and RLQ abdominal pain that has now transitioned to right CVA pain over the weekend.  The RLQ pain was associated   with temps of 99 to 100 and malaise .  No nausea, dysuria , hematuria or frequency. No change in BM's.  On Saturday RLQ pain began to radiate to lower back and is aggravated by bending over.    ROS: See pertinent positives and negatives per HPI.  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  GR 1 DD.  Normal RV size and function.  Mild LA dilation.  Normal valves.    Family History  Problem Relation Age of Onset   Heart disease Mother    Hyperlipidemia Mother    Dementia Mother    Hypertension Father    Congestive Heart Failure Father    Hyperlipidemia Father    Diabetes Father    Heart disease Father        Status post pacemaker   Cancer Sister        melanoma   Cancer Sister        breast   Breast cancer Sister        diagnosed 3 times - lastest 30s   Hypertension Brother    Cancer  Brother        skin cancer   Bladder Cancer Brother    Diabetes Brother    Hypertension Brother    Diabetes Brother    Coronary artery disease Brother 6       4 vessel CABG   Diabetes Brother    Cancer Paternal Uncle        colon   Hypertension Paternal Grandmother    Seizures Grandchild    Breast cancer Cousin    Breast cancer Cousin    Breast cancer Cousin    Breast cancer Cousin    Breast cancer Cousin     SOCIAL HX:  reports that she quit smoking about 34 years ago. Her smoking use included cigarettes. She has a 0.80 pack-year smoking history. She has never used smokeless tobacco. She reports that she does not drink alcohol and does not use drugs.    Current Outpatient Medications:    acetaminophen  (TYLENOL) 325 MG tablet, Take by mouth., Disp: , Rfl:    albuterol (VENTOLIN HFA) 108 (90 Base) MCG/ACT inhaler, Inhale into the lungs every 6 (six) hours as needed for wheezing or shortness of breath., Disp: , Rfl:    benzonatate (TESSALON) 100 MG capsule, Take 1 capsule (100 mg total) by mouth 3 (three) times daily as needed for cough., Disp: 30 capsule, Rfl: 1   buPROPion (WELLBUTRIN XL) 300 MG 24 hr tablet, Take 1 tablet (300 mg total) by mouth daily., Disp: 90 tablet, Rfl: 2   cetirizine (ZYRTEC) 10 MG tablet, Take 10 mg by mouth at bedtime. , Disp: , Rfl:    Cholecalciferol (VITAMIN D-3) 1000 UNITS CAPS, Take 1 capsule by mouth daily., Disp: , Rfl:    escitalopram (LEXAPRO) 10 MG tablet, Take 1 tablet (10 mg total) by mouth daily., Disp: 90 tablet, Rfl: 2   esomeprazole (NEXIUM) 40 MG capsule, TAKE 1 CAPSULE BY MOUTH ONCE DAILY AT NOON, Disp: 90 capsule, Rfl: 3   gabapentin (NEURONTIN) 300 MG capsule, TAKE 1 CAPSULE BY MOUTH 4 TIMES DAILY, Disp: 360 capsule, Rfl: 1   Magnesium Citrate 125 MG CAPS, Take 2 capsules by mouth every evening., Disp: 180 capsule, Rfl: 2   Melatonin 10 MG TABS, Take 1 tablet by mouth., Disp: , Rfl:    Multiple Vitamins-Minerals (MULTIVITAMIN WITH MINERALS) tablet, Take 1 tablet by mouth daily., Disp: , Rfl:    simvastatin (ZOCOR) 40 MG tablet, TAKE 1 TABLET BY MOUTH AT BEDTIME *DOSE INCREASE*, Disp: 90 tablet, Rfl: 3   calcium carbonate (OS-CAL) 600 MG TABS, Take 600 mg by mouth 2 (two) times daily with a meal. (Patient not taking: Reported on 07/31/2021), Disp: , Rfl:    ciprofloxacin (CIPRO) 500 MG tablet, Take 1 tablet (500 mg total) by mouth 2 (two) times daily for 7 days., Disp: 14 tablet, Rfl: 0   Docusate Sodium (COLACE PO), Take 1 capsule by mouth daily. (Patient not taking: Reported on 07/31/2021), Disp: , Rfl:    hydrocortisone 2.5 % cream, Apply to right ear twice daily until itch and scale improved. (Patient not taking: Reported on 07/31/2021), Disp: 30 g,  Rfl: 1   nystatin (MYCOSTATIN/NYSTOP) powder, Apply 1 application topically 2 (two) times daily. To rash until resolved. (Patient not taking: Reported on 07/31/2021), Disp: 15 g, Rfl: 0  EXAM:  General impression: alert, cooperative and articulate.  No signs of being in distress  Lungs: speech is fluent sentence length suggests that patient is not short of breath and not punctuated by cough,  sneezing or sniffing. [name]   Psych: affect normal.  speech is articulate and non pressured .  Denies suicidal thoughts     ASSESSMENT AND PLAN:  Discussed the following assessment and plan:  Abdominal pain, RLQ - Plan: Urinalysis, Routine w reflex microscopic, Urine Culture, CBC with Differential/Platelet, Comprehensive metabolic panel  Acute right flank pain  Acute right flank pain concern for pyelonephritis .  Empiric treatment advised. She will drop off a urine specimen tomorrow morning BEFORE  TAKING FIRST DOSE OF CIPRO     I discussed the assessment and treatment plan with the patient. The patient was provided an opportunity to ask questions and all were answered. The patient agreed with the plan and demonstrated an understanding of the instructions.   The patient was advised to call back or seek an in-person evaluation if the symptoms worsen or if the condition fails to improve as anticipated.   I spent 30 minutes dedicated to the care of this patient on the date of this encounter to include pre-visit review of her medical history,  including all recent MRI and CT abdomen, non  face-to-face time with the patient , and post visit ordering of testing and therapeutics.    Crecencio Mc, MD

## 2021-08-01 ENCOUNTER — Other Ambulatory Visit (INDEPENDENT_AMBULATORY_CARE_PROVIDER_SITE_OTHER): Payer: Medicare Other

## 2021-08-01 ENCOUNTER — Other Ambulatory Visit: Payer: Self-pay

## 2021-08-01 DIAGNOSIS — R1031 Right lower quadrant pain: Secondary | ICD-10-CM | POA: Diagnosis not present

## 2021-08-01 LAB — URINALYSIS, ROUTINE W REFLEX MICROSCOPIC
Bilirubin Urine: NEGATIVE
Hgb urine dipstick: NEGATIVE
Ketones, ur: NEGATIVE
Nitrite: NEGATIVE
RBC / HPF: NONE SEEN (ref 0–?)
Specific Gravity, Urine: 1.01 (ref 1.000–1.030)
Total Protein, Urine: NEGATIVE
Urine Glucose: NEGATIVE
Urobilinogen, UA: 0.2 (ref 0.0–1.0)
pH: 6.5 (ref 5.0–8.0)

## 2021-08-01 LAB — CBC WITH DIFFERENTIAL/PLATELET
Basophils Absolute: 0 10*3/uL (ref 0.0–0.1)
Basophils Relative: 0.4 % (ref 0.0–3.0)
Eosinophils Absolute: 0.2 10*3/uL (ref 0.0–0.7)
Eosinophils Relative: 4.4 % (ref 0.0–5.0)
HCT: 38.3 % (ref 36.0–46.0)
Hemoglobin: 12.9 g/dL (ref 12.0–15.0)
Lymphocytes Relative: 34.3 % (ref 12.0–46.0)
Lymphs Abs: 1.9 10*3/uL (ref 0.7–4.0)
MCHC: 33.6 g/dL (ref 30.0–36.0)
MCV: 93.4 fl (ref 78.0–100.0)
Monocytes Absolute: 0.3 10*3/uL (ref 0.1–1.0)
Monocytes Relative: 5.2 % (ref 3.0–12.0)
Neutro Abs: 3.1 10*3/uL (ref 1.4–7.7)
Neutrophils Relative %: 55.7 % (ref 43.0–77.0)
Platelets: 261 10*3/uL (ref 150.0–400.0)
RBC: 4.1 Mil/uL (ref 3.87–5.11)
RDW: 13.3 % (ref 11.5–15.5)
WBC: 5.6 10*3/uL (ref 4.0–10.5)

## 2021-08-01 LAB — COMPREHENSIVE METABOLIC PANEL
ALT: 11 U/L (ref 0–35)
AST: 10 U/L (ref 0–37)
Albumin: 4.1 g/dL (ref 3.5–5.2)
Alkaline Phosphatase: 73 U/L (ref 39–117)
BUN: 13 mg/dL (ref 6–23)
CO2: 29 mEq/L (ref 19–32)
Calcium: 10 mg/dL (ref 8.4–10.5)
Chloride: 104 mEq/L (ref 96–112)
Creatinine, Ser: 0.87 mg/dL (ref 0.40–1.20)
GFR: 63.86 mL/min (ref >60.00)
Glucose, Bld: 147 mg/dL — ABNORMAL HIGH (ref 70–99)
Potassium: 4.5 mEq/L (ref 3.5–5.1)
Sodium: 138 mEq/L (ref 135–145)
Total Bilirubin: 0.3 mg/dL (ref 0.2–1.2)
Total Protein: 6.4 g/dL (ref 6.0–8.3)

## 2021-08-02 ENCOUNTER — Ambulatory Visit
Admission: RE | Admit: 2021-08-02 | Discharge: 2021-08-02 | Disposition: A | Discharge status: Home / Self Care | Payer: Medicare Other | Source: Ambulatory Visit | Attending: Pulmonary Disease | Admitting: Pulmonary Disease

## 2021-08-02 DIAGNOSIS — R918 Other nonspecific abnormal finding of lung field: Secondary | ICD-10-CM | POA: Diagnosis not present

## 2021-08-02 DIAGNOSIS — R911 Solitary pulmonary nodule: Secondary | ICD-10-CM | POA: Diagnosis not present

## 2021-08-02 LAB — URINE CULTURE
MICRO NUMBER:: 13661600
SPECIMEN QUALITY:: ADEQUATE

## 2021-08-08 ENCOUNTER — Ambulatory Visit (INDEPENDENT_AMBULATORY_CARE_PROVIDER_SITE_OTHER): Payer: Medicare Other | Admitting: Pulmonary Disease

## 2021-08-08 ENCOUNTER — Encounter: Payer: Self-pay | Admitting: Pulmonary Disease

## 2021-08-08 VITALS — BP 130/70 | HR 91 | Temp 98.1°F | Ht 71.0 in | Wt 234.4 lb

## 2021-08-08 DIAGNOSIS — R918 Other nonspecific abnormal finding of lung field: Secondary | ICD-10-CM

## 2021-08-08 DIAGNOSIS — D279 Benign neoplasm of unspecified ovary: Secondary | ICD-10-CM | POA: Diagnosis not present

## 2021-08-08 NOTE — Patient Instructions (Signed)
The lung nodule that we are concerned about, has not changed any since March however it does show some enlargement since the initial film in January.  Another 3 months follow-up with a more detailed CT will be ordered.  This will help [place] prepare for a biopsy if needed.   We will see you in follow-up in 3 months after your CT is done.

## 2021-08-08 NOTE — Progress Notes (Unsigned)
Subjective:    Patient ID: Dana Burke, female    DOB: October 21, 1945, 76 y.o.   MRN: [id number] Patient Care Team: Crecencio Mc, MD as PCP - General (Internal Medicine) Leonie Man, MD as PCP - Cardiology (Cardiology) Bary Castilla Forest Gleason, MD (General Surgery) Crecencio Mc, MD (Internal Medicine)  Chief Complaint  Patient presents with   Follow-up    CT 08/02/2021. Occ SOB with exertion.    HPI The patient is a 76 year old remote former smoker with minimal smoking history and a history as noted below, who presents for follow-up on the issue of multiple lung nodules on CT chest.  She was last seen here on 02 May 2021.  Recall that previously she had been followed here in 2020 for issues with cough and dysphonia.  This issues subsequently resolved.  And the patient continue to follow-up with primary care.  In the interim the patient had hysterectomy and bilateral salpingo-oophorectomy for bilateral adnexal masses on 17 January 2021.  She had a serous cystoadenofibroma.  Oncologic follow-up was not necessary.  A CT performed during workup of that mass showed that she had multiple small nodules (4 mm) the largest of which was on the left.  She had CT chest performed 19 July and showed that this larger nodule had enlarged from January but remained stable from the March CT.  Upon review it appears that this is running along the fissure and may be related to an intrapulmonary lymph node.  The patient has been asymptomatic with regards to these findings.  She does not endorse any shortness of breath, no fevers, chills or sweats.  She had weight loss after her surgery in January but this has stabilized.  She does not endorse any other symptomatology.  We reviewed the films with the patient and she was allowed to ask questions.  She is not inclined to have invasive procedures.  Review of Systems A 10 point review of systems was performed and it is as noted above otherwise  negative.  Patient Active Problem List   Diagnosis Date Noted   Acute right flank pain 07/31/2021   Hepatic steatosis 04/04/2021   Renal cyst, acquired, left 04/04/2021   Hiatal hernia 04/04/2021   Multiple pulmonary nodules 03/14/2021   Lesion of pancreas 01/10/2021   Malignant ovarian neoplasm, unspecified laterality ([place]) 12/26/2020   Dysphonia 12/14/2020   Irregular heartbeat 12/14/2020   Chronic epigastric pain 12/14/2020   Prediabetes 02/13/2018   Encounter for preventive health examination 12/04/2015   Insomnia 12/04/2015   Neuropathy 06/02/2014   Major depressive disorder, single episode 05/08/2014   Screening for osteoporosis 05/08/2014   History of DVT of lower extremity 05/05/2014   Hyperlipidemia with target low density lipoprotein (LDL) cholesterol less than 100 mg/dL 05/05/2013   Medicare annual wellness visit, subsequent 05/03/2013   Special screening for malignant neoplasms, colon 09/04/2012   Obesity 07/31/2012   History of abnormal mammogram 07/31/2012   Family history of malignant melanoma 07/31/2012   GERD (gastroesophageal reflux disease)    Social History   Tobacco Use   Smoking status: Former    Packs/day: 0.40    Years: 2.00    Total pack years: 0.80    Types: Cigarettes    Quit date: 07/30/1987    Years since quitting: 34.0   Smokeless tobacco: Never  Substance Use Topics   Alcohol use: No   No Known Allergies  Current Meds  Medication Sig   acetaminophen (TYLENOL) 325 MG tablet Take  by mouth.   albuterol (VENTOLIN HFA) 108 (90 Base) MCG/ACT inhaler Inhale into the lungs every 6 (six) hours as needed for wheezing or shortness of breath.   benzonatate (TESSALON) 100 MG capsule Take 1 capsule (100 mg total) by mouth 3 (three) times daily as needed for cough.   buPROPion (WELLBUTRIN XL) 300 MG 24 hr tablet Take 1 tablet (300 mg total) by mouth daily.   calcium carbonate (OS-CAL) 600 MG TABS Take 600 mg by mouth 2 (two) times daily with a meal.    cetirizine (ZYRTEC) 10 MG tablet Take 10 mg by mouth at bedtime.    Cholecalciferol (VITAMIN D-3) 1000 UNITS CAPS Take 1 capsule by mouth daily.   Docusate Sodium (COLACE PO) Take 1 capsule by mouth daily.   escitalopram (LEXAPRO) 10 MG tablet Take 1 tablet (10 mg total) by mouth daily.   esomeprazole (NEXIUM) 40 MG capsule TAKE 1 CAPSULE BY MOUTH ONCE DAILY AT NOON   gabapentin (NEURONTIN) 300 MG capsule TAKE 1 CAPSULE BY MOUTH 4 TIMES DAILY   hydrocortisone 2.5 % cream Apply to right ear twice daily until itch and scale improved.   Magnesium Citrate 125 MG CAPS Take 2 capsules by mouth every evening.   Melatonin 10 MG TABS Take 1 tablet by mouth.   Multiple Vitamins-Minerals (MULTIVITAMIN WITH MINERALS) tablet Take 1 tablet by mouth daily.   nystatin (MYCOSTATIN/NYSTOP) powder Apply 1 application topically 2 (two) times daily. To rash until resolved.   simvastatin (ZOCOR) 40 MG tablet TAKE 1 TABLET BY MOUTH AT BEDTIME *DOSE INCREASE*   Immunization History  Administered Date(s) Administered   Influenza, High Dose Seasonal PF 10/29/2012, 12/02/2015, 11/28/2016, 11/04/2017, 09/26/2018   Influenza,inj,Quad PF,6+ Mos 11/03/2013   Influenza-Unspecified 10/28/2014, 09/30/2020   PFIZER(Purple Top)SARS-COV-2 Vaccination 02/07/2019, 02/28/2019, 10/15/2019, 04/29/2020   Pfizer Covid-19 Vaccine Bivalent Booster 3yr & up 09/30/2020   Pneumococcal Conjugate-13 10/29/2012   Pneumococcal Polysaccharide-23 05/05/2014   Td 08/01/1994, 06/27/2004   Tdap 11/25/2006, 05/01/2013   Zoster, Live 04/16/2006, 12/30/2007       Objective:   Physical Exam BP 130/70 (BP Location: Left Arm, Cuff Size: Normal)   Pulse 91   Temp 98.1 F (36.7 C) (Temporal)   Ht '5\' 11"'$  (1.803 m)   Wt 234 lb 6.4 oz (106.3 kg)   SpO2 96%   BMI 32.69 kg/m   GENERAL: Obese woman, no acute distress, fully ambulatory, no conversational dyspnea. HEAD: Normocephalic, atraumatic.  EYES: Pupils equal, round, reactive to light.   No scleral icterus.  MOUTH: Dentition intact, no thrush, oral mucosa moist. NECK: Supple. No thyromegaly. Trachea midline. No JVD.  No adenopathy. PULMONARY: Good air entry bilaterally.  No adventitious sounds. CARDIOVASCULAR: S1 and S2. Regular rate and rhythm.  No rubs, murmurs or gallops heard. ABDOMEN: Obese otherwise benign. MUSCULOSKELETAL: No joint deformity, no clubbing, no edema.  NEUROLOGIC: No overt focal deficit, no gait disturbance, speech is fluent. SKIN: Intact,warm,dry. PSYCH: Mood and behavior normal  Representative image from CT chest performed 02 August 2021 showing the larger nodule in question, this follows along a fissural plane query intrapulmonary lymph node:      Assessment & Plan:     ICD-10-CM   1. Multiple lung nodules  R91.8 CT SUPER D CHEST WO [facility]   CT chest in October [facility] protocol in case biopsy needed Follow-up after CT    2. Cystadenofibroma of ovary, unspecified laterality  D27.9    Benign lesion Excised 17 January 2021     Orders  Placed This Encounter  Procedures   CT SUPER D CHEST WO [facility]    Standing Status:   Future    Number of Occurrences:   1    Standing Expiration Date:   08/09/2022    Scheduling Instructions:     89mo   Order Specific Question:   Preferred imaging location?    Answer:   [facility]   Will see the patient in follow-up in 3 months time she is to contact uKoreaprior to that time should any new difficulties arise.  CRenold Don MD Advanced Bronchoscopy PCCM [facility]    *This note was dictated using voice recognition software/Dragon.  Despite best efforts to proofread, errors can occur which can change the meaning. Any transcriptional errors that result from this process are unintentional and may not be fully corrected at the time of dictation.

## 2021-08-23 ENCOUNTER — Other Ambulatory Visit: Payer: Self-pay | Admitting: Internal Medicine

## 2021-09-11 ENCOUNTER — Encounter: Payer: Self-pay | Admitting: Internal Medicine

## 2021-09-11 ENCOUNTER — Ambulatory Visit (INDEPENDENT_AMBULATORY_CARE_PROVIDER_SITE_OTHER): Payer: Medicare Other | Admitting: Internal Medicine

## 2021-09-11 VITALS — BP 108/72 | HR 85 | Temp 97.8°F | Ht 71.0 in | Wt 234.2 lb

## 2021-09-11 DIAGNOSIS — Z9079 Acquired absence of other genital organ(s): Secondary | ICD-10-CM | POA: Diagnosis not present

## 2021-09-11 DIAGNOSIS — C569 Malignant neoplasm of unspecified ovary: Secondary | ICD-10-CM | POA: Diagnosis not present

## 2021-09-11 DIAGNOSIS — R7303 Prediabetes: Secondary | ICD-10-CM

## 2021-09-11 DIAGNOSIS — Z90710 Acquired absence of both cervix and uterus: Secondary | ICD-10-CM

## 2021-09-11 DIAGNOSIS — R918 Other nonspecific abnormal finding of lung field: Secondary | ICD-10-CM | POA: Diagnosis not present

## 2021-09-11 DIAGNOSIS — E785 Hyperlipidemia, unspecified: Secondary | ICD-10-CM

## 2021-09-11 DIAGNOSIS — R109 Unspecified abdominal pain: Secondary | ICD-10-CM

## 2021-09-11 DIAGNOSIS — Z90722 Acquired absence of ovaries, bilateral: Secondary | ICD-10-CM

## 2021-09-11 NOTE — Assessment & Plan Note (Signed)
Managed with continued surveillance due to one in the RU Lthat is slowy enlarging

## 2021-09-11 NOTE — Assessment & Plan Note (Signed)
Tolerating simvastatin.  LDL is at goal  Lab Results  Component Value Date   CHOL 132 06/16/2021   HDL 42 (L) 06/16/2021   LDLCALC 72 06/16/2021   LDLDIRECT 160.0 12/02/2015   TRIG 101 06/16/2021   CHOLHDL 3.1 06/16/2021

## 2021-09-11 NOTE — Assessment & Plan Note (Signed)
Patient is s/p TAH/BSO.  All path reports benign .  Advised not to use HRT to manage menopause symptoms

## 2021-09-11 NOTE — Progress Notes (Signed)
Subjective:  Patient ID: Dana Burke, female    DOB: 11/02/1945  Age: 76 y.o. MRN: [id number]  CC: The primary encounter diagnosis was Prediabetes. Diagnoses of Hyperlipidemia with target low density lipoprotein (LDL) cholesterol less than 100 mg/dL, S/P TAH-BSO, Acute right flank pain, Malignant ovarian neoplasm, unspecified laterality ([place]), and Multiple pulmonary nodules were also pertinent to this visit.   HPI Dana Burke presents for follow up Chief Complaint  Patient presents with   Follow-up    6 month follow up   1) S/p TAH/BSO in February for ovarian mass:  energy level still recovering from major surgery. Benign pathology Having new onset  hot flashes  , new,  more in the daytime .  Not disrupting her sleep.  Taking melatonin  2) Pulmonary nodules,  sub centimeter,  but one is enlarging.  Dr. Patsey Berthold handling.  Repeat CT in October.    3) Treated empirically for UTI in mid July,  culture was negative       Review of Systems;  Patient denies headache, fevers, malaise, unintentional weight loss, skin rash, eye pain, sinus congestion and sinus pain, sore throat, dysphagia,  hemoptysis , cough, dyspnea, wheezing, chest pain, palpitations, orthopnea, edema, abdominal pain, nausea, melena, diarrhea, constipation, flank pain, dysuria, hematuria, urinary  Frequency, nocturia, numbness, tingling, seizures,  Focal weakness, Loss of consciousness,  Tremor, insomnia, depression, anxiety, and suicidal ideation.      Objective:  BP 108/72 (BP Location: Left Arm, Patient Position: Sitting, Cuff Size: Large)   Pulse 85   Temp 97.8 F (36.6 C) (Oral)   Ht '5\' 11"'  (1.803 m)   Wt 234 lb 3.2 oz (106.2 kg)   SpO2 94%   BMI 32.66 kg/m   BP Readings from Last 3 Encounters:  09/11/21 108/72  08/08/21 130/70  05/02/21 120/70    Wt Readings from Last 3 Encounters:  09/11/21 234 lb 3.2 oz (106.2 kg)  08/08/21 234 lb 6.4 oz (106.3 kg)  07/31/21 232 lb (105.2 kg)     General appearance: alert, cooperative and appears stated age Ears: normal TM's and external ear canals both ears Throat: lips, mucosa, and tongue normal; teeth and gums normal Neck: no adenopathy, no carotid bruit, supple, symmetrical, trachea midline and thyroid not enlarged, symmetric, no tenderness/mass/nodules Back: symmetric, no curvature. ROM normal. No CVA tenderness. Lungs: clear to auscultation bilaterally Heart: regular rate and rhythm, S1, S2 normal, no murmur, click, rub or gallop Abdomen: soft, non-tender; bowel sounds normal; no masses,  no organomegaly Pulses: 2+ and symmetric Skin: Skin color, texture, turgor normal. No rashes or lesions Lymph nodes: Cervical, supraclavicular, and axillary nodes normal.  Lab Results  Component Value Date   HGBA1C 6.0 06/16/2021   HGBA1C 6.0 12/14/2020   HGBA1C 5.8 (H) 08/28/2018    Lab Results  Component Value Date   CREATININE 0.87 08/01/2021   CREATININE 0.84 06/16/2021   CREATININE 0.88 12/14/2020    Lab Results  Component Value Date   WBC 5.6 08/01/2021   HGB 12.9 08/01/2021   HCT 38.3 08/01/2021   PLT 261.0 08/01/2021   GLUCOSE 147 (H) 08/01/2021   CHOL 132 06/16/2021   TRIG 101 06/16/2021   HDL 42 (L) 06/16/2021   LDLDIRECT 160.0 12/02/2015   LDLCALC 72 06/16/2021   ALT 11 08/01/2021   AST 10 08/01/2021   NA 138 08/01/2021   K 4.5 08/01/2021   CL 104 08/01/2021   CREATININE 0.87 08/01/2021   BUN 13 08/01/2021   CO2  29 08/01/2021   TSH 1.19 12/14/2020   HGBA1C 6.0 06/16/2021   MICROALBUR 1.4 12/14/2020    CT Chest Wo Contrast  Result Date: 08/02/2021 CLINICAL DATA:  Follow-up pulmonary nodules. EXAM: CT CHEST WITHOUT CONTRAST TECHNIQUE: Multidetector CT imaging of the chest was performed following the standard protocol without IV contrast. RADIATION DOSE REDUCTION: This exam was performed according to the departmental dose-optimization program which includes automated exposure control, adjustment of the  mA and/or kV according to patient size and/or use of iterative reconstruction technique. COMPARISON:  Most recent exam 04/05/2021. Lung bases from abdominal CT 01/23/2021 FINDINGS: Cardiovascular: Mild aortic atherosclerosis. No aortic aneurysm. The heart is normal in size. No pericardial effusion. Occasional coronary artery calcifications. Mediastinum/Nodes: No enlarged mediastinal lymph nodes no bulky hilar adenopathy on this unenhanced exam. No thyroid nodule. There is a small hiatal hernia. No esophageal wall thickening. Lungs/Pleura: Stable 4 mm right upper lobe nodule series 3, image 45. Stable 4 mm nodule in the right upper lobe series 3, image 48. Stable 3 mm nodule in the anterior right lower lobe series 3 image 91. 9 x 6 mm left lower lobe nodule series 3, image 90, unchanged from March exam, however is slightly larger than January exam. There are multiple additional tiny pulmonary nodules throughout both lungs that are stable from prior exam. No evidence of new pulmonary nodule. No acute airspace disease or pleural effusion. No endobronchial lesion. Upper Abdomen: No acute upper abdominal findings.  Cholecystectomy. Musculoskeletal: Sclerotic focus in the right proximal humerus is only partially included in the field of view. This is not previously included in the field of view. No additional focal bone lesion. There is diffuse thoracic spondylosis with endplate spurring. No chest wall soft tissue abnormalities. IMPRESSION: 1. Multiple bilateral pulmonary nodules, largest measuring 9 x 6 mm in the left lower lobe. This nodule is unchanged from March exam, however is slightly larger than January exam. Given history of ovarian malignancy (listed in the electronic records), short interval follow-up CT in 3 months is recommended. 2. Sclerotic focus in the right proximal humerus is only partially included in the field of view. This is nonspecific, was not previously included in the field of view. 3. Small  hiatal hernia. Aortic Atherosclerosis (ICD10-I70.0). Electronically Signed   By: Keith Rake M.D.   On: 08/02/2021 22:38    Assessment & Plan:   Problem List Items Addressed This Visit     Hyperlipidemia with target low density lipoprotein (LDL) cholesterol less than 100 mg/dL (Chronic)    Tolerating simvastatin.  LDL is at goal  Lab Results  Component Value Date   CHOL 132 06/16/2021   HDL 42 (L) 06/16/2021   LDLCALC 72 06/16/2021   LDLDIRECT 160.0 12/02/2015   TRIG 101 06/16/2021   CHOLHDL 3.1 06/16/2021        Relevant Orders   Lipid Profile   Direct LDL   Prediabetes - Primary (Chronic)   Relevant Orders   Comp Met (CMET)   HgB A1c   Acute right flank pain    Resolved.  Empiric treatment was done.  Culture was negative (Group B strep)      Malignant ovarian neoplasm, unspecified laterality [facility])    Patient is s/p TAH/BSO.  All path reports benign .  Advised not to use HRT to manage menopause symptoms       Multiple pulmonary nodules    Managed with continued surveillance due to one in the RU Lthat is slowy enlarging  S/P TAH-BSO     New onset hot flashes, managed with HRT per GYN        Follow-up: No follow-ups on file.   Crecencio Mc, MD

## 2021-09-11 NOTE — Assessment & Plan Note (Signed)
Resolved.  Empiric treatment was done.  Culture was negative (Group B strep)

## 2021-09-11 NOTE — Assessment & Plan Note (Signed)
New onset hot flashes, managed with HRT per GYN

## 2021-09-11 NOTE — Patient Instructions (Addendum)
I do recommend getting the FLU  and the RSV vaccines before you travel   Get the shingles vaccine at your pharmacy when you return gfrom [place]   SAFE TRAVELS!!  SEE YOU IN 6 MONTHS

## 2021-10-25 DIAGNOSIS — Z23 Encounter for immunization: Secondary | ICD-10-CM | POA: Diagnosis not present

## 2021-11-02 ENCOUNTER — Ambulatory Visit
Admission: RE | Admit: 2021-11-02 | Discharge: 2021-11-02 | Disposition: A | Discharge status: Home / Self Care | Payer: Medicare Other | Source: Ambulatory Visit | Attending: Pulmonary Disease | Admitting: Pulmonary Disease

## 2021-11-02 DIAGNOSIS — R918 Other nonspecific abnormal finding of lung field: Secondary | ICD-10-CM | POA: Diagnosis not present

## 2021-11-08 ENCOUNTER — Ambulatory Visit: Payer: Medicare Other | Admitting: Pulmonary Disease

## 2021-11-08 ENCOUNTER — Encounter: Payer: Self-pay | Admitting: Pulmonary Disease

## 2021-11-08 VITALS — BP 124/80 | HR 80 | Temp 97.9°F | Ht 71.0 in | Wt 233.8 lb

## 2021-11-08 DIAGNOSIS — D279 Benign neoplasm of unspecified ovary: Secondary | ICD-10-CM

## 2021-11-08 DIAGNOSIS — R918 Other nonspecific abnormal finding of lung field: Secondary | ICD-10-CM

## 2021-11-08 NOTE — Patient Instructions (Signed)
We will repeat a CT scan in 6 months time  We will see you after the CT scan is done.  Call sooner should any new problems arise.

## 2021-11-17 DIAGNOSIS — G4733 Obstructive sleep apnea (adult) (pediatric): Secondary | ICD-10-CM | POA: Diagnosis not present

## 2021-11-17 DIAGNOSIS — R0602 Shortness of breath: Secondary | ICD-10-CM | POA: Diagnosis not present

## 2021-11-18 DIAGNOSIS — R0602 Shortness of breath: Secondary | ICD-10-CM | POA: Diagnosis not present

## 2021-11-18 DIAGNOSIS — G4733 Obstructive sleep apnea (adult) (pediatric): Secondary | ICD-10-CM | POA: Diagnosis not present

## 2021-11-20 ENCOUNTER — Ambulatory Visit (INDEPENDENT_AMBULATORY_CARE_PROVIDER_SITE_OTHER): Payer: Medicare Other | Admitting: Dermatology

## 2021-11-20 ENCOUNTER — Encounter: Payer: Self-pay | Admitting: Dermatology

## 2021-11-20 DIAGNOSIS — Z1283 Encounter for screening for malignant neoplasm of skin: Secondary | ICD-10-CM

## 2021-11-20 DIAGNOSIS — D1801 Hemangioma of skin and subcutaneous tissue: Secondary | ICD-10-CM

## 2021-11-20 DIAGNOSIS — L570 Actinic keratosis: Secondary | ICD-10-CM

## 2021-11-20 DIAGNOSIS — L720 Epidermal cyst: Secondary | ICD-10-CM | POA: Diagnosis not present

## 2021-11-20 DIAGNOSIS — L821 Other seborrheic keratosis: Secondary | ICD-10-CM | POA: Diagnosis not present

## 2021-11-20 DIAGNOSIS — L578 Other skin changes due to chronic exposure to nonionizing radiation: Secondary | ICD-10-CM | POA: Diagnosis not present

## 2021-11-20 DIAGNOSIS — L820 Inflamed seborrheic keratosis: Secondary | ICD-10-CM | POA: Diagnosis not present

## 2021-11-20 NOTE — Patient Instructions (Addendum)
Cryotherapy Aftercare  Wash gently with soap and water everyday.   Apply Vaseline and Band-Aid daily until healed.     Due to recent changes in healthcare laws, you may see results of your pathology and/or laboratory studies on MyChart before the doctors have had a chance to review them. We understand that in some cases there may be results that are confusing or concerning to you. Please understand that not all results are received at the same time and often the doctors may need to interpret multiple results in order to provide you with the best plan of care or course of treatment. Therefore, we ask that you please give us 2 business days to thoroughly review all your results before contacting the office for clarification. Should we see a critical lab result, you will be contacted sooner.   If You Need Anything After Your Visit  If you have any questions or concerns for your doctor, please call our main line at [phone] and press option 4 to reach your doctor's medical assistant. If no one answers, please leave a voicemail as directed and we will return your call as soon as possible. Messages left after 4 pm will be answered the following business day.   You may also send us a message via MyChart. We typically respond to MyChart messages within 1-2 business days.  For prescription refills, please ask your pharmacy to contact our office. Our fax number is [phone].  If you have an urgent issue when the clinic is closed that cannot wait until the next business day, you can page your doctor at the number below.    Please note that while we do our best to be available for urgent issues outside of office hours, we are not available 24/7.   If you have an urgent issue and are unable to reach us, you may choose to seek medical care at your doctor's office, retail clinic, urgent care center, or emergency room.  If you have a medical emergency, please immediately call 911 or go to the  emergency department.  Pager Numbers  - Dr. Kowalski: [phone]  - Dr. Moye: [phone]  - Dr. Stewart: [phone]  In the event of inclement weather, please call our main line at [phone] for an update on the status of any delays or closures.  Dermatology Medication Tips: Please keep the boxes that topical medications come in in order to help keep track of the instructions about where and how to use these. Pharmacies typically print the medication instructions only on the boxes and not directly on the medication tubes.   If your medication is too expensive, please contact our office at [phone] option 4 or send us a message through MyChart.   We are unable to tell what your co-pay for medications will be in advance as this is different depending on your insurance coverage. However, we may be able to find a substitute medication at lower cost or fill out paperwork to get insurance to cover a needed medication.   If a prior authorization is required to get your medication covered by your insurance company, please allow us 1-2 business days to complete this process.  Drug prices often vary depending on where the prescription is filled and some pharmacies may offer cheaper prices.  The website www.goodrx.com contains coupons for medications through different pharmacies. The prices here do not account for what the cost may be with help from insurance (it may be cheaper with your insurance), but the website can   give you the price if you did not use any insurance.  - You can print the associated coupon and take it with your prescription to the pharmacy.  - You may also stop by our office during regular business hours and pick up a GoodRx coupon card.  - If you need your prescription sent electronically to a different pharmacy, notify our office through [place] MyChart or by phone at [phone] option 4.     Si Usted Necesita Algo Despus de Su Visita  Tambin puede  enviarnos un mensaje a travs de MyChart. Por lo general respondemos a los mensajes de MyChart en el transcurso de 1 a 2 das hbiles.  Para renovar recetas, por favor pida a su farmacia que se ponga en contacto con nuestra oficina. Nuestro nmero de fax es el [phone].  Si tiene un asunto urgente cuando la clnica est cerrada y que no puede esperar hasta el siguiente da hbil, puede llamar/localizar a su doctor(a) al nmero que aparece a continuacin.   Por favor, tenga en cuenta que aunque hacemos todo lo posible para estar disponibles para asuntos urgentes fuera del horario de oficina, no estamos disponibles las 24 horas del da, los 7 das de la semana.   Si tiene un problema urgente y no puede comunicarse con nosotros, puede optar por buscar atencin mdica  en el consultorio de su doctor(a), en una clnica privada, en un centro de atencin urgente o en una sala de emergencias.  Si tiene una emergencia mdica, por favor llame inmediatamente al 911 o vaya a la sala de emergencias.  Nmeros de bper  - Dr. Kowalski: [phone]  - Dra. Moye: [phone]  - Dra. Stewart: [phone]  En caso de inclemencias del tiempo, por favor llame a nuestra lnea principal al [phone] para una actualizacin sobre el estado de cualquier retraso o cierre.  Consejos para la medicacin en dermatologa: Por favor, guarde las cajas en las que vienen los medicamentos de uso tpico para ayudarle a seguir las instrucciones sobre dnde y cmo usarlos. Las farmacias generalmente imprimen las instrucciones del medicamento slo en las cajas y no directamente en los tubos del medicamento.   Si su medicamento es muy caro, por favor, pngase en contacto con nuestra oficina llamando al [phone] y presione la opcin 4 o envenos un mensaje a travs de MyChart.   No podemos decirle cul ser su copago por los medicamentos por adelantado ya que esto es diferente dependiendo de la cobertura de su seguro.  Sin embargo, es posible que podamos encontrar un medicamento sustituto a menor costo o llenar un formulario para que el seguro cubra el medicamento que se considera necesario.   Si se requiere una autorizacin previa para que su compaa de seguros cubra su medicamento, por favor permtanos de 1 a 2 das hbiles para completar este proceso.  Los precios de los medicamentos varan con frecuencia dependiendo del lugar de dnde se surte la receta y alguna farmacias pueden ofrecer precios ms baratos.  El sitio web www.goodrx.com tiene cupones para medicamentos de diferentes farmacias. Los precios aqu no tienen en cuenta lo que podra costar con la ayuda del seguro (puede ser ms barato con su seguro), pero el sitio web puede darle el precio si no utiliz ningn seguro.  - Puede imprimir el cupn correspondiente y llevarlo con su receta a la farmacia.  - Tambin puede pasar por nuestra oficina durante el horario de atencin regular y recoger una tarjeta de cupones de GoodRx.  -   Si necesita que su receta se enve electrnicamente a una farmacia diferente, informe a nuestra oficina a travs de MyChart de [facility] o por telfono llamando al [phone] y presione la opcin 4.  

## 2021-11-20 NOTE — Progress Notes (Signed)
Follow-Up Visit   Subjective  Dana Burke is a 76 y.o. female who presents for the following: Total body skin exam (Hx of AKs), check spot (L jaw, 1-2m gets scaly), and skin tags (L upper eyelid, L lat canthus, few months, irritating).  The patient presents for Total-Body Skin Exam (TBSE) for skin cancer screening and mole check.  The patient has spots, moles and lesions to be evaluated, some may be new or changing and the patient has concerns that these could be cancer.   The following portions of the chart were reviewed this encounter and updated as appropriate:       Review of Systems:  No other skin or systemic complaints except as noted in HPI or Assessment and Plan.  Objective  Well appearing patient in no apparent distress; mood and affect are within normal limits.  A full examination was performed including scalp, head, eyes, ears, nose, lips, neck, chest, axillae, abdomen, back, buttocks, bilateral upper extremities, bilateral lower extremities, hands, feet, fingers, toes, fingernails, and toenails. All findings within normal limits unless otherwise noted below.  R mid jaw x 1, L upper eyelid x 3, L lat eyelid x 1 (5) Stuck on waxy paps with erythema  R ear mid helix x 1 Residual pink scaly macule  mid back Cystic pap    Assessment & Plan   Seborrheic Keratoses - Stuck-on, waxy, tan-brown papules and/or plaques  - Benign-appearing - Discussed benign etiology and prognosis. - Observe - Call for any changes - back, legs  Hemangiomas - Red papules - Discussed benign nature - Observe - Call for any changes - back, abdomen, legs  Actinic Damage - Chronic condition, secondary to cumulative UV/sun exposure - diffuse scaly erythematous macules with underlying dyspigmentation - Recommend daily broad spectrum sunscreen SPF 30+ to sun-exposed areas, reapply every 2 hours as needed.  - Staying in the shade or wearing long sleeves, sun glasses (UVA+UVB  protection) and wide brim hats (4-inch brim around the entire circumference of the hat) are also recommended for sun protection.  - Call for new or changing lesions. - chest  Skin cancer screening performed today.   Inflamed seborrheic keratosis (5) R mid jaw x 1, L upper eyelid x 3, L lat eyelid x 1  Symptomatic, irritating, patient would like treated.   Destruction of lesion - R mid jaw x 1, L upper eyelid x 3, L lat eyelid x 1  Destruction method: cryotherapy   Informed consent: discussed and consent obtained   Lesion destroyed using liquid nitrogen: Yes   Region frozen until ice ball extended beyond lesion: Yes   Outcome: patient tolerated procedure well with no complications   Post-procedure details: wound care instructions given   Additional details:  Prior to procedure, discussed risks of blister formation, small wound, skin dyspigmentation, or rare scar following cryotherapy. Recommend Vaseline ointment to treated areas while healing.   AK (actinic keratosis) R ear mid helix x 1  Destruction of lesion - R ear mid helix x 1  Destruction method: cryotherapy   Informed consent: discussed and consent obtained   Lesion destroyed using liquid nitrogen: Yes   Region frozen until ice ball extended beyond lesion: Yes   Outcome: patient tolerated procedure well with no complications   Post-procedure details: wound care instructions given   Additional details:  Prior to procedure, discussed risks of blister formation, small wound, skin dyspigmentation, or rare scar following cryotherapy. Recommend Vaseline ointment to treated areas while healing.  Epidermal cyst mid back  Benign, observe.     Return in about 1 year (around 11/21/2022) for TBSE, Hx of AKs.  I, Othelia Pulling, RMA, am acting as scribe for Brendolyn Patty, MD .  Documentation: I have reviewed the above documentation for accuracy and completeness, and I agree with the above.  Brendolyn Patty MD

## 2021-12-05 ENCOUNTER — Other Ambulatory Visit: Payer: Self-pay | Admitting: Internal Medicine

## 2022-02-10 ENCOUNTER — Encounter: Payer: Self-pay | Admitting: Pulmonary Disease

## 2022-02-22 ENCOUNTER — Other Ambulatory Visit: Payer: Self-pay | Admitting: Family

## 2022-03-09 ENCOUNTER — Other Ambulatory Visit (INDEPENDENT_AMBULATORY_CARE_PROVIDER_SITE_OTHER): Payer: Medicare Other

## 2022-03-09 DIAGNOSIS — E119 Type 2 diabetes mellitus without complications: Secondary | ICD-10-CM | POA: Insufficient documentation

## 2022-03-09 DIAGNOSIS — E785 Hyperlipidemia, unspecified: Secondary | ICD-10-CM | POA: Diagnosis not present

## 2022-03-09 DIAGNOSIS — R7303 Prediabetes: Secondary | ICD-10-CM

## 2022-03-09 LAB — COMPREHENSIVE METABOLIC PANEL
ALT: 21 U/L (ref 0–35)
AST: 17 U/L (ref 0–37)
Albumin: 4.3 g/dL (ref 3.5–5.2)
Alkaline Phosphatase: 76 U/L (ref 39–117)
BUN: 15 mg/dL (ref 6–23)
CO2: 28 mEq/L (ref 19–32)
Calcium: 10.4 mg/dL (ref 8.4–10.5)
Chloride: 103 mEq/L (ref 96–112)
Creatinine, Ser: 0.84 mg/dL (ref 0.40–1.20)
GFR: 66.32 mL/min (ref >60.00)
Glucose, Bld: 127 mg/dL — ABNORMAL HIGH (ref 70–99)
Potassium: 4.5 mEq/L (ref 3.5–5.1)
Sodium: 138 mEq/L (ref 135–145)
Total Bilirubin: 0.5 mg/dL (ref 0.2–1.2)
Total Protein: 6.7 g/dL (ref 6.0–8.3)

## 2022-03-09 LAB — LIPID PANEL
Cholesterol: 142 mg/dL (ref 0–200)
HDL: 35 mg/dL — ABNORMAL LOW (ref >39.00)
LDL Cholesterol: 78 mg/dL (ref 0–99)
NonHDL: 106.79
Total CHOL/HDL Ratio: 4
Triglycerides: 143 mg/dL (ref 0.0–149.0)
VLDL: 28.6 mg/dL (ref 0.0–40.0)

## 2022-03-09 LAB — LDL CHOLESTEROL, DIRECT: Direct LDL: 96 mg/dL

## 2022-03-09 LAB — HEMOGLOBIN A1C: Hgb A1c MFr Bld: 7.2 % — ABNORMAL HIGH (ref 4.6–6.5)

## 2022-03-14 ENCOUNTER — Telehealth: Payer: Self-pay | Admitting: Internal Medicine

## 2022-03-14 ENCOUNTER — Ambulatory Visit (INDEPENDENT_AMBULATORY_CARE_PROVIDER_SITE_OTHER): Payer: Medicare Other | Admitting: Internal Medicine

## 2022-03-14 ENCOUNTER — Encounter: Payer: Self-pay | Admitting: Internal Medicine

## 2022-03-14 VITALS — BP 128/82 | HR 88 | Temp 97.9°F | Ht 71.0 in | Wt 239.2 lb

## 2022-03-14 DIAGNOSIS — I25110 Atherosclerotic heart disease of native coronary artery with unstable angina pectoris: Secondary | ICD-10-CM

## 2022-03-14 DIAGNOSIS — R7303 Prediabetes: Secondary | ICD-10-CM

## 2022-03-14 DIAGNOSIS — E785 Hyperlipidemia, unspecified: Secondary | ICD-10-CM

## 2022-03-14 DIAGNOSIS — Z1231 Encounter for screening mammogram for malignant neoplasm of breast: Secondary | ICD-10-CM

## 2022-03-14 DIAGNOSIS — R0602 Shortness of breath: Secondary | ICD-10-CM

## 2022-03-14 DIAGNOSIS — I200 Unstable angina: Secondary | ICD-10-CM | POA: Diagnosis not present

## 2022-03-14 DIAGNOSIS — G629 Polyneuropathy, unspecified: Secondary | ICD-10-CM

## 2022-03-14 DIAGNOSIS — I2510 Atherosclerotic heart disease of native coronary artery without angina pectoris: Secondary | ICD-10-CM | POA: Insufficient documentation

## 2022-03-14 LAB — BRAIN NATRIURETIC PEPTIDE: Pro B Natriuretic peptide (BNP): 4 pg/mL (ref 0.0–100.0)

## 2022-03-14 MED ORDER — ATORVASTATIN CALCIUM 40 MG PO TABS: 40.0000 mg | ORAL_TABLET | Freq: Every day | ORAL | 3 refills | Status: DC

## 2022-03-14 MED ORDER — GABAPENTIN 300 MG PO CAPS
ORAL_CAPSULE | ORAL | 1 refills | Status: DC
Start: 2022-03-14 — End: 2022-09-20

## 2022-03-14 MED ORDER — NITROGLYCERIN 0.4 MG SL SUBL: 0.4000 mg | SUBLINGUAL_TABLET | SUBLINGUAL | 3 refills | Status: AC | PRN

## 2022-03-14 MED ORDER — BUPROPION HCL ER (XL) 300 MG PO TB24: 300.0000 mg | ORAL_TABLET | Freq: Every day | ORAL | 2 refills | Status: DC

## 2022-03-14 MED ORDER — METOPROLOL SUCCINATE ER 25 MG PO TB24
25.0000 mg | ORAL_TABLET | Freq: Every day | ORAL | 3 refills | Status: DC
Start: 2022-03-14 — End: 2022-11-06

## 2022-03-14 NOTE — Assessment & Plan Note (Signed)
New onset  occurring with mild exertion  (housework) .  have ordered and reviewed a 12 lead EKG and find that there are no acute changes and patient is in sinus rhythm with a chronic RBBB.  . Today I am    adding aspirin   beta blocker, higher potency statin ,  and prn Nitroglycerin .  Will discuss with Ellyn Hack (last seen Dec 2022)

## 2022-03-14 NOTE — Telephone Encounter (Signed)
Drug name got cut off on the prescription that was sent over Per office note - medication changed to Atorvastatin '40mg'$  QD - stop simvastatin. Pharm advised

## 2022-03-14 NOTE — Progress Notes (Unsigned)
Subjective:  Patient ID: Dana Burke, female    DOB: 1945-07-02  Age: 77 y.o. MRN: [id number]  CC: The primary encounter diagnosis was Shortness of breath. Diagnoses of Neuropathy, Prediabetes, Hyperlipidemia with target low density lipoprotein (LDL) cholesterol less than 100 mg/dL, and Screening mammogram for breast cancer were also pertinent to this visit.   HPI Dana Burke presents for  Chief Complaint  Patient presents with   Medical Management of Chronic Issues    6 month follow up   Recent onset of Dyspnea with housework ( sweeping, vacuum) for the past month accompanied by chest heaviness  .  New onset hot flashes with upper body sweating,  not occurring daily.  Not after eating .  She is s/p TAH/BSO   Outpatient Medications Prior to Visit  Medication Sig Dispense Refill   acetaminophen (TYLENOL) 325 MG tablet Take by mouth.     albuterol (VENTOLIN HFA) 108 (90 Base) MCG/ACT inhaler Inhale into the lungs every 6 (six) hours as needed for wheezing or shortness of breath.     benzonatate (TESSALON) 100 MG capsule Take 1 capsule (100 mg total) by mouth 3 (three) times daily as needed for cough. 30 capsule 1   calcium carbonate (OS-CAL) 600 MG TABS Take 600 mg by mouth 2 (two) times daily with a meal.     cetirizine (ZYRTEC) 10 MG tablet Take 10 mg by mouth at bedtime.      Cholecalciferol (VITAMIN D-3) 1000 UNITS CAPS Take 1 capsule by mouth daily.     Docusate Sodium (COLACE PO) Take 1 capsule by mouth daily.     escitalopram (LEXAPRO) 10 MG tablet TAKE 1 TABLET BY MOUTH ONCE DAILY 90 tablet 2   esomeprazole (NEXIUM) 40 MG capsule TAKE 1 CAPSULE BY MOUTH ONCE DAILY AT NOON 90 capsule 3   Magnesium Citrate 125 MG CAPS Take 2 capsules by mouth every evening. 180 capsule 2   Melatonin 10 MG TABS Take 1 tablet by mouth.     Multiple Vitamins-Minerals (MULTIVITAMIN WITH MINERALS) tablet Take 1 tablet by mouth daily.     simvastatin (ZOCOR) 40 MG tablet TAKE 1 TABLET  BY MOUTH AT BEDTIME *DOSE INCREASE* 90 tablet 3   buPROPion (WELLBUTRIN XL) 300 MG 24 hr tablet Take 1 tablet (300 mg total) by mouth daily. 90 tablet 2   gabapentin (NEURONTIN) 300 MG capsule TAKE 1 CAPSULE BY MOUTH 4 TIMES DAILY 360 capsule 1   hydrocortisone 2.5 % cream Apply to right ear twice daily until itch and scale improved. (Patient not taking: Reported on 03/14/2022) 30 g 1   nystatin (MYCOSTATIN/NYSTOP) powder Apply 1 application topically 2 (two) times daily. To rash until resolved. (Patient not taking: Reported on 03/14/2022) 15 g 0   No facility-administered medications prior to visit.    Review of Systems;  Patient denies headache, fevers, malaise, unintentional weight loss, skin rash, eye pain, sinus congestion and sinus pain, sore throat, dysphagia,  hemoptysis , cough, wheezing, palpitations, orthopnea, edema, abdominal pain, nausea, melena, diarrhea, constipation, flank pain, dysuria, hematuria, urinary  Frequency, nocturia, numbness, tingling, seizures,  Focal weakness, Loss of consciousness,  Tremor, insomnia, depression, anxiety, and suicidal ideation.      Objective:  BP 128/82   Pulse 88   Temp 97.9 F (36.6 C) (Oral)   Ht '5\' 11"'$  (1.803 m)   Wt 239 lb 3.2 oz (108.5 kg)   SpO2 94%   BMI 33.36 kg/m   BP Readings from Last 3 Encounters:  03/14/22 128/82  11/08/21 124/80  09/11/21 108/72    Wt Readings from Last 3 Encounters:  03/14/22 239 lb 3.2 oz (108.5 kg)  11/08/21 233 lb 12.8 oz (106.1 kg)  09/11/21 234 lb 3.2 oz (106.2 kg)    Physical Exam Vitals reviewed.  Constitutional:      General: She is not in acute distress.    Appearance: Normal appearance. She is normal weight. She is not ill-appearing, toxic-appearing or diaphoretic.  HENT:     Head: Normocephalic.  Eyes:     General: No scleral icterus.       Right eye: No discharge.        Left eye: No discharge.     Conjunctiva/sclera: Conjunctivae normal.  Cardiovascular:     Rate and Rhythm:  Normal rate and regular rhythm.     Heart sounds: Normal heart sounds.  Pulmonary:     Effort: Pulmonary effort is normal. No respiratory distress.     Breath sounds: Normal breath sounds.  Abdominal:     General: Abdomen is flat. Bowel sounds are normal.     Palpations: Abdomen is soft.  Musculoskeletal:        General: Normal range of motion.     Cervical back: Normal range of motion and neck supple.  Skin:    General: Skin is warm and dry.  Neurological:     General: No focal deficit present.     Mental Status: She is alert and oriented to person, place, and time. Mental status is at baseline.  Psychiatric:        Mood and Affect: Mood normal.        Behavior: Behavior normal.        Thought Content: Thought content normal.        Judgment: Judgment normal.   Lab Results  Component Value Date   HGBA1C 7.2 (H) 03/09/2022   HGBA1C 6.0 06/16/2021   HGBA1C 6.0 12/14/2020    Lab Results  Component Value Date   CREATININE 0.84 03/09/2022   CREATININE 0.87 08/01/2021   CREATININE 0.84 06/16/2021    Lab Results  Component Value Date   WBC 5.6 08/01/2021   HGB 12.9 08/01/2021   HCT 38.3 08/01/2021   PLT 261.0 08/01/2021   GLUCOSE 127 (H) 03/09/2022   CHOL 142 03/09/2022   TRIG 143.0 03/09/2022   HDL 35.00 (L) 03/09/2022   LDLDIRECT 96.0 03/09/2022   LDLCALC 78 03/09/2022   ALT 21 03/09/2022   AST 17 03/09/2022   NA 138 03/09/2022   K 4.5 03/09/2022   CL 103 03/09/2022   CREATININE 0.84 03/09/2022   BUN 15 03/09/2022   CO2 28 03/09/2022   TSH 1.19 12/14/2020   HGBA1C 7.2 (H) 03/09/2022   MICROALBUR 1.4 12/14/2020    CT SUPER D CHEST WO MONARCH PILOT  Result Date: 11/03/2021 CLINICAL DATA:  Lung nodules. EXAM: CT CHEST WITHOUT CONTRAST TECHNIQUE: Multidetector CT imaging of the chest was performed using thin slice collimation for electromagnetic bronchoscopy planning purposes, without intravenous contrast. RADIATION DOSE REDUCTION: This exam was performed  according to the departmental dose-optimization program which includes automated exposure control, adjustment of the mA and/or kV according to patient size and/or use of iterative reconstruction technique. COMPARISON:  08/02/2021, 04/05/2021 and 10/19/2009. FINDINGS: Cardiovascular: Atherosclerotic calcification of the aorta, aortic valve and coronary arteries. Heart size normal. No pericardial effusion. Mediastinum/Nodes: No pathologically enlarged mediastinal or axillary lymph nodes. Hilar regions are difficult to evaluate without IV contrast. Esophagus is grossly  unremarkable. Lungs/Pleura: Scattered pulmonary parenchymal scarring. Multiple small peripheral pulmonary nodules measure up to 8 mm in the anterolateral left lower lobe (3/35), unchanged from 04/05/2021. No pleural fluid. Airway is unremarkable. Upper Abdomen: Visualized portions of the liver, adrenal glands, kidneys, spleen, pancreas, stomach and bowel are unremarkable with the exception of a small hiatal hernia. Cholecystectomy. No upper abdominal adenopathy. Musculoskeletal: Degenerative changes in the spine. IMPRESSION: 1. Scattered pulmonary nodules measure up to 8 mm in the left lower lobe, unchanged from 04/05/2021. In the absence of known malignancy, additional follow-up in 1 year is recommended to ensure continued stability. This recommendation follows the consensus statement: Guidelines for Management of Small Pulmonary Nodules Detected on CT Images: From the Fleischner Society 2017; Radiology 2017; 284:228-243. 2. Aortic atherosclerosis (ICD10-I70.0). Coronary artery calcification. Electronically Signed   By: Lorin Picket M.D.   On: 11/03/2021 13:42    Assessment & Plan:  .Shortness of breath Assessment & Plan: Need to rule out CAD given atherosclerosis of coronaries noted Oct 2023 CT chest.  I  have ordered and reviewed a 12 lead EKG and find that there are no acute changes and patient is in sinus rhythm. With a RBBB       Orders: -     EKG 12-Lead -     Brain natriuretic peptide  Neuropathy  Prediabetes -     Comprehensive metabolic panel; Future -     Hemoglobin A1c; Future  Hyperlipidemia with target low density lipoprotein (LDL) cholesterol less than 100 mg/dL -     Lipid panel; Future -     LDL cholesterol, direct; Future  Screening mammogram for breast cancer -     3D Screening Mammogram, Left and Right; Future  Other orders -     buPROPion HCl ER (XL); Take 1 tablet (300 mg total) by mouth daily.  Dispense: 90 tablet; Refill: 2 -     Gabapentin; TAKE 1 CAPSULE BY MOUTH 4 TIMES DAILY  Dispense: 360 capsule; Refill: 1     I provided 30 minutes of face-to-face time during this encounter reviewing patient's last visit with me, patient's  most recent visit with cardiology,  nephrology,  and neurology,  recent surgical and non surgical procedures, previous  labs and imaging studies, counseling on currently addressed issues,  and post visit ordering to diagnostics and therapeutics .   Follow-up: No follow-ups on file.   Crecencio Mc, MD

## 2022-03-14 NOTE — Telephone Encounter (Signed)
Dana Burke from [facility] called in to ger verification on a med that was send over to them today.. she's available '@[phone]'$ [phone].

## 2022-03-14 NOTE — Patient Instructions (Addendum)
Medicare Annual Wellness Visit is due around 07/05/2022, please the schedule this appointment at checkout.  I want you to start  taking a baby aspirin  daily  and 25 mg of metoprolol once daily in the evening  Use the nitroglycerin as needed for chest pain ,  if the second dose doesn't relieve the chest pain   call 911   I am upgrading your  cholesterol  medication to atorvastatin 40 mg daily .  Stop the simvastatin  [facility] will be calling you to set up appt with Ellyn Hack or colleague

## 2022-03-14 NOTE — Assessment & Plan Note (Addendum)
Need to rule out CAD given atherosclerosis of coronaries noted Oct 2023 CT chest.  I  have ordered and reviewed a 12 lead EKG and find that there are no acute changes and patient is in sinus rhythm. With a RBBB

## 2022-03-15 ENCOUNTER — Ambulatory Visit: Payer: Medicare Other | Attending: Cardiology | Admitting: Cardiology

## 2022-03-15 ENCOUNTER — Other Ambulatory Visit
Admission: RE | Admit: 2022-03-15 | Discharge: 2022-03-15 | Disposition: A | Discharge status: Home / Self Care | Payer: Medicare Other | Source: Ambulatory Visit | Attending: Cardiology | Admitting: Cardiology

## 2022-03-15 ENCOUNTER — Encounter: Payer: Self-pay | Admitting: Cardiology

## 2022-03-15 VITALS — BP 108/68 | HR 71 | Ht 71.0 in | Wt 242.0 lb

## 2022-03-15 DIAGNOSIS — E785 Hyperlipidemia, unspecified: Secondary | ICD-10-CM | POA: Diagnosis not present

## 2022-03-15 DIAGNOSIS — I499 Cardiac arrhythmia, unspecified: Secondary | ICD-10-CM | POA: Diagnosis not present

## 2022-03-15 DIAGNOSIS — E118 Type 2 diabetes mellitus with unspecified complications: Secondary | ICD-10-CM | POA: Diagnosis not present

## 2022-03-15 DIAGNOSIS — I25110 Atherosclerotic heart disease of native coronary artery with unstable angina pectoris: Secondary | ICD-10-CM | POA: Insufficient documentation

## 2022-03-15 DIAGNOSIS — I200 Unstable angina: Secondary | ICD-10-CM

## 2022-03-15 LAB — CBC
HCT: 42 % (ref 36.0–46.0)
Hemoglobin: 13.8 g/dL (ref 12.0–15.0)
MCH: 30.6 pg (ref 26.0–34.0)
MCHC: 32.9 g/dL (ref 30.0–36.0)
MCV: 93.1 fL (ref 80.0–100.0)
Platelets: 210 10*3/uL (ref 150–400)
RBC: 4.51 MIL/uL (ref 3.87–5.11)
RDW: 12.7 % (ref 11.5–15.5)
WBC: 6.4 10*3/uL (ref 4.0–10.5)
nRBC: 0 % (ref 0.0–0.2)

## 2022-03-15 LAB — BASIC METABOLIC PANEL
Anion gap: 9 (ref 5–15)
BUN: 16 mg/dL (ref 8–23)
CO2: 26 mmol/L (ref 22–32)
Calcium: 10.2 mg/dL (ref 8.9–10.3)
Chloride: 104 mmol/L (ref 98–111)
Creatinine, Ser: 0.88 mg/dL (ref 0.44–1.00)
GFR, Estimated: >60 mL/min (ref >60)
Glucose, Bld: 119 mg/dL — ABNORMAL HIGH (ref 70–99)
Potassium: 4.5 mmol/L (ref 3.5–5.1)
Sodium: 139 mmol/L (ref 135–145)

## 2022-03-15 NOTE — Assessment & Plan Note (Signed)
Now with concerns for possible progressive angina, her target LDL be less than 70.  Most recent direct LDL 96 but calculated LDL 78.  She was just switched to a high potency statin.  Will need to be reassessed in about 2 to 3 months.

## 2022-03-15 NOTE — Patient Instructions (Addendum)
Medication Instructions:  Your physician recommends that you continue on your current medications as directed. Please refer to the Current Medication list given to you today.   Take 4 tablets 81 mg Aspirin 03/16/22 then 1 tablet daily after that  *If you need a refill on your cardiac medications before your next appointment, please call your pharmacy*   Lab Work: Your physician recommends that you to get lab work: [employer] Entrance at [facility] 1st desk on the right to check in (REGISTRATION)  Lab hours: Monday- Friday (7:30 am- 5:30 pm)  If you have labs (blood work) drawn today and your tests are completely normal, you will receive your results only by: MyChart Message (if you have MyChart) OR A paper copy in the mail If you have any lab test that is abnormal or we need to change your treatment, we will call you to review the results.   Testing/Procedures:  [facility] OF [facility], [place] [address] Dept: [phone] Loc: [facility]  03/15/2022  You are scheduled for a Cardiac Catheterization on Thursday, March 7 with Dr. Glenetta Hew.  1. Please arrive at the [facility], [place], [place] at 9:30 am (This is 1 hour prior to your procedure time).  Proceed to the Check-In Desk directly inside the entrance.  Procedure Parking: Use the entrance off of the [place] side of the hospital. Turn right upon entering and follow the driveway to parking that is directly in front of the [place]. There is no valet parking available at this entrance, however there is an awning directly in front of the [place] for drop off/ pick up for patients  2. Diet: Do not eat solid foods after midnight.  The patient may have clear liquids until 5am upon the day of the  procedure.  3. Labs: See above. You do not need to be fasting.  4. Medication instructions in preparation for your procedure:   Contrast Allergy: No    On the morning of your procedure, take your Aspirin 81 mg and any morning medicines NOT listed above.  You may use sips of water.  5. Plan for one night stay--bring personal belongings. 6. Bring a current list of your medications and current insurance cards. 7. You MUST have a responsible person to drive you home. 8. Someone MUST be with you the first 24 hours after you arrive home or your discharge will be delayed. 9. Please wear clothes that are easy to get on and off and wear slip-on shoes.  Thank you for allowing [place] to care for you!   -- [facility]      Follow-Up: At [facility], you and your health needs are our priority.  As part of our continuing mission to provide you with exceptional heart care, we have created designated Provider Care Teams.  These Care Teams include your primary Cardiologist (physician) and Advanced Practice Providers (APPs -  Physician Assistants and Nurse Practitioners) who all work together to provide you with the care you need, when you need it.  We recommend signing up for the patient portal called "MyChart".  Sign up information is provided on this After Visit Summary.  MyChart is used to connect with patients for Virtual Visits (Telemedicine).  Patients are able  to view lab/test results, encounter notes, upcoming appointments, etc.  Non-urgent messages can be sent to your provider as well.   To learn more about what you can do with MyChart, go to [email or web address].    Your next appointment:   Follow-up after heart cath  Provider:   Glenetta Hew, MD    Other Instructions None

## 2022-03-15 NOTE — H&P (View-Only) (Signed)
Primary Care Provider: Crecencio Mc, [place] Cardiologist: Glenetta Hew, MD Electrophysiologist: None  Clinic Note: Chief Complaint  Patient presents with   Chest Pain    Patient c/o breaking out into sweats, chest pain and shortness of breath with exertion. Medications reviewed by the patient verbally.     ===================================  ASSESSMENT/PLAN   Problem List Items Addressed This Visit       Cardiology Problems   Unstable angina pectoris due to coronary arteriosclerosis ([place]) - Primary (Chronic)    Quite concerning relatively new onset exertional chest discomfort and dyspnea.  Comes on with just routine housework and short walking distances.  Relatively unremarkable EKG although now complete RBBB as opposed to incomplete RBBB.  PCP to start aspirin have asked that she take her next dose of as 4 tablets daily and then go back to 81 mg daily.  Continue the beta-blocker and the high potency statin.  She has not had to use nitroglycerin yet.  If she has resting symptoms or if they happen with minimal activity, I recommended ER visit. Otherwise, wit symptoms very concerning for progressive angina, we discussed options for either invasive or noninvasive evaluation and she would be in favor of invasive approach.  Definitely reasonable based on what likely at least Class III angina.  As of now, plan will be to schedule her for cardiac catheterization here at [facility] in 1 week's time which would be February 7. I did explain that if high risk intervention is required, that we would have her transferred to [facility], and if there is possibility of referral for CABG that will be done in the outpatient setting.  Plan: Left Heart Catheterization with Native Coronary Angiography and Possible Percutaneous Coronary Intervention Take 4 x 81 mg ASA tomorrow morning and then return back to 80 mg daily Continue with the change of Lipitor 40 mg daily and Toprol 25  mg daily.  Shared Decision Making/Informed Consent The risks [stroke (1 in 1000), death (1 in 1000), kidney failure [usually temporary] (1 in 500), bleeding (1 in 200), allergic reaction [possibly serious] (1 in 200)], benefits (diagnostic support and management of coronary artery disease) and alternatives of a cardiac catheterization were discussed in detail with Dana Burke and she is willing to proceed.       Relevant Orders   EKG 12-Lead (Completed)   CBC (Completed)   Basic Metabolic Panel (BMET) (Completed)   Hyperlipidemia with target low density lipoprotein (LDL) cholesterol less than 100 mg/dL (Chronic)    Now with concerns for possible progressive angina, her target LDL be less than 70.  Most recent direct LDL 96 but calculated LDL 78.  She was just switched to a high potency statin.  Will need to be reassessed in about 2 to 3 months.        Other   Irregular heartbeat (Chronic)    Seems stable.  We are now adding beta-blocker for concerns for unstable angina.      Diabetes mellitus type II, non insulin dependent (HCC) (Chronic)    A1c just checked is 7.3 so she clearly has diabetes diagnosis. Has yet to discuss with PCP, I suspect that she would probably benefit from metformin but would not want to start till post-cath.  Would also benefit from SGLT2 inhibitor if indeed she has CAD.  Also with obesity, GLP-1 agonist beneficial as well.      ===================================  HPI:    Dana Burke is an obese 77  y.o. 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 She returns today for urgent follow-up at the request of Crecencio Mc, MD.  Dana Burke was last seen on December 22, 2020 for evaluation of irregular heartbeat/possible arrhythmia.  EKG at baseline showed RBBB those  chronic.  She had a relatively normal echocardiogram in 2020 (see [place]).  I ordered a Zio patch monitor that was essentially normal.  Rare PACs and PVCs and relatively normal heart rate range..  Recent Hospitalizations: None  STEMI was just seen on March 14, 2022 by Dr. Derrel Nip with complaint of shortness of breath.  She noted recent onset of exertional dyspnea with doing housework including sweeping and vacuuming.  This is been over the last month or so and associated with chest heaviness.  Also noted hot flashes and upper body sweating. => Was told to start taking aspirin 81 mg daily and metoprolol succinate 25 mg daily.  Converted from simvastatin to atorvastatin 40 mg, and given prescription for sublingual NTG.  Referred to Cardiology.  Reviewed  CV studies:    The following studies were reviewed today: (if available, images/films reviewed: From Epic Chart or Care Everywhere) CT Chest (super D): Scattered pulmonary nodules measuring up to 8 mm in the LLL-unchanged from March 2023.   Interval History:   Dana Burke presents here today for evaluation of somewhat concerning symptoms exertional dyspnea and chest tightness that started about a month or so ago.  She has noted that these episodes are happening maybe with increased frequency and intensity but not yet at rest and not with minimal activity.  She said that she did get short of breath and had a little bit of tightness in her chest simply walking [place], but did not have it walking from the waiting area into the clinic room. She has not had any chest tightness pressure or short of breath at rest.  No PND, orthopnea but has had some intermittent swelling.  She is also been having occasional sweating episodes that often times her with exertion and sometimes at night sweats as well.  This has been since her surgery.  She did tell me that the palpitations that she was feeling back in 2022 seem to have  improved after her surgery for abdominal mass.  She had exploratory laparoscopic abdominal surgery that identified 2 masses weighing almost 13 pounds combined -this was removed but most of her internal organs were displaced and there were several issues going forward.  Since then though her palpitations improved.  Otherwise cardiac review of symptoms seem to be relatively normal CV Review of Symptoms (Summary): positive for - chest pain, dyspnea on exertion, edema, palpitations, and night sweats and occasional episodes of dizziness. negative for - irregular heartbeat, orthopnea, paroxysmal nocturnal dyspnea, rapid heart rate, or syncope/near syncope or TIA/amaurosis fugax, claudication.  No angina decubitus.  REVIEWED OF SYSTEMS   Review of Systems  Constitutional:  Negative for malaise/fatigue (Has been less active since onset of symptoms) and weight loss.  HENT:  Negative for congestion.   Respiratory:  Positive for shortness of breath (Per HPI). Negative for cough.   Cardiovascular:        HPI  Gastrointestinal:  Negative for abdominal pain, blood in stool, constipation, heartburn and melena.  Genitourinary:  Negative for dysuria and  hematuria.  Musculoskeletal:  Positive for joint pain. Negative for falls and myalgias.  Neurological:  Positive for dizziness. Negative for focal weakness and headaches.  Psychiatric/Behavioral: Negative.      I have reviewed and (if needed) personally updated the patient's problem list, medications, allergies, past medical and surgical history, social and family history.   PAST MEDICAL HISTORY   Past Medical History:  Diagnosis Date   Actinic keratosis    Breast cancer ([place])    Depression    recent events   Dysphonia    GERD (gastroesophageal reflux disease)    Hyperlipidemia    Otitis media with rupture of tympanic membrane, left 02/13/2018   Phlebitis and thrombophlebitis of the leg    Prediabetes    Pulmonary embolism ([place])    Thyroglossal  cyst 06/22/2015    PAST SURGICAL HISTORY   Past Surgical History:  Procedure Laterality Date   APPENDECTOMY  1963   BREAST BIOPSY Left 11/10/2015   benign   [place]  2001   left tibia   THYROGLOSSAL DUCT CYST N/A 06/22/2015   Procedure: THYROGLOSSAL DUCT CYST;  Surgeon: Clyde Canterbury, MD;  Location: [facility] ORS;  Service: ENT;  Laterality: N/A;   TRANSTHORACIC ECHOCARDIOGRAM  08/2018   EF 60 to 65%.  GR 1 DD.  Normal RV size and function.  Mild LA dilation.  Normal valves.   MEDICATIONS/ALLERGIES   Current Meds  Medication Sig   acetaminophen (TYLENOL) 325 MG tablet Take by mouth.   albuterol (VENTOLIN HFA) 108 (90 Base) MCG/ACT inhaler Inhale into the lungs every 6 (six) hours as needed for wheezing or shortness of breath.   atorvastatin (LIPITOR) 40 MG tablet Take 1 tablet (40 mg total) by mouth daily.   benzonatate (TESSALON) 100 MG capsule Take 1 capsule (100 mg total) by mouth 3 (three) times daily as needed for cough.   buPROPion (WELLBUTRIN XL) 300 MG 24 hr tablet Take 1 tablet (300 mg total) by mouth daily.   calcium carbonate (OS-CAL) 600 MG TABS Take 600 mg by mouth 2 (two) times daily with a meal.   cetirizine (ZYRTEC) 10 MG tablet Take 10 mg by mouth at bedtime.    Cholecalciferol (VITAMIN D-3) 1000 UNITS CAPS Take 1 capsule by mouth daily.   Docusate Sodium (COLACE PO) Take 1 capsule by mouth daily.   escitalopram (LEXAPRO) 10 MG tablet TAKE 1 TABLET BY MOUTH ONCE DAILY   esomeprazole (NEXIUM) 40 MG capsule TAKE 1 CAPSULE BY MOUTH ONCE DAILY AT NOON   gabapentin (NEURONTIN) 300 MG capsule TAKE 1 CAPSULE BY MOUTH 4 TIMES DAILY   Magnesium Citrate 125 MG CAPS Take 2 capsules by mouth every evening.   Melatonin 10 MG TABS Take 1 tablet by mouth.   metoprolol succinate (TOPROL-XL) 25 MG 24 hr tablet Take 1 tablet (25 mg total) by mouth daily.   Multiple Vitamins-Minerals (MULTIVITAMIN WITH MINERALS) tablet Take 1 tablet by mouth daily.    nitroGLYCERIN (NITROSTAT) 0.4 MG SL tablet Place 1 tablet (0.4 mg total) under the tongue every 5 (five) minutes as needed for chest pain.    No Known Allergies  SOCIAL HISTORY/FAMILY HISTORY   Reviewed in Epic:  Pertinent findings:  Social History   Tobacco Use   Smoking status: Former    Packs/day: 0.40    Years: 2.00    Total pack years: 0.80    Types: Cigarettes    Quit date: 07/30/1987    Years since quitting: 34.6  Smokeless tobacco: Never  Vaping Use   Vaping Use: Never used  Substance Use Topics   Alcohol use: No   Drug use: No   Social History   Social History Narrative   She is a retired Marine scientist.   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 -- Now is helping her brother care for his wife - now on Hospice.    OBJCTIVE -PE, EKG, labs   Wt Readings from Last 3 Encounters:  03/15/22 242 lb (109.8 kg)  03/14/22 239 lb 3.2 oz (108.5 kg)  11/08/21 233 lb 12.8 oz (106.1 kg)    Physical Exam: Physical Exam Vitals reviewed.  Constitutional:      General: She is not in acute distress.    Appearance: Normal appearance. She is obese. She is not ill-appearing or toxic-appearing.  HENT:     Head: Normocephalic and atraumatic.  Neck:     Vascular: No carotid bruit.  Cardiovascular:     Rate and Rhythm: Normal rate and regular rhythm.     Pulses: Normal pulses.     Heart sounds: Normal heart sounds. No murmur heard.    No friction rub. No gallop.     Comments: Normal S1 with a physiologically split S2 Pulmonary:     Effort: Pulmonary effort is normal. No respiratory distress.     Breath sounds: Normal breath sounds. No wheezing, rhonchi or rales.  Chest:     Chest wall: Tenderness (She does have some reproducible chest pain, but not what she is feeling when she dyspnea.) present.  Abdominal:     General: Abdomen is flat. Bowel sounds are normal. There is no distension.     Palpations: Abdomen is soft. There is no mass.      Tenderness: There is no abdominal tenderness.     Comments: Obese.  No HSM or bruit.  Musculoskeletal:        General: No swelling. Normal range of motion.     Cervical back: Normal range of motion and neck supple.  Skin:    General: Skin is warm and dry.  Neurological:     General: No focal deficit present.     Mental Status: She is alert and oriented to person, place, and time.     Cranial Nerves: No cranial nerve deficit.     Gait: Gait normal.  Psychiatric:        Mood and Affect: Mood normal.        Behavior: Behavior normal.        Thought Content: Thought content normal.        Judgment: Judgment normal.     Adult ECG Report  Rate: 71;  Rhythm: normal sinus rhythm and RBBB ;   Narrative Interpretation: Previous EKG.  Incomplete RBBB in 2022.  Now complete artery.  Recent Labs: Reviewed  Lab Results  Component Value Date   CHOL 142 03/09/2022   HDL 35.00 (L) 03/09/2022   LDLCALC 78 03/09/2022   LDLDIRECT 96.0 03/09/2022   TRIG 143.0 03/09/2022   CHOLHDL 4 03/09/2022   Lab Results  Component Value Date   CREATININE 0.88 03/15/2022   BUN 16 03/15/2022   NA 139 03/15/2022   K 4.5 03/15/2022   CL 104 03/15/2022   CO2 26 03/15/2022      Latest Ref Rng & Units 03/15/2022   10:42 AM 08/01/2021    9:53 AM 12/14/2020   11:10 AM  CBC  WBC 4.0 - 10.5  K/uL 6.4  5.6  5.3   Hemoglobin 12.0 - 15.0 g/dL 13.8  12.9  14.0   Hematocrit 36.0 - 46.0 % 42.0  38.3  42.8   Platelets 150 - 400 K/uL 210  261.0  218.0     Lab Results  Component Value Date   HGBA1C 7.2 (H) 03/09/2022   Lab Results  Component Value Date   TSH 1.19 12/14/2020    ================================================== I spent a total of 41 minutes with the patient spent in direct patient consultation.  Additional time spent with chart review  / charting (studies, outside notes, etc): 29 min Total Time: 70 min  Current medicines are reviewed at length with the patient today.  (+/- concerns)  N/A  Notice: This dictation was prepared with Dragon dictation along with smart phrase technology. Any transcriptional errors that result from this process are unintentional and may not be corrected upon review.  Studies Ordered:  Orders Placed This Encounter  Procedures   CBC   Basic Metabolic Panel (BMET)   EKG 12-Lead   No orders of the defined types were placed in this encounter.   Patient Instructions / Medication Changes & Studies & Tests Ordered   Patient Instructions  Medication Instructions:  Your physician recommends that you continue on your current medications as directed. Please refer to the Current Medication list given to you today.   Take 4 tablets 81 mg Aspirin 03/16/22 then 1 tablet daily after that  *If you need a refill on your cardiac medications before your next appointment, please call your pharmacy*   Lab Work: Your physician recommends that you to get lab work: [place] Entrance at [facility] 1st desk on the right to check in (REGISTRATION)  Lab hours: Monday- Friday (7:30 am- 5:30 pm)  If you have labs (blood work) drawn today and your tests are completely normal, you will receive your results only by: MyChart Message (if you have MyChart) OR A paper copy in the mail If you have any lab test that is abnormal or we need to change your treatment, we will call you to review the results.   Testing/Procedures:  [place] [facility] OF [place] [facility] [place], [place] [id number] Dept: [phone] Loc: [place]  03/15/2022  You are scheduled for a Cardiac Catheterization on Thursday, March 7 with Dr. Glenetta Hew.  1. Please arrive at the [place], [place], [place] at 9:30 am (This is 1 hour prior to your procedure time).  Proceed to the Check-In Desk directly inside the  entrance.  Procedure Parking: Use the entrance off of the [place] side of the hospital. Turn right upon entering and follow the driveway to parking that is directly in front of the [place]. There is no valet parking available at this entrance, however there is an awning directly in front of the [place] for drop off/ pick up for patients  2. Diet: Do not eat solid foods after midnight.  The patient may have clear liquids until 5am upon the day of the procedure.  3. Labs: See above. You do not need to be fasting.  4. Medication instructions in preparation for your procedure:   Contrast Allergy: No    On the morning of your procedure, take your Aspirin 81 mg and any morning medicines  NOT listed above.  You may use sips of water.  5. Plan for one night stay--bring personal belongings. 6. Bring a current list of your medications and current insurance cards. 7. You MUST have a responsible person to drive you home. 8. Someone MUST be with you the first 24 hours after you arrive home or your discharge will be delayed. 9. Please wear clothes that are easy to get on and off and wear slip-on shoes.  Thank you for allowing [place] to care for you!   -- [place] [facility]      Follow-Up: At [facility], you and your health needs are our priority.  As part of our continuing mission to provide you with exceptional heart care, we have created designated Provider Care Teams.  These Care Teams include your primary Cardiologist (physician) and Advanced Practice Providers (APPs -  Physician Assistants and Nurse Practitioners) who all work together to provide you with the care you need, when you need it.  We recommend signing up for the patient portal called "MyChart".  Sign up information is provided on this After Visit Summary.  MyChart is used to connect with patients for Virtual Visits (Telemedicine).  Patients are able to view  lab/test results, encounter notes, upcoming appointments, etc.  Non-urgent messages can be sent to your provider as well.   To learn more about what you can do with MyChart, go to [email or web address].    Your next appointment:   Follow-up after heart cath  Provider:   Glenetta Hew, MD    Other Instructions None        Leonie Man, MD, MS Glenetta Hew, M.D., M.S. Interventional Cardiologist  [facility]   [address] [place], [place]  [address] [phone]           Fax [phone]    Thank you for choosing [facility] in [place]!!

## 2022-03-15 NOTE — Progress Notes (Signed)
Primary Care Provider: Crecencio Mc, [place] Cardiologist: Glenetta Hew, MD Electrophysiologist: None  Clinic Note: Chief Complaint  Patient presents with   Chest Pain    Patient c/o breaking out into sweats, chest pain and shortness of breath with exertion. Medications reviewed by the patient verbally.     ===================================  ASSESSMENT/PLAN   Problem List Items Addressed This Visit       Cardiology Problems   Unstable angina pectoris due to coronary arteriosclerosis ([place]) - Primary (Chronic)    Quite concerning relatively new onset exertional chest discomfort and dyspnea.  Comes on with just routine housework and short walking distances.  Relatively unremarkable EKG although now complete RBBB as opposed to incomplete RBBB.  PCP to start aspirin have asked that she take her next dose of as 4 tablets daily and then go back to 81 mg daily.  Continue the beta-blocker and the high potency statin.  She has not had to use nitroglycerin yet.  If she has resting symptoms or if they happen with minimal activity, I recommended ER visit. Otherwise, wit symptoms very concerning for progressive angina, we discussed options for either invasive or noninvasive evaluation and she would be in favor of invasive approach.  Definitely reasonable based on what likely at least Class III angina.  As of now, plan will be to schedule her for cardiac catheterization here at [facility] in 1 week's time which would be February 7. I did explain that if high risk intervention is required, that we would have her transferred to [facility], and if there is possibility of referral for CABG that will be done in the outpatient setting.  Plan: Left Heart Catheterization with Native Coronary Angiography and Possible Percutaneous Coronary Intervention Take 4 x 81 mg ASA tomorrow morning and then return back to 80 mg daily Continue with the change of Lipitor 40 mg daily and Toprol 25  mg daily.  Shared Decision Making/Informed Consent The risks [stroke (1 in 1000), death (1 in 1000), kidney failure [usually temporary] (1 in 500), bleeding (1 in 200), allergic reaction [possibly serious] (1 in 200)], benefits (diagnostic support and management of coronary artery disease) and alternatives of a cardiac catheterization were discussed in detail with Ms. Janes and she is willing to proceed.       Relevant Orders   EKG 12-Lead (Completed)   CBC (Completed)   Basic Metabolic Panel (BMET) (Completed)   Hyperlipidemia with target low density lipoprotein (LDL) cholesterol less than 100 mg/dL (Chronic)    Now with concerns for possible progressive angina, her target LDL be less than 70.  Most recent direct LDL 96 but calculated LDL 78.  She was just switched to a high potency statin.  Will need to be reassessed in about 2 to 3 months.        Other   Irregular heartbeat (Chronic)    Seems stable.  We are now adding beta-blocker for concerns for unstable angina.      Diabetes mellitus type II, non insulin dependent (HCC) (Chronic)    A1c just checked is 7.3 so she clearly has diabetes diagnosis. Has yet to discuss with PCP, I suspect that she would probably benefit from metformin but would not want to start till post-cath.  Would also benefit from SGLT2 inhibitor if indeed she has CAD.  Also with obesity, GLP-1 agonist beneficial as well.      ===================================  HPI:    Dana Burke is an obese 77  y.o. 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 She returns today for urgent follow-up at the request of Crecencio Mc, MD.  Drake Leach Arana was last seen on December 22, 2020 for evaluation of irregular heartbeat/possible arrhythmia.  EKG at baseline showed RBBB those  chronic.  She had a relatively normal echocardiogram in 2020 (see Ramos).  I ordered a Zio patch monitor that was essentially normal.  Rare PACs and PVCs and relatively normal heart rate range..  Recent Hospitalizations: None  STEMI was just seen on March 14, 2022 by Dr. Derrel Nip with complaint of shortness of breath.  She noted recent onset of exertional dyspnea with doing housework including sweeping and vacuuming.  This is been over the last month or so and associated with chest heaviness.  Also noted hot flashes and upper body sweating. => Was told to start taking aspirin 81 mg daily and metoprolol succinate 25 mg daily.  Converted from simvastatin to atorvastatin 40 mg, and given prescription for sublingual NTG.  Referred to Cardiology.  Reviewed  CV studies:    The following studies were reviewed today: (if available, images/films reviewed: From Epic Chart or Care Everywhere) CT Chest (super D): Scattered pulmonary nodules measuring up to 8 mm in the LLL-unchanged from March 2023.   Interval History:   Joselyn Sparlin presents here today for evaluation of somewhat concerning symptoms exertional dyspnea and chest tightness that started about a month or so ago.  She has noted that these episodes are happening maybe with increased frequency and intensity but not yet at rest and not with minimal activity.  She said that she did get short of breath and had a little bit of tightness in her chest simply walking [place] the [place], but did not have it walking from the waiting area into the clinic room. She has not had any chest tightness pressure or short of breath at rest.  No PND, orthopnea but has had some intermittent swelling.  She is also been having occasional sweating episodes that often times her with exertion and sometimes at night sweats as well.  This has been since her surgery.  She did tell me that the palpitations that she was feeling back in 2022 seem to have  improved after her surgery for abdominal mass.  She had exploratory laparoscopic abdominal surgery that identified 2 masses weighing almost 13 pounds combined -this was removed but most of her internal organs were displaced and there were several issues going forward.  Since then though her palpitations improved.  Otherwise cardiac review of symptoms seem to be relatively normal CV Review of Symptoms (Summary): positive for - chest pain, dyspnea on exertion, edema, palpitations, and night sweats and occasional episodes of dizziness. negative for - irregular heartbeat, orthopnea, paroxysmal nocturnal dyspnea, rapid heart rate, or syncope/near syncope or TIA/amaurosis fugax, claudication.  No angina decubitus.  REVIEWED OF SYSTEMS   Review of Systems  Constitutional:  Negative for malaise/fatigue (Has been less active since onset of symptoms) and weight loss.  HENT:  Negative for congestion.   Respiratory:  Positive for shortness of breath (Per HPI). Negative for cough.   Cardiovascular:        HPI  Gastrointestinal:  Negative for abdominal pain, blood in stool, constipation, heartburn and melena.  Genitourinary:  Negative for dysuria and  hematuria.  Musculoskeletal:  Positive for joint pain. Negative for falls and myalgias.  Neurological:  Positive for dizziness. Negative for focal weakness and headaches.  Psychiatric/Behavioral: Negative.      I have reviewed and (if needed) personally updated the patient's problem list, medications, allergies, past medical and surgical history, social and family history.   PAST MEDICAL HISTORY   Past Medical History:  Diagnosis Date   Actinic keratosis    Breast cancer ([place])    Depression    recent events   Dysphonia    GERD (gastroesophageal reflux disease)    Hyperlipidemia    Otitis media with rupture of tympanic membrane, left 02/13/2018   Phlebitis and thrombophlebitis of the leg    Prediabetes    Pulmonary embolism ([place])    Thyroglossal  cyst 06/22/2015    PAST SURGICAL HISTORY   Past Surgical History:  Procedure Laterality Date   APPENDECTOMY  1963   BREAST BIOPSY Left 11/10/2015   benign   [place]  2001   left tibia   THYROGLOSSAL DUCT CYST N/A 06/22/2015   Procedure: THYROGLOSSAL DUCT CYST;  Surgeon: Clyde Canterbury, MD;  Location: [facility] ORS;  Service: ENT;  Laterality: N/A;   TRANSTHORACIC ECHOCARDIOGRAM  08/2018   EF 60 to 65%.  GR 1 DD.  Normal RV size and function.  Mild LA dilation.  Normal valves.   MEDICATIONS/ALLERGIES   Current Meds  Medication Sig   acetaminophen (TYLENOL) 325 MG tablet Take by mouth.   albuterol (VENTOLIN HFA) 108 (90 Base) MCG/ACT inhaler Inhale into the lungs every 6 (six) hours as needed for wheezing or shortness of breath.   atorvastatin (LIPITOR) 40 MG tablet Take 1 tablet (40 mg total) by mouth daily.   benzonatate (TESSALON) 100 MG capsule Take 1 capsule (100 mg total) by mouth 3 (three) times daily as needed for cough.   buPROPion (WELLBUTRIN XL) 300 MG 24 hr tablet Take 1 tablet (300 mg total) by mouth daily.   calcium carbonate (OS-CAL) 600 MG TABS Take 600 mg by mouth 2 (two) times daily with a meal.   cetirizine (ZYRTEC) 10 MG tablet Take 10 mg by mouth at bedtime.    Cholecalciferol (VITAMIN D-3) 1000 UNITS CAPS Take 1 capsule by mouth daily.   Docusate Sodium (COLACE PO) Take 1 capsule by mouth daily.   escitalopram (LEXAPRO) 10 MG tablet TAKE 1 TABLET BY MOUTH ONCE DAILY   esomeprazole (NEXIUM) 40 MG capsule TAKE 1 CAPSULE BY MOUTH ONCE DAILY AT NOON   gabapentin (NEURONTIN) 300 MG capsule TAKE 1 CAPSULE BY MOUTH 4 TIMES DAILY   Magnesium Citrate 125 MG CAPS Take 2 capsules by mouth every evening.   Melatonin 10 MG TABS Take 1 tablet by mouth.   metoprolol succinate (TOPROL-XL) 25 MG 24 hr tablet Take 1 tablet (25 mg total) by mouth daily.   Multiple Vitamins-Minerals (MULTIVITAMIN WITH MINERALS) tablet Take 1 tablet by mouth daily.    nitroGLYCERIN (NITROSTAT) 0.4 MG SL tablet Place 1 tablet (0.4 mg total) under the tongue every 5 (five) minutes as needed for chest pain.    No Known Allergies  SOCIAL HISTORY/FAMILY HISTORY   Reviewed in Epic:  Pertinent findings:  Social History   Tobacco Use   Smoking status: Former    Packs/day: 0.40    Years: 2.00    Total pack years: 0.80    Types: Cigarettes    Quit date: 07/30/1987    Years since quitting: 34.6  Smokeless tobacco: Never  Vaping Use   Vaping Use: Never used  Substance Use Topics   Alcohol use: No   Drug use: No   Social History   Social History Narrative   She is a retired Marine scientist.   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 -- Now is helping her brother care for his wife - now on Hospice.    OBJCTIVE -PE, EKG, labs   Wt Readings from Last 3 Encounters:  03/15/22 242 lb (109.8 kg)  03/14/22 239 lb 3.2 oz (108.5 kg)  11/08/21 233 lb 12.8 oz (106.1 kg)    Physical Exam: Physical Exam Vitals reviewed.  Constitutional:      General: She is not in acute distress.    Appearance: Normal appearance. She is obese. She is not ill-appearing or toxic-appearing.  HENT:     Head: Normocephalic and atraumatic.  Neck:     Vascular: No carotid bruit.  Cardiovascular:     Rate and Rhythm: Normal rate and regular rhythm.     Pulses: Normal pulses.     Heart sounds: Normal heart sounds. No murmur heard.    No friction rub. No gallop.     Comments: Normal S1 with a physiologically split S2 Pulmonary:     Effort: Pulmonary effort is normal. No respiratory distress.     Breath sounds: Normal breath sounds. No wheezing, rhonchi or rales.  Chest:     Chest wall: Tenderness (She does have some reproducible chest pain, but not what she is feeling when she dyspnea.) present.  Abdominal:     General: Abdomen is flat. Bowel sounds are normal. There is no distension.     Palpations: Abdomen is soft. There is no mass.      Tenderness: There is no abdominal tenderness.     Comments: Obese.  No HSM or bruit.  Musculoskeletal:        General: No swelling. Normal range of motion.     Cervical back: Normal range of motion and neck supple.  Skin:    General: Skin is warm and dry.  Neurological:     General: No focal deficit present.     Mental Status: She is alert and oriented to person, place, and time.     Cranial Nerves: No cranial nerve deficit.     Gait: Gait normal.  Psychiatric:        Mood and Affect: Mood normal.        Behavior: Behavior normal.        Thought Content: Thought content normal.        Judgment: Judgment normal.     Adult ECG Report  Rate: 71;  Rhythm: normal sinus rhythm and RBBB ;   Narrative Interpretation: Previous EKG.  Incomplete RBBB in 2022.  Now complete artery.  Recent Labs: Reviewed  Lab Results  Component Value Date   CHOL 142 03/09/2022   HDL 35.00 (L) 03/09/2022   LDLCALC 78 03/09/2022   LDLDIRECT 96.0 03/09/2022   TRIG 143.0 03/09/2022   CHOLHDL 4 03/09/2022   Lab Results  Component Value Date   CREATININE 0.88 03/15/2022   BUN 16 03/15/2022   NA 139 03/15/2022   K 4.5 03/15/2022   CL 104 03/15/2022   CO2 26 03/15/2022      Latest Ref Rng & Units 03/15/2022   10:42 AM 08/01/2021    9:53 AM 12/14/2020   11:10 AM  CBC  WBC 4.0 - 10.5  K/uL 6.4  5.6  5.3   Hemoglobin 12.0 - 15.0 g/dL 13.8  12.9  14.0   Hematocrit 36.0 - 46.0 % 42.0  38.3  42.8   Platelets 150 - 400 K/uL 210  261.0  218.0     Lab Results  Component Value Date   HGBA1C 7.2 (H) 03/09/2022   Lab Results  Component Value Date   TSH 1.19 12/14/2020    ================================================== I spent a total of 41 minutes with the patient spent in direct patient consultation.  Additional time spent with chart review  / charting (studies, outside notes, etc): 29 min Total Time: 70 min  Current medicines are reviewed at length with the patient today.  (+/- concerns)  N/A  Notice: This dictation was prepared with Dragon dictation along with smart phrase technology. Any transcriptional errors that result from this process are unintentional and may not be corrected upon review.  Studies Ordered:  Orders Placed This Encounter  Procedures   CBC   Basic Metabolic Panel (BMET)   EKG 12-Lead   No orders of the defined types were placed in this encounter.   Patient Instructions / Medication Changes & Studies & Tests Ordered   Patient Instructions  Medication Instructions:  Your physician recommends that you continue on your current medications as directed. Please refer to the Current Medication list given to you today.   Take 4 tablets 81 mg Aspirin 03/16/22 then 1 tablet daily after that  *If you need a refill on your cardiac medications before your next appointment, please call your pharmacy*   Lab Work: Your physician recommends that you to get lab work: [place] Entrance at [facility] 1st desk on the right to check in (REGISTRATION)  Lab hours: Monday- Friday (7:30 am- 5:30 pm)  If you have labs (blood work) drawn today and your tests are completely normal, you will receive your results only by: MyChart Message (if you have MyChart) OR A paper copy in the mail If you have any lab test that is abnormal or we need to change your treatment, we will call you to review the results.   Testing/Procedures:  [facility] OF [place], [place] [id number] Dept: [phone] Loc: [place]  03/15/2022  You are scheduled for a Cardiac Catheterization on Thursday, March 7 with Dr. Glenetta Hew.  1. Please arrive at the [facility], [place], [place] at 9:30 am (This is 1 hour prior to your procedure time).  Proceed to the Check-In Desk directly inside the  entrance.  Procedure Parking: Use the entrance off of the [place] side of the hospital. Turn right upon entering and follow the driveway to parking that is directly in front of the [place]. There is no valet parking available at this entrance, however there is an awning directly in front of the [place] for drop off/ pick up for patients  2. Diet: Do not eat solid foods after midnight.  The patient may have clear liquids until 5am upon the day of the procedure.  3. Labs: See above. You do not need to be fasting.  4. Medication instructions in preparation for your procedure:   Contrast Allergy: No    On the morning of your procedure, take your Aspirin 81 mg and any morning medicines  NOT listed above.  You may use sips of water.  5. Plan for one night stay--bring personal belongings. 6. Bring a current list of your medications and current insurance cards. 7. You MUST have a responsible person to drive you home. 8. Someone MUST be with you the first 24 hours after you arrive home or your discharge will be delayed. 9. Please wear clothes that are easy to get on and off and wear slip-on shoes.  Thank you for allowing [place] to care for you!   -- [place] [facility]      Follow-Up: At [facility], you and your health needs are our priority.  As part of our continuing mission to provide you with exceptional heart care, we have created designated Provider Care Teams.  These Care Teams include your primary Cardiologist (physician) and Advanced Practice Providers (APPs -  Physician Assistants and Nurse Practitioners) who all work together to provide you with the care you need, when you need it.  We recommend signing up for the patient portal called "MyChart".  Sign up information is provided on this After Visit Summary.  MyChart is used to connect with patients for Virtual Visits (Telemedicine).  Patients are able to view  lab/test results, encounter notes, upcoming appointments, etc.  Non-urgent messages can be sent to your provider as well.   To learn more about what you can do with MyChart, go to [email or web address].    Your next appointment:   Follow-up after heart cath  Provider:   Glenetta Hew, MD    Other Instructions None        Leonie Man, MD, MS Glenetta Hew, M.D., M.S. Interventional Cardiologist  [facility]   [address] [place], [place]  [address] [phone]           Fax [phone]    Thank you for choosing [facility] in [place]!!

## 2022-03-15 NOTE — Assessment & Plan Note (Signed)
Quite concerning relatively new onset exertional chest discomfort and dyspnea.  Comes on with just routine housework and short walking distances.  Relatively unremarkable EKG although now complete RBBB as opposed to incomplete RBBB.  PCP to start aspirin have asked that she take her next dose of as 4 tablets daily and then go back to 81 mg daily.  Continue the beta-blocker and the high potency statin.  She has not had to use nitroglycerin yet.  If she has resting symptoms or if they happen with minimal activity, I recommended ER visit. Otherwise, wit symptoms very concerning for progressive angina, we discussed options for either invasive or noninvasive evaluation and she would be in favor of invasive approach.  Definitely reasonable based on what likely at least Class III angina.  As of now, plan will be to schedule her for cardiac catheterization here at [facility] in 1 week's time which would be February 7. I did explain that if high risk intervention is required, that we would have her transferred to [facility], and if there is possibility of referral for CABG that will be done in the outpatient setting.  Plan: Left Heart Catheterization with Native Coronary Angiography and Possible Percutaneous Coronary Intervention Take 4 x 81 mg ASA tomorrow morning and then return back to 80 mg daily Continue with the change of Lipitor 40 mg daily and Toprol 25 mg daily.  Shared Decision Making/Informed Consent The risks [stroke (1 in 1000), death (1 in 1000), kidney failure [usually temporary] (1 in 500), bleeding (1 in 200), allergic reaction [possibly serious] (1 in 200)], benefits (diagnostic support and management of coronary artery disease) and alternatives of a cardiac catheterization were discussed in detail with Dana Burke and she is willing to proceed.

## 2022-03-15 NOTE — Assessment & Plan Note (Signed)
Confirmed with A1c of 7.2 today.  Will consider merformin/ jardiance post cardiology evaluation.   Lab Results  Component Value Date   HGBA1C 7.2 (H) 03/09/2022

## 2022-03-15 NOTE — Assessment & Plan Note (Addendum)
A1c just checked is 7.3 so she clearly has diabetes diagnosis. Has yet to discuss with PCP, I suspect that she would probably benefit from metformin but would not want to start till post-cath.  Would also benefit from SGLT2 inhibitor if indeed she has CAD.  Also with obesity, GLP-1 agonist beneficial as well.

## 2022-03-15 NOTE — Assessment & Plan Note (Signed)
Seems stable.  We are now adding beta-blocker for concerns for unstable angina.

## 2022-03-16 ENCOUNTER — Encounter: Payer: Self-pay | Admitting: Pulmonary Disease

## 2022-03-22 ENCOUNTER — Other Ambulatory Visit: Payer: Self-pay

## 2022-03-22 ENCOUNTER — Encounter: Payer: Self-pay | Admitting: Cardiology

## 2022-03-22 ENCOUNTER — Other Ambulatory Visit: Payer: Self-pay | Admitting: Cardiology

## 2022-03-22 ENCOUNTER — Ambulatory Visit
Admission: RE | Admit: 2022-03-22 | Discharge: 2022-03-22 | Disposition: A | Discharge status: Home / Self Care | Payer: Medicare Other | Attending: Cardiology | Admitting: Cardiology

## 2022-03-22 ENCOUNTER — Encounter
Admission: RE | Disposition: A | Discharge status: Home / Self Care | Payer: Self-pay | Source: Home / Self Care | Attending: Cardiology

## 2022-03-22 DIAGNOSIS — E669 Obesity, unspecified: Secondary | ICD-10-CM | POA: Diagnosis not present

## 2022-03-22 DIAGNOSIS — I2510 Atherosclerotic heart disease of native coronary artery without angina pectoris: Secondary | ICD-10-CM | POA: Diagnosis present

## 2022-03-22 DIAGNOSIS — I200 Unstable angina: Secondary | ICD-10-CM

## 2022-03-22 DIAGNOSIS — E119 Type 2 diabetes mellitus without complications: Secondary | ICD-10-CM | POA: Diagnosis not present

## 2022-03-22 DIAGNOSIS — Z87891 Personal history of nicotine dependence: Secondary | ICD-10-CM | POA: Insufficient documentation

## 2022-03-22 DIAGNOSIS — I25110 Atherosclerotic heart disease of native coronary artery with unstable angina pectoris: Secondary | ICD-10-CM | POA: Diagnosis not present

## 2022-03-22 DIAGNOSIS — E785 Hyperlipidemia, unspecified: Secondary | ICD-10-CM | POA: Diagnosis not present

## 2022-03-22 DIAGNOSIS — I499 Cardiac arrhythmia, unspecified: Secondary | ICD-10-CM | POA: Insufficient documentation

## 2022-03-22 DIAGNOSIS — Z6833 Body mass index (BMI) 33.0-33.9, adult: Secondary | ICD-10-CM | POA: Diagnosis not present

## 2022-03-22 HISTORY — PX: LEFT HEART CATH AND CORONARY ANGIOGRAPHY: CATH118249

## 2022-03-22 SURGERY — LEFT HEART CATH AND CORONARY ANGIOGRAPHY
Anesthesia: Moderate Sedation | Laterality: Left

## 2022-03-22 MED ORDER — HEPARIN (PORCINE) IN NACL 1000-0.9 UT/500ML-% IV SOLN
INTRAVENOUS | Status: DC | PRN
  Administered 2022-03-22 (×2): 500 mL

## 2022-03-22 MED ORDER — FENTANYL CITRATE (PF) 100 MCG/2ML IJ SOLN
INTRAMUSCULAR | Status: AC
  Filled 2022-03-22: qty 2

## 2022-03-22 MED ORDER — SODIUM CHLORIDE 0.9% FLUSH: 3.0000 mL | INTRAVENOUS | Status: DC | PRN

## 2022-03-22 MED ORDER — VERAPAMIL HCL 2.5 MG/ML IV SOLN
INTRAVENOUS | Status: AC
  Filled 2022-03-22: qty 2

## 2022-03-22 MED ORDER — ONDANSETRON HCL 4 MG/2ML IJ SOLN: 4.0000 mg | Freq: Four times a day (QID) | INTRAMUSCULAR | Status: DC | PRN

## 2022-03-22 MED ORDER — MIDAZOLAM HCL 2 MG/2ML IJ SOLN
INTRAMUSCULAR | Status: DC | PRN
  Administered 2022-03-22: 1 mg via INTRAVENOUS

## 2022-03-22 MED ORDER — MIDAZOLAM HCL 2 MG/2ML IJ SOLN
INTRAMUSCULAR | Status: AC
  Filled 2022-03-22: qty 2

## 2022-03-22 MED ORDER — HYDRALAZINE HCL 20 MG/ML IJ SOLN: 10.0000 mg | INTRAMUSCULAR | Status: DC | PRN

## 2022-03-22 MED ORDER — HEPARIN SODIUM (PORCINE) 1000 UNIT/ML IJ SOLN
INTRAMUSCULAR | Status: DC | PRN
  Administered 2022-03-22: 5500 [IU] via INTRAVENOUS

## 2022-03-22 MED ORDER — SODIUM CHLORIDE 0.9% FLUSH: 3.0000 mL | Freq: Two times a day (BID) | INTRAVENOUS | Status: DC

## 2022-03-22 MED ORDER — VERAPAMIL HCL 2.5 MG/ML IV SOLN
INTRAVENOUS | Status: DC | PRN
  Administered 2022-03-22: 2.5 mg via INTRA_ARTERIAL

## 2022-03-22 MED ORDER — ACETAMINOPHEN 325 MG PO TABS: 650.0000 mg | ORAL_TABLET | ORAL | Status: DC | PRN

## 2022-03-22 MED ORDER — SODIUM CHLORIDE 0.9 % WEIGHT BASED INFUSION: 1.0000 mL/kg/h | INTRAVENOUS | Status: DC

## 2022-03-22 MED ORDER — SODIUM CHLORIDE 0.9 % WEIGHT BASED INFUSION
3.0000 mL/kg/h | INTRAVENOUS | Status: AC
  Administered 2022-03-22: 3 mL/kg/h via INTRAVENOUS

## 2022-03-22 MED ORDER — SODIUM CHLORIDE 0.9 % IV SOLN: 250.0000 mL | INTRAVENOUS | Status: DC | PRN

## 2022-03-22 MED ORDER — IOHEXOL 300 MG/ML  SOLN
INTRAMUSCULAR | Status: DC | PRN
  Administered 2022-03-22: 36 mL

## 2022-03-22 MED ORDER — HEPARIN SODIUM (PORCINE) 1000 UNIT/ML IJ SOLN
INTRAMUSCULAR | Status: AC
  Filled 2022-03-22: qty 10

## 2022-03-22 MED ORDER — SODIUM CHLORIDE 0.9 % IV SOLN: INTRAVENOUS | Status: DC

## 2022-03-22 MED ORDER — LABETALOL HCL 5 MG/ML IV SOLN: 10.0000 mg | INTRAVENOUS | Status: DC | PRN

## 2022-03-22 MED ORDER — HEPARIN (PORCINE) IN NACL 1000-0.9 UT/500ML-% IV SOLN
INTRAVENOUS | Status: AC
  Filled 2022-03-22: qty 1000

## 2022-03-22 MED ORDER — ASPIRIN 81 MG PO CHEW: 81.0000 mg | CHEWABLE_TABLET | ORAL | Status: DC

## 2022-03-22 MED ORDER — FENTANYL CITRATE (PF) 100 MCG/2ML IJ SOLN
INTRAMUSCULAR | Status: DC | PRN
  Administered 2022-03-22: 25 ug via INTRAVENOUS

## 2022-03-22 SURGICAL SUPPLY — 11 items
CATH 5F 110X4 TIG (CATHETERS) IMPLANT
CATH INFINITI 5FR ANG PIGTAIL (CATHETERS) IMPLANT
DEVICE RAD TR BAND REGULAR (VASCULAR PRODUCTS) IMPLANT
DRAPE BRACHIAL (DRAPES) IMPLANT
GLIDESHEATH SLEND SS 6F .021 (SHEATH) IMPLANT
GUIDEWIRE INQWIRE 1.5J.035X260 (WIRE) IMPLANT
INQWIRE 1.5J .035X260CM (WIRE) ×1
PACK CARDIAC CATH (CUSTOM PROCEDURE TRAY) ×1 IMPLANT
PROTECTION STATION PRESSURIZED (MISCELLANEOUS) ×1
SET ATX-X65L (MISCELLANEOUS) IMPLANT
STATION PROTECTION PRESSURIZED (MISCELLANEOUS) IMPLANT

## 2022-03-22 NOTE — Discharge Instructions (Signed)
Left Heart Cath, Care After This sheet gives you information about how to care for yourself after your procedure. Your health care provider may also give you more specific instructions. If you have problems or questions, contact your health care provider. What can I expect after the procedure? After the procedure, it is common to have: Bruising or mild discomfort in the area where the IV was inserted (insertion site). Follow these instructions at home: Eating and drinking  You may eat and drink after your procedure.  Drink a lot of fluids for the first several days after the procedure, as directed by your health care provider. This helps to wash (flush) the contrast out of your body. Examples of healthy fluids include water or low-calorie drinks. General instructions Check your IV insertion area and also your venous access site every day for signs of infection. Check for: Redness, swelling, or pain. Fluid or blood. Warmth. Pus or a bad smell. Take over-the-counter and prescription medicines only as told by your health care provider. Rest and return to your normal activities as told by your health care provider. Ask your health care provider what activities are safe for you. Do not drive for 24 hours if you were given a medicine to help you relax (sedative), or until your health care provider approves. Keep all follow-up visits as told by your health care provider. This is important. Contact a health care provider if: Your skin becomes itchy or you develop a rash or hives. You have a fever that does not get better with medicine. You feel nauseous. You vomit. You have redness, swelling, or pain around the insertion site. You have fluid or blood coming from the insertion site. Your insertion area feels warm to the touch. You have pus or a bad smell coming from the insertion site. Get help right away if: You have difficulty breathing or shortness of breath. You develop chest pain. You  faint. You feel very dizzy. These symptoms may represent a serious problem that is an emergency. Do not wait to see if the symptoms will go away. Get medical help right away. Call your local emergency services (911 in the [place] Do not drive yourself to the hospital. Summary After your procedure, it is common to have bruising or mild discomfort in the area where the IV was inserted. You should check your IV insertion area every day for signs of infection. Take over-the-counter and prescription medicines only as told by your health care provider. You should drink a lot of fluids for the first several days after the procedure to help flush the contrast from your body. This information is not intended to replace advice given to you by your health care provider. Make sure you discuss any questions you have with your health care provider. Document Released: 10/22/2012 Document Revised: 12/14/2016 Document Reviewed: 11/26/2015 Elsevier Patient Education  2020 [employer].

## 2022-03-22 NOTE — Interval H&P Note (Signed)
History and Physical Interval Note:  03/22/2022 10:10 AM  Dana Burke  has presented today for surgery, with the diagnosis of L Cath    Unstable angina.  The various methods of treatment have been discussed with the patient and family. After consideration of risks, benefits and other options for treatment, the patient has consented to  Procedure(s): LEFT HEART CATH AND CORONARY ANGIOGRAPHY (Left)  PERCUTANEOUS CORONARY INTERVENTION.   as a surgical intervention.  The patient's history has been reviewed, patient examined, no change in status, stable for surgery.  I have reviewed the patient's chart and labs.  Questions were answered to the patient's satisfaction.    Cath Lab Visit (complete for each Cath Lab visit)  Clinical Evaluation Leading to the Procedure:   ACS: No.  Progressive Angina  Non-ACS:    Anginal Classification: CCS III  Anti-ischemic medical therapy: Minimal Therapy (1 class of medications)  Non-Invasive Test Results: No non-invasive testing performed  Prior CABG: No previous CABG    Glenetta Hew

## 2022-04-25 ENCOUNTER — Encounter: Payer: Self-pay | Admitting: Cardiology

## 2022-04-25 ENCOUNTER — Ambulatory Visit: Payer: Medicare Other | Attending: Cardiology | Admitting: Cardiology

## 2022-04-25 VITALS — BP 118/74 | HR 74 | Ht 71.0 in | Wt 244.6 lb

## 2022-04-25 DIAGNOSIS — E785 Hyperlipidemia, unspecified: Secondary | ICD-10-CM | POA: Diagnosis not present

## 2022-04-25 DIAGNOSIS — I2510 Atherosclerotic heart disease of native coronary artery without angina pectoris: Secondary | ICD-10-CM | POA: Diagnosis not present

## 2022-04-25 DIAGNOSIS — R0609 Other forms of dyspnea: Secondary | ICD-10-CM | POA: Diagnosis not present

## 2022-04-25 DIAGNOSIS — R918 Other nonspecific abnormal finding of lung field: Secondary | ICD-10-CM

## 2022-04-25 DIAGNOSIS — Z6836 Body mass index (BMI) 36.0-36.9, adult: Secondary | ICD-10-CM | POA: Diagnosis not present

## 2022-04-25 DIAGNOSIS — E6609 Other obesity due to excess calories: Secondary | ICD-10-CM

## 2022-04-25 NOTE — Patient Instructions (Signed)
Medication Instructions:   No changes  *If you need a refill on your cardiac medications before your next appointment, please call your pharmacy*   Lab Work: Not needed  If you have labs (blood work) drawn today and your tests are completely normal, you will receive your results only by: MyChart Message (if you have MyChart) OR A paper copy in the mail If you have any lab test that is abnormal or we need to change your treatment, we will call you to review the results.   Testing/Procedures:  Will be schedule  Your physician has requested that you have an echocardiogram. Echocardiography is a painless test that uses sound waves to create images of your heart. It provides your doctor with information about the size and shape of your heart and how well your heart's chambers and valves are working. This procedure takes approximately one hour. There are no restrictions for this procedure. Please do NOT wear cologne, perfume, aftershave, or lotions (deodorant is allowed). Please arrive 15 minutes prior to your appointment time.   And  Will be schedule at [facility] - Heart and vascular  Your physician has recommended that you have a cardiopulmonary stress test (CPX). CPX testing is a non-invasive measurement of heart and lung function. It replaces a traditional treadmill stress test. This type of test provides a tremendous amount of information that relates not only to your present condition but also for future outcomes. This test combines measurements of you ventilation, respiratory gas exchange in the lungs, electrocardiogram (EKG), blood pressure and physical response before, during, and following an exercise protocol.    Follow-Up: At [facility], you and your health needs are our priority.  As part of our continuing mission to provide you with exceptional heart care, we have created designated Provider Care Teams.  These Care Teams include your primary Cardiologist (physician) and  Advanced Practice Providers (APPs -  Physician Assistants and Nurse Practitioners) who all work together to provide you with the care you need, when you need it.     Your next appointment:   2 month(s)  The format for your next appointment:   In Person  Provider:   Bryan Lemma, MD   Other Instructions

## 2022-04-25 NOTE — Progress Notes (Signed)
Primary Care Provider: Sherlene Shams, MD [facility] Cardiologist: Bryan Lemma, MD Electrophysiologist: None  Clinic Note: Chief Complaint  Patient presents with   Hospitalization Follow-up    Post-cath follow-up   Shortness of Breath    Exertional.  Improved but still there  ===================================  ASSESSMENT/PLAN   Problem List Items Addressed This Visit       Cardiology Problems   Hyperlipidemia with target low density lipoprotein (LDL) cholesterol less than 100 mg/dL (Chronic)    Pretty normal coronary arteries.  As long as we can exclude microvascular disease, her LDL of 78 is acceptable.  Continue current dose of atorvastatin 40 mg daily.      Relevant Medications   aspirin EC 81 MG tablet   Other Relevant Orders   Cardiac Stress Test: Informed Consent Details: Physician/Practitioner Attestation; Transcribe to consent form and obtain patient signature   Coronary artery disease, non-occlusive (Chronic)    Interestingly, her symptoms are very concerning for possible angina with exertional dyspnea and chest discomfort.  However her cardiac catheterization revealed relatively normal coronary arteries.  I do not see any evidence of spasm.    Likely noncardiac etiology although we have still not consider microvascular disease.  Plan: Assess for microvascular disease with CPX (Cardiopulmonary Stress Stress Test) and 2D Echo.  The results will be taken into context with her upcoming chest CT      Relevant Medications   aspirin EC 81 MG tablet   Other Relevant Orders   Cardiac Stress Test: Informed Consent Details: Physician/Practitioner Attestation; Transcribe to consent form and obtain patient signature     Other   Obesity (Chronic)    BMI of 34.  Is possible that her exertional dyspnea simply related to obesity and deconditioning.  Exclude cardiac/pulmonary etiology With CPX      Relevant Orders   ECHOCARDIOGRAM COMPLETE    Cardiopulmonary exercise test   Cardiac Stress Test: Informed Consent Details: Physician/Practitioner Attestation; Transcribe to consent form and obtain patient signature   Multiple pulmonary nodules (Chronic)    Underlying lung disease.  Has chest CT upcoming.  Quite possible that her dyspnea is related to pulmonary disease.  Evaluate with 2D Echo and CPX along with upcoming CT of the chest ordered by PCP/pulmonary      Relevant Orders   ECHOCARDIOGRAM COMPLETE   Cardiopulmonary exercise test   Cardiac Stress Test: Informed Consent Details: Physician/Practitioner Attestation; Transcribe to consent form and obtain patient signature   DOE (dyspnea on exertion) - Primary (Chronic)    Previous CT scan showed coronary artery calcification and atherosclerotic aortic disease.  Also evidence of scattered pulmonary parenchymal scarring and multiple nodules.  Has upcoming CT scan ordered on April 22. Echo in the past was normal.  Cardiac catheterization showed normal coronary arteries.  Plan: Further cardiac evaluation with 2D Echo (has been normal 4 years since last study), with normal coronaries, we are safe to proceed with Cardiopulmonary Exercise Stress Testing (CPX).   Shared Decision Making/Informed Consent The risks [chest pain, shortness of breath, cardiac arrhythmias, dizziness, blood pressure fluctuations, myocardial infarction, stroke/transient ischemic attack, and life-threatening complications (estimated to be 1 in 10,000)], benefits (risk stratification, diagnosing coronary artery disease, treatment guidance) and alternatives of an exercise tolerance test were discussed in detail with Dana Burke and she agrees to proceed.       Relevant Orders   ECHOCARDIOGRAM COMPLETE   Cardiopulmonary exercise test   Cardiac Stress Test: Informed Consent Details: Physician/Practitioner Attestation; Transcribe to  consent form and obtain patient signature   ===================================  HPI:     Dana Burke is a 77 y.o. female with a PMH below who presents today for post cath follow-up-evaluation DOE with associated Chest Tightness.  Dana Burke was seen on 03/15/2022 for evaluation of concerning symptoms of exertional dyspnea and chest tightness with minimal activity.  Increased frequency and intensity.  These episodes have been increasing in frequency over the previous month, but before that she has been fine.  Symptoms were always occurring with exertion not at rest.  She did have some chest wall tenderness on exam.  I was concerned for possible progressive exertional angina versus musculoskeletal chest pain.   => Based on the intensity of her symptoms and the progression of disease we chose to proceed with CARDIAC CATHETERIZATION.   Recent Hospitalizations: For cardiac cath.  Reviewed  CV studies:    The following studies were reviewed today: (if available, images/films reviewed: From Epic Chart or Care Everywhere) Cardiac Catheterization 03/22/2022: Angiographically normal coronary arteries.  Normal LV gram and normal LVEDP.  Dominance: Left   Interval History:   Dana Burke returns today for post-cath follow-up with no post-cath complications.  She says that she has not had any further fullness in her chest but is still is profoundly dyspneic.  Maybe not to the extent that she was before, noting that she has felt little better being on Toprol.  No resting symptoms of chest tightness or pressure or dyspnea.  No PND, orthopnea with trivial edema.  No signs or symptoms to suggest spells of tachycardia.  No irregular or rapid heart rates.  She still has not been very active, really dating back to her prior surgery.  She is due for a chest CT on April 22 after which she may very well be following up with pulmonary medicine.  CV Review of Symptoms (Summary): positive for - dyspnea on exertion, edema, palpitations, and otherwise exercise tolerance and fatigue;  trivial end of day swelling.  Notably improved palpitations on Toprol.. negative for - chest pain, orthopnea, paroxysmal nocturnal dyspnea, rapid heart rate, shortness of breath, or syncope/near syncope or TIA/amaurosis fugax, claudication  REVIEWED OF SYSTEMS   Refer to HPI.  Other notable positive symptoms include joint pains-knees and hips.  Occasional positional dizziness.  I have reviewed and (if needed) personally updated the patient's problem list, medications, allergies, past medical and surgical history, social and family history.   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  Left dominant system.  Normal LVEF BiV gram and normal LVEDP.   THYROGLOSSAL DUCT CYST N/A 06/22/2015  Procedure: THYROGLOSSAL DUCT CYST;  Surgeon: Geanie Logan, MD;  Location: [facility] ORS;  Service: ENT;  Laterality: N/A;   TRANSTHORACIC ECHOCARDIOGRAM  08/2018   EF 60 to 65%.  GR 1 DD.  Normal RV size and function.  Mild LA dilation.  Normal valves.   MEDICATIONS/ALLERGIES   Current Meds  Medication Sig   acetaminophen (TYLENOL) 325 MG tablet Take by mouth.   albuterol (VENTOLIN HFA) 108 (90 Base) MCG/ACT inhaler Inhale into the  lungs every 6 (six) hours as needed for wheezing or shortness of breath.   aspirin EC 81 MG tablet Take 81 mg by mouth daily. Swallow whole.   atorvastatin (LIPITOR) 40 MG tablet Take 1 tablet (40 mg total) by mouth daily.   buPROPion (WELLBUTRIN XL) 300 MG 24 hr tablet Take 1 tablet (300 mg total) by mouth daily.   cetirizine (ZYRTEC) 10 MG tablet Take 10 mg by mouth at bedtime.    Cholecalciferol (VITAMIN D-3) 1000 UNITS CAPS Take 1 capsule by mouth daily.   Docusate Sodium (COLACE PO) Take 1 capsule by mouth daily.   escitalopram (LEXAPRO) 10 MG tablet TAKE 1 TABLET BY MOUTH ONCE DAILY   esomeprazole (NEXIUM) 40 MG capsule TAKE 1 CAPSULE BY MOUTH ONCE DAILY AT NOON   gabapentin (NEURONTIN) 300 MG capsule TAKE 1 CAPSULE BY MOUTH 4 TIMES DAILY   Magnesium Citrate 125 MG CAPS Take 2 capsules by mouth every evening.   Melatonin 10 MG TABS Take 1 tablet by mouth.   metoprolol succinate (TOPROL-XL) 25 MG 24 hr tablet Take 1 tablet (25 mg total) by mouth daily.   Multiple Vitamins-Minerals (MULTIVITAMIN WITH MINERALS) tablet Take 1 tablet by mouth daily.   nitroGLYCERIN (NITROSTAT) 0.4 MG SL tablet Place 1 tablet (0.4 mg total) SL:  every 5 (five) minutes as needed for chest pain.   No Known Allergies  SOCIAL HISTORY/FAMILY HISTORY   Reviewed in Epic:  Pertinent findings:  Social History   Tobacco Use   Smoking status: Former    Packs/day: 0.40    Years: 2.00    Additional pack years: 0.00    Total pack years: 0.80    Types: Cigarettes    Quit date: 07/30/1987    Years since quitting: 34.7   Smokeless tobacco: Never  Vaping Use   Vaping Use: Never used  Substance Use Topics   Alcohol use: No   Drug use: No   Social History   Social History Narrative   She is a retired Engineer, civil (consulting).   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 -- Now is helping her brother care for his wife - now on Hospice.    OBJCTIVE -PE, EKG, labs   Wt Readings from Last 3  Encounters:  04/25/22 244 lb 9.6 oz (110.9 kg)  03/22/22 239 lb (108.4 kg)  03/15/22 242 lb (109.8 kg)    Physical Exam: BP 118/74 (BP Location: Left Arm, Patient Position: Sitting, Cuff Size: Large)   Pulse 74   Ht 5\' 11"  (1.803 m)   Wt 244 lb 9.6 oz (110.9 kg)   SpO2 93%   BMI 34.11 kg/m  Physical Exam Vitals reviewed.  Constitutional:      General: She is not in acute distress.    Appearance: Normal appearance. She is obese. She is not ill-appearing (Healthy-appearing.  Well-nourished appointment.) or toxic-appearing.  HENT:     Head: Normocephalic and atraumatic.  Neck:     Vascular: No carotid bruit or JVD.  Cardiovascular:     Rate and Rhythm: Normal rate and regular rhythm. No extrasystoles are present.    Chest Wall: PMI is not displaced.     Pulses: Intact distal pulses.     Heart sounds: S1 normal and S2 normal. Heart sounds are distant. No murmur heard.    No friction rub. No gallop.  Pulmonary:     Effort: Pulmonary effort is normal. No respiratory distress.     Breath sounds: Normal breath sounds. No wheezing, rhonchi or rales.  Chest:     Chest wall: Tenderness (Notably improved from last visit) present.  Musculoskeletal:        General: No swelling.     Cervical back: Normal range of motion and neck supple.     Comments: Radial cath site clean dry intact.  No bruising.  Minimal scar  Skin:    General: Skin is warm and dry.  Neurological:     General: No focal deficit present.     Mental Status: She is alert and oriented to person, place, and time.  Psychiatric:        Mood and Affect: Mood normal.        Behavior: Behavior normal.        Thought Content: Thought content normal.        Judgment: Judgment normal.   == Adult ECG Report Not checked  Recent Labs: Reviewed Lab Results  Component Value Date   CHOL 142 03/09/2022   HDL 35.00 (L) 03/09/2022   LDLCALC 78 03/09/2022   LDLDIRECT 96.0 03/09/2022   TRIG 143.0 03/09/2022   CHOLHDL 4  03/09/2022   Lab Results  Component Value Date   CREATININE 0.88 03/15/2022   BUN 16 03/15/2022   NA 139 03/15/2022   K 4.5 03/15/2022   CL 104 03/15/2022   CO2 26 03/15/2022      Latest Ref Rng & Units 03/15/2022   10:42 AM 08/01/2021    9:53 AM 12/14/2020   11:10 AM  CBC  WBC 4.0 - 10.5 K/uL 6.4  5.6  5.3   Hemoglobin 12.0 - 15.0 g/dL 16.1  09.6  04.5   Hematocrit 36.0 - 46.0 % 42.0  38.3  42.8   Platelets 150 - 400 K/uL 210  261.0  218.0     Lab Results  Component Value Date   HGBA1C 7.2 (H) 03/09/2022   Lab Results  Component Value Date   TSH 1.19 12/14/2020    ================================================== I spent a total of 23 minutes with the patient spent in direct patient consultation.  Additional time spent with chart review  / charting (studies, outside notes, etc): 20 min Total Time: 43 min  Current medicines are reviewed at length with the patient today.  (+/- concerns) n/a  Notice: This dictation was prepared with Dragon dictation along with smart phrase technology. Any transcriptional errors that result from this process are unintentional and may not be corrected upon review.  Studies Ordered:   Orders Placed This Encounter  Procedures   Cardiopulmonary exercise test   Cardiac Stress Test: Informed Consent Details: Physician/Practitioner Attestation; Transcribe to consent form and obtain patient signature   ECHOCARDIOGRAM COMPLETE   No orders of the defined types were placed in this encounter.   Patient Instructions / Medication Changes & Studies & Tests Ordered   Patient Instructions  Medication Instructions:   No changes  *If you need a refill on your cardiac medications before your next appointment, please call your pharmacy*  Lab Work: Not needed  If you have labs (blood work) drawn today and your tests are completely normal, you will receive your results only by: MyChart Message (if you have MyChart) OR A paper copy in the  mail If you have any lab test that is abnormal or we need to change your treatment, we will call you to review the results.   Testing/Procedures:  Will be schedule  Your physician has requested that you have an echocardiogram. Echocardiography is a painless test that uses sound waves to create images of your heart. It provides your doctor with information about the size and shape of your heart and how well your heart's chambers and valves are working. This procedure takes approximately one hour. There are no restrictions for this procedure. Please do NOT wear cologne, perfume, aftershave, or lotions (deodorant is allowed). Please arrive 15 minutes prior to your appointment time.   And  Will be schedule at [facility] - Heart and vascular  Your physician has recommended that you have a cardiopulmonary stress test (CPX). CPX testing is a non-invasive measurement of heart and lung function. It replaces a traditional treadmill stress test. This type of test provides a tremendous amount of information that relates not only to your present condition but also for future outcomes. This test combines measurements of you ventilation, respiratory gas exchange in the lungs, electrocardiogram (EKG), blood pressure and physical response before, during, and following an exercise protocol.    Follow-Up: At [facility], you and your health needs are our priority.  As part of our continuing mission to provide you with exceptional heart care, we have created designated Provider Care Teams.  These Care Teams include your primary Cardiologist (physician) and Advanced Practice Providers (APPs -  Physician Assistants and Nurse Practitioners) who all work together to provide you with the care you need, when you need it.     Your next appointment:   2 month(s)  The format for your next appointment:   In Person  Provider:   Bryan Lemma, MD   Other Instructions      Marykay Lex, MD, MS Bryan Lemma, M.D., M.S. Interventional Cardiologist  [facility]  Pager # [phone] Phone # [phone] [address] [place], [place] [address]   Thank you for choosing [facility] [place]!!

## 2022-04-28 NOTE — Progress Notes (Incomplete)
Primary Care Provider: Sherlene Shams, MD [facility] Cardiologist: Bryan Lemma, MD Electrophysiologist: None  Clinic Note: No chief complaint on file. ===================================  ASSESSMENT/PLAN   Problem List Items Addressed This Visit       Cardiology Problems   Hyperlipidemia with target low density lipoprotein (LDL) cholesterol less than 100 mg/dL (Chronic)   Relevant Medications   aspirin EC 81 MG tablet     Other   Obesity (Chronic)   Multiple pulmonary nodules - Primary   DOE (dyspnea on exertion)   ===================================  HPI:    Dana Burke is a 77 y.o. female with a PMH below who presents today for post cath follow-up-evaluation DOE with associated Chest Tightness.  Dana Burke was seen on 03/15/2022 for evaluation of concerning symptoms of exertional dyspnea and chest tightness with minimal activity.  Increased frequency and intensity.  These episodes have been increasing in frequency over the previous month, but before that she has been fine.  Symptoms were always occurring with exertion not at rest.  She did have some chest wall tenderness on exam.  I was concerned for possible progressive exertional angina versus musculoskeletal chest pain.   => Based on the intensity of her symptoms and the progression of disease we chose to proceed with CARDIAC CATHETERIZATION.   Recent Hospitalizations: For cardiac cath.  Reviewed  CV studies:    The following studies were reviewed today: (if available, images/films reviewed: From Epic Chart or Care Everywhere) Cardiac Catheterization 03/22/2022: Angiographically normal coronary arteries.  Normal LV gram and normal LVEDP.  Dominance: Left   Interval History:   Dana Burke returns today for post-cath follow-up with no post-cath complications.  She says that she has not had any further fullness in her chest but is still is profoundly dyspneic.  Maybe not to the  extent that she was before, noting that she has felt little better being on Toprol.  No resting symptoms of chest tightness or pressure or dyspnea.  No PND, orthopnea with trivial edema.  No signs or symptoms to suggest spells of tachycardia.  No irregular or rapid heart rates.  She still has not been very active, really dating back to her prior surgery.  She is due for a chest CT on April 22 after which she may very well be following up with pulmonary medicine.  CV Review of Symptoms (Summary): positive for - dyspnea on exertion, edema, palpitations, and otherwise exercise tolerance and fatigue; trivial end of day swelling.  Notably improved palpitations on Toprol.. negative for - chest pain, orthopnea, paroxysmal nocturnal dyspnea, rapid heart rate, shortness of breath, or syncope/near syncope or TIA/amaurosis fugax, claudication  REVIEWED OF SYSTEMS   Refer to HPI.  Other notable positive symptoms include joint pains-knees and hips.  Occasional positional dizziness.  I have reviewed and (if needed) personally updated the patient's problem list, medications, allergies, past medical and surgical history, social and family history.   PAST MEDICAL HISTORY   Past Medical History:  Diagnosis Date  . Actinic keratosis   . Breast cancer   . Depression    recent events  . Dysphonia   . GERD (gastroesophageal reflux disease)   . Hyperlipidemia   . Otitis media with rupture of tympanic membrane, left 02/13/2018  . Phlebitis and thrombophlebitis of the leg   . Prediabetes   . Pulmonary embolism   . 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 APPENDECTOMY  1963  . BREAST BIOPSY Left 11/10/2015   benign  . CHOLECYSTECTOMY  1996  . FRACTURE SURGERY  2001   left tibia  . LEFT HEART CATH AND CORONARY ANGIOGRAPHY Left 03/22/2022   Procedure: LEFT HEART CATH AND CORONARY ANGIOGRAPHY;  Surgeon: Marykay Lex, MD;  Location:  [facility] INVASIVE CV LAB;  Service: Cardiovascular;  Laterality: Left;  . THYROGLOSSAL DUCT CYST N/A 06/22/2015   Procedure: THYROGLOSSAL DUCT CYST;  Surgeon: Geanie Logan, MD;  Location: [facility] ORS;  Service: ENT;  Laterality: N/A;  . TRANSTHORACIC ECHOCARDIOGRAM  08/2018   EF 60 to 65%.  GR 1 DD.  Normal RV size and function.  Mild LA dilation.  Normal valves.   MEDICATIONS/ALLERGIES   Current Meds  Medication Sig  . acetaminophen (TYLENOL) 325 MG tablet Take by mouth.  [name] albuterol (VENTOLIN HFA) 108 (90 Base) MCG/ACT inhaler Inhale into the lungs every 6 (six) hours as needed for wheezing or shortness of breath.  [name] aspirin EC 81 MG tablet Take 81 mg by mouth daily. Swallow whole.  [name] atorvastatin (LIPITOR) 40 MG tablet Take 1 tablet (40 mg total) by mouth daily.  [name] buPROPion (WELLBUTRIN XL) 300 MG 24 hr tablet Take 1 tablet (300 mg total) by mouth daily.  . cetirizine (ZYRTEC) 10 MG tablet Take 10 mg by mouth at bedtime.   . Cholecalciferol (VITAMIN D-3) 1000 UNITS CAPS Take 1 capsule by mouth daily.  Tery Sanfilippo Sodium (COLACE PO) Take 1 capsule by mouth daily.  [name] escitalopram (LEXAPRO) 10 MG tablet TAKE 1 TABLET BY MOUTH ONCE DAILY  . esomeprazole (NEXIUM) 40 MG capsule TAKE 1 CAPSULE BY MOUTH ONCE DAILY AT NOON  . gabapentin (NEURONTIN) 300 MG capsule TAKE 1 CAPSULE BY MOUTH 4 TIMES DAILY  . Magnesium Citrate 125 MG CAPS Take 2 capsules by mouth every evening.  . Melatonin 10 MG TABS Take 1 tablet by mouth.  . metoprolol succinate (TOPROL-XL) 25 MG 24 hr tablet Take 1 tablet (25 mg total) by mouth daily.  . Multiple Vitamins-Minerals (MULTIVITAMIN WITH MINERALS) tablet Take 1 tablet by mouth daily.  . nitroGLYCERIN (NITROSTAT) 0.4 MG SL tablet Place 1 tablet (0.4 mg total) SL:  every 5 (five) minutes as needed for chest pain.   No Known Allergies  SOCIAL HISTORY/FAMILY HISTORY   Reviewed in Epic:  Pertinent findings:  Social History   Tobacco Use  . Smoking status: Former     Packs/day: 0.40    Years: 2.00    Additional pack years: 0.00    Total pack years: 0.80    Types: Cigarettes    Quit date: 07/30/1987    Years since quitting: 34.7  . Smokeless tobacco: Never  Vaping Use  . Vaping Use: Never used  Substance Use Topics  . Alcohol use: No  . Drug use: No   Social History   Social History Narrative   She is a retired Engineer, civil (consulting).   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 -- Now is helping her brother care for his wife - now on Hospice.    OBJCTIVE -PE, EKG, labs   Wt Readings from Last 3 Encounters:  04/25/22 244 lb 9.6 oz (110.9 kg)  03/22/22 239 lb (108.4 kg)  03/15/22 242 lb (109.8 kg)    Physical Exam: BP 118/74 (BP Location: Left Arm, Patient Position: Sitting, Cuff Size: Large)   Pulse 74   Ht 5\' 11"  (1.803 m)  Wt 244 lb 9.6 oz (110.9 kg)   SpO2 93%   BMI 34.11 kg/m  Physical Exam Vitals reviewed.  Constitutional:      General: She is not in acute distress.    Appearance: Normal appearance. She is obese. She is not ill-appearing (Healthy-appearing.  Well-nourished appointment.) or toxic-appearing.  HENT:     Head: Normocephalic and atraumatic.  Neck:     Vascular: No carotid bruit or JVD.  Cardiovascular:     Rate and Rhythm: Normal rate and regular rhythm. No extrasystoles are present.    Chest Wall: PMI is not displaced.     Pulses: Intact distal pulses.     Heart sounds: S1 normal and S2 normal. Heart sounds are distant. No murmur heard.    No friction rub. No gallop.  Pulmonary:     Effort: Pulmonary effort is normal. No respiratory distress.     Breath sounds: Normal breath sounds. No wheezing, rhonchi or rales.  Chest:     Chest wall: Tenderness (Notably improved from last visit) present.  Musculoskeletal:        General: No swelling.     Cervical back: Normal range of motion and neck supple.     Comments: Radial cath site clean dry intact.  No bruising.  Minimal scar  Skin:     General: Skin is warm and dry.  Neurological:     General: No focal deficit present.     Mental Status: She is alert and oriented to person, place, and time.  Psychiatric:        Mood and Affect: Mood normal.        Behavior: Behavior normal.        Thought Content: Thought content normal.        Judgment: Judgment normal.   == Adult ECG Report Not checked  Recent Labs: Reviewed Lab Results  Component Value Date   CHOL 142 03/09/2022   HDL 35.00 (L) 03/09/2022   LDLCALC 78 03/09/2022   LDLDIRECT 96.0 03/09/2022   TRIG 143.0 03/09/2022   CHOLHDL 4 03/09/2022   Lab Results  Component Value Date   CREATININE 0.88 03/15/2022   BUN 16 03/15/2022   NA 139 03/15/2022   K 4.5 03/15/2022   CL 104 03/15/2022   CO2 26 03/15/2022      Latest Ref Rng & Units 03/15/2022   10:42 AM 08/01/2021    9:53 AM 12/14/2020   11:10 AM  CBC  WBC 4.0 - 10.5 K/uL 6.4  5.6  5.3   Hemoglobin 12.0 - 15.0 g/dL 02.6  37.8  58.8   Hematocrit 36.0 - 46.0 % 42.0  38.3  42.8   Platelets 150 - 400 K/uL 210  261.0  218.0     Lab Results  Component Value Date   HGBA1C 7.2 (H) 03/09/2022   Lab Results  Component Value Date   TSH 1.19 12/14/2020    ================================================== I spent a total of 23 minutes with the patient spent in direct patient consultation.  Additional time spent with chart review  / charting (studies, outside notes, etc): +3 *** min Total Time: *** min  Current medicines are reviewed at length with the patient today.  (+/- concerns) ***  Notice: This dictation was prepared with Dragon dictation along with smart phrase technology. Any transcriptional errors that result from this process are unintentional and may not be corrected upon review.  Studies Ordered:   No orders of the defined types were placed in this encounter.  No orders of the defined types were placed in this encounter.   Patient Instructions / Medication Changes & Studies & Tests  Ordered   There are no Patient Instructions on file for this visit.     Marykay Lex, MD, MS Bryan Lemma, M.D., M.S. Interventional Cardiologist  [facility]  Pager # [phone] Phone # [phone] [address] [place], [place] [address]   Thank you for choosing [facility] [place]!!

## 2022-04-29 ENCOUNTER — Encounter: Payer: Self-pay | Admitting: Cardiology

## 2022-04-29 NOTE — Assessment & Plan Note (Signed)
Interestingly, her symptoms are very concerning for possible angina with exertional dyspnea and chest discomfort.  However her cardiac catheterization revealed relatively normal coronary arteries.  I do not see any evidence of spasm.    Likely noncardiac etiology although we have still not consider microvascular disease.  Plan: Assess for microvascular disease with CPX (Cardiopulmonary Stress Stress Test) and 2D Echo.  The results will be taken into context with her upcoming chest CT

## 2022-04-29 NOTE — Assessment & Plan Note (Signed)
BMI of 34.  Is possible that her exertional dyspnea simply related to obesity and deconditioning.  Exclude cardiac/pulmonary etiology With CPX

## 2022-04-29 NOTE — Assessment & Plan Note (Signed)
Previous CT scan showed coronary artery calcification and atherosclerotic aortic disease.  Also evidence of scattered pulmonary parenchymal scarring and multiple nodules.  Has upcoming CT scan ordered on April 22. Echo in the past was normal.  Cardiac catheterization showed normal coronary arteries.  Plan: Further cardiac evaluation with 2D Echo (has been normal 4 years since last study), with normal coronaries, we are safe to proceed with Cardiopulmonary Exercise Stress Testing (CPX).   Shared Decision Making/Informed Consent The risks [chest pain, shortness of breath, cardiac arrhythmias, dizziness, blood pressure fluctuations, myocardial infarction, stroke/transient ischemic attack, and life-threatening complications (estimated to be 1 in 10,000)], benefits (risk stratification, diagnosing coronary artery disease, treatment guidance) and alternatives of an exercise tolerance test were discussed in detail with Ms. Cassata and she agrees to proceed.

## 2022-04-29 NOTE — Assessment & Plan Note (Signed)
Underlying lung disease.  Has chest CT upcoming.  Quite possible that her dyspnea is related to pulmonary disease.  Evaluate with 2D Echo and CPX along with upcoming CT of the chest ordered by PCP/pulmonary

## 2022-04-29 NOTE — Assessment & Plan Note (Signed)
Pretty normal coronary arteries.  As long as we can exclude microvascular disease, her LDL of 78 is acceptable.  Continue current dose of atorvastatin 40 mg daily.

## 2022-05-04 ENCOUNTER — Ambulatory Visit: Payer: Medicare Other

## 2022-05-07 ENCOUNTER — Ambulatory Visit
Admission: RE | Admit: 2022-05-07 | Discharge: 2022-05-07 | Disposition: A | Discharge status: Home / Self Care | Payer: Medicare Other | Source: Ambulatory Visit | Attending: Pulmonary Disease | Admitting: Pulmonary Disease

## 2022-05-07 DIAGNOSIS — R918 Other nonspecific abnormal finding of lung field: Secondary | ICD-10-CM | POA: Diagnosis not present

## 2022-05-07 DIAGNOSIS — J439 Emphysema, unspecified: Secondary | ICD-10-CM | POA: Diagnosis not present

## 2022-05-11 ENCOUNTER — Telehealth: Payer: Self-pay | Admitting: Pulmonary Disease

## 2022-05-11 DIAGNOSIS — R918 Other nonspecific abnormal finding of lung field: Secondary | ICD-10-CM

## 2022-05-11 NOTE — Telephone Encounter (Signed)
I spoke with the patient and notified her of her results. I have placed the order for the CT to be done in 1 year.  Nothing further needed.

## 2022-05-11 NOTE — Telephone Encounter (Signed)
Pt. Calling to go over CT she doen't have access to my chart but made her awareDr. Jayme Cloud ordering another CT to be done in 1 year time and she was fine with that but still wants results to go over

## 2022-05-23 ENCOUNTER — Other Ambulatory Visit: Payer: Self-pay | Admitting: Cardiology

## 2022-05-23 DIAGNOSIS — R0609 Other forms of dyspnea: Secondary | ICD-10-CM

## 2022-05-23 DIAGNOSIS — I2510 Atherosclerotic heart disease of native coronary artery without angina pectoris: Secondary | ICD-10-CM

## 2022-05-23 DIAGNOSIS — R918 Other nonspecific abnormal finding of lung field: Secondary | ICD-10-CM

## 2022-05-23 DIAGNOSIS — E6609 Other obesity due to excess calories: Secondary | ICD-10-CM

## 2022-05-23 DIAGNOSIS — E785 Hyperlipidemia, unspecified: Secondary | ICD-10-CM

## 2022-05-24 ENCOUNTER — Other Ambulatory Visit: Payer: Medicare Other

## 2022-05-29 ENCOUNTER — Encounter (HOSPITAL_COMMUNITY): Payer: Medicare Other

## 2022-06-04 ENCOUNTER — Other Ambulatory Visit: Payer: Self-pay | Admitting: Internal Medicine

## 2022-06-08 ENCOUNTER — Ambulatory Visit
Admission: RE | Admit: 2022-06-08 | Discharge: 2022-06-08 | Disposition: A | Discharge status: Home / Self Care | Payer: Medicare Other | Source: Ambulatory Visit | Attending: Internal Medicine | Admitting: Internal Medicine

## 2022-06-08 DIAGNOSIS — Z1231 Encounter for screening mammogram for malignant neoplasm of breast: Secondary | ICD-10-CM | POA: Diagnosis not present

## 2022-06-28 ENCOUNTER — Ambulatory Visit: Payer: Medicare Other | Attending: Cardiology

## 2022-06-28 DIAGNOSIS — R0609 Other forms of dyspnea: Secondary | ICD-10-CM | POA: Diagnosis not present

## 2022-06-28 LAB — ECHOCARDIOGRAM COMPLETE
AR max vel: 2.9 cm2
AV Area VTI: 3.58 cm2
AV Area mean vel: 2.9 cm2
AV Mean grad: 3 mmHg
AV Peak grad: 5.3 mmHg
Ao pk vel: 1.15 m/s
Area-P 1/2: 4.06 cm2
S' Lateral: 3.7 cm

## 2022-06-28 MED ORDER — PERFLUTREN LIPID MICROSPHERE
1.0000 mL | INTRAVENOUS | Status: AC | PRN
  Administered 2022-06-28: 2 mL via INTRAVENOUS

## 2022-07-02 ENCOUNTER — Encounter (HOSPITAL_COMMUNITY): Payer: Medicare Other

## 2022-07-05 ENCOUNTER — Ambulatory Visit: Payer: Medicare Other | Admitting: Cardiology

## 2022-07-06 ENCOUNTER — Ambulatory Visit: Payer: Medicare Other

## 2022-07-17 ENCOUNTER — Ambulatory Visit (HOSPITAL_COMMUNITY): Payer: Medicare Other | Attending: Cardiology

## 2022-07-17 DIAGNOSIS — R0609 Other forms of dyspnea: Secondary | ICD-10-CM | POA: Insufficient documentation

## 2022-07-17 DIAGNOSIS — E785 Hyperlipidemia, unspecified: Secondary | ICD-10-CM | POA: Insufficient documentation

## 2022-07-17 DIAGNOSIS — F32A Depression, unspecified: Secondary | ICD-10-CM | POA: Diagnosis not present

## 2022-07-17 DIAGNOSIS — K219 Gastro-esophageal reflux disease without esophagitis: Secondary | ICD-10-CM | POA: Insufficient documentation

## 2022-07-17 DIAGNOSIS — Z87891 Personal history of nicotine dependence: Secondary | ICD-10-CM | POA: Diagnosis not present

## 2022-07-17 DIAGNOSIS — R918 Other nonspecific abnormal finding of lung field: Secondary | ICD-10-CM | POA: Diagnosis not present

## 2022-07-17 DIAGNOSIS — R7303 Prediabetes: Secondary | ICD-10-CM | POA: Diagnosis not present

## 2022-07-17 DIAGNOSIS — Z86711 Personal history of pulmonary embolism: Secondary | ICD-10-CM | POA: Diagnosis not present

## 2022-07-17 DIAGNOSIS — E6609 Other obesity due to excess calories: Secondary | ICD-10-CM | POA: Diagnosis not present

## 2022-07-17 DIAGNOSIS — R0600 Dyspnea, unspecified: Secondary | ICD-10-CM | POA: Diagnosis not present

## 2022-07-23 NOTE — Progress Notes (Unsigned)
Cardiology Office Note:    Date:  07/24/2022   ID:  Ziyan Borga, DOB 12-08-1945, MRN [id number]  PCP:  Sherlene Shams, MD  [facility] Cardiologist:  Bryan Lemma, MD  [facility] Electrophysiologist:  None   Referring MD: Sherlene Shams, MD   Chief Complaint: F/u echo and CPX  History of Present Illness:    Abimbola Cables is a 77 y.o. female with a hx of HLD, CAD, obesity, pulmonary nodules, DOE who presents for 3 month follow-up.  For DOE, patient underwent Cardiac cath in 03/2022 showing normal coronary arteries. Echo showed LVEF 60-65%, no WMA, G1DD, mild MR. Cardiopulmonary stress test 07/2022 was normal functional capacity with no indication for cardiopulmonary limitation. Patient's body habitus appears limiting factor.   Today, the patient reports she has been feeling better on the cardiac medications. She reports no further chest fullness. She has occasional shortness of breath. Breathing is worse when she is doing a task and when she walks. No lower leg edema. No orthopnea or pnd. Rare lightheadedness and dizziness.   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  Left dominant system.  Normal LVEF BiV gram and normal LVEDP.    THYROGLOSSAL DUCT CYST N/A 06/22/2015   Procedure: THYROGLOSSAL DUCT CYST;  Surgeon: Geanie Logan, MD;  Location: [facility] ORS;  Service: ENT;  Laterality: N/A;   TRANSTHORACIC ECHOCARDIOGRAM  08/2018   EF 60 to 65%.  GR 1 DD.  Normal RV size and function.  Mild LA dilation.  Normal valves.    Current Medications: Current Meds  Medication Sig   acetaminophen (TYLENOL) 325 MG tablet Take by mouth.   albuterol (VENTOLIN HFA) 108 (90 Base) MCG/ACT inhaler Inhale into the lungs every 6 (six) hours as needed for wheezing or shortness of breath.   aspirin EC 81 MG tablet Take 81 mg by mouth daily. Swallow whole.   atorvastatin (LIPITOR) 40 MG tablet Take 1 tablet (40 mg total) by mouth daily.   buPROPion (WELLBUTRIN XL) 300 MG 24 hr tablet Take 1 tablet (300 mg total) by mouth daily.   cetirizine (ZYRTEC) 10 MG tablet Take 10 mg by mouth at bedtime.    Cholecalciferol (VITAMIN D-3) 1000 UNITS CAPS Take 1 capsule by mouth daily.   Docusate Sodium (COLACE PO) Take 1 capsule by mouth daily.   escitalopram (LEXAPRO) 10 MG tablet TAKE 1 TABLET BY MOUTH ONCE DAILY   esomeprazole (NEXIUM) 40 MG capsule TAKE 1 CAPSULE BY MOUTH ONCE DAILY AT 12NOON   gabapentin (NEURONTIN) 300 MG capsule TAKE 1 CAPSULE BY MOUTH 4 TIMES DAILY   Magnesium Citrate 125 MG CAPS Take 2 capsules by mouth  every evening.   Melatonin 10 MG TABS Take 1 tablet by mouth.   metoprolol succinate (TOPROL-XL) 25 MG 24 hr tablet Take 1 tablet (25 mg total) by mouth daily.   Multiple Vitamins-Minerals (MULTIVITAMIN WITH MINERALS) tablet Take 1 tablet by mouth daily.   nitroGLYCERIN (NITROSTAT) 0.4 MG SL tablet Place 1 tablet (0.4 mg total) under the tongue every 5 (five) minutes as needed for chest pain.     Allergies:   Patient has no known allergies.   Social History   Socioeconomic History   Marital status: Widowed    Spouse name: Not on file   Number of children: Not on file   Years of education: Not on file   Highest education  level: Not on file  Occupational History   Not on file  Tobacco Use   Smoking status: Former    Packs/day: 0.40    Years: 2.00    Additional pack years: 0.00    Total pack years: 0.80    Types: Cigarettes    Quit date: 07/30/1987    Years since quitting: 35.0   Smokeless tobacco: Never  Vaping Use   Vaping Use: Never used  Substance and Sexual Activity   Alcohol use: No   Drug use: No   Sexual activity: Never  Other Topics Concern   Not on file  Social History Narrative   She is a retired Engineer, civil (consulting).   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 -- Now is helping her brother care for his wife - now on Hospice.   Social Determinants of Health   Financial Resource Strain: Low Risk  (01/29/2019)   Overall Financial Resource Strain (CARDIA)    Difficulty of Paying Living Expenses: Not hard at all  Food Insecurity: No Food Insecurity (07/04/2021)   Hunger Vital Sign    Worried About Running Out of Food in the Last Year: Never true    Ran Out of Food in the Last Year: Never true  Transportation Needs: No Transportation Needs (01/29/2019)   PRAPARE - Administrator, Civil Service (Medical): No    Lack of Transportation (Non-Medical): No  Physical Activity: Unknown (04/23/2017)   Exercise Vital Sign    Days of Exercise per Week: 0 days    Minutes of Exercise per Session: Not on file  Stress: No Stress Concern Present (01/29/2019)   [employer] of Occupational Health - Occupational Stress Questionnaire    Feeling of Stress : Not at all  Social Connections: Unknown (01/29/2019)   Social Connection and Isolation Panel [NHANES]    Frequency of Communication with Friends and Family: More than three times a week    Frequency of Social Gatherings with Friends and Family: More than three times a week    Attends Religious Services: 1 to 4 times per year    Active Member of [employer] or Organizations: Yes    Attends Engineer, structural: Not  on file    Marital Status: Not on file     Family History: The patient's family history includes Bladder Cancer in her brother; Breast cancer in her cousin, cousin, cousin, cousin, cousin, and sister; Breast cancer (age of onset: 41) in her niece; Cancer in her brother, paternal uncle, sister, and sister; Congestive Heart Failure in her father; Coronary artery disease (age of onset: 61) in her brother; Dementia in her mother; Diabetes in her brother, brother, brother, and father; Heart disease in her father and mother;  Hyperlipidemia in her father and mother; Hypertension in her brother, brother, father, and paternal grandmother; Seizures in her grandchild.  ROS:   Please see the history of present illness.     All other systems reviewed and are negative.  EKGs/Labs/Other Studies Reviewed:    The following studies were reviewed today:  CPX 07/2022 Conclusion: Exercise testing with gas exchange demonstrates a normal functional capacity when compared to matched sedentary norms. There is no indication for cardiopulmonary limitation. Patient's body habitus appears primary limiting factor with 24% improvement in PVO2 with corrections for ideal body weight.   Echo 06/2022 1. Left ventricular ejection fraction, by estimation, is 60 to 65%. The  left ventricle has normal function. The left ventricle has no regional  wall motion abnormalities. Left ventricular diastolic parameters are  consistent with Grade I diastolic  dysfunction (impaired relaxation).   2. Right ventricular systolic function is normal. The right ventricular  size is normal. There is normal pulmonary artery systolic pressure. The  estimated right ventricular systolic pressure is 32.2 mmHg.   3. The mitral valve is normal in structure. Mild mitral valve  regurgitation. No evidence of mitral stenosis.   4. The aortic valve is normal in structure. Aortic valve regurgitation is  not visualized. No aortic stenosis is present.   5.  The inferior vena cava is normal in size with greater than 50%  respiratory variability, suggesting right atrial pressure of 3 mmHg.   Cardiac cath 03/2022 [facility] DIAGNOSES Angiographically normal coronary arteries-left dominant system. Normal LV gram and normal LV EDP.      RECOMMENDATIONS Consider an nonanginal etiology for chest pain and dyspnea.       Bryan Lemma, MD   EKG:  EKG is not ordered today.   Recent Labs: 03/09/2022: ALT 21 03/14/2022: Pro B Natriuretic peptide (BNP) 4.0 03/15/2022: BUN 16; Creatinine, Ser 0.88; Hemoglobin 13.8; Platelets 210; Potassium 4.5; Sodium 139  Recent Lipid Panel    Component Value Date/Time   CHOL 142 03/09/2022 1009   TRIG 143.0 03/09/2022 1009   HDL 35.00 (L) 03/09/2022 1009   CHOLHDL 4 03/09/2022 1009   VLDL 28.6 03/09/2022 1009   LDLCALC 78 03/09/2022 1009   LDLCALC 72 06/16/2021 0837   LDLDIRECT 96.0 03/09/2022 1009     Physical Exam:    VS:  BP 102/66 (BP Location: Left Arm, Patient Position: Sitting, Cuff Size: Large)   Pulse 73   Ht 5\' 11"  (1.803 m)   Wt 249 lb (112.9 kg)   SpO2 95%   BMI 34.73 kg/m     Wt Readings from Last 3 Encounters:  07/24/22 249 lb (112.9 kg)  04/25/22 244 lb 9.6 oz (110.9 kg)  03/22/22 239 lb (108.4 kg)     GEN:  Well nourished, well developed in no acute distress HEENT: Normal NECK: No JVD; No carotid bruits LYMPHATICS: No lymphadenopathy CARDIAC: RRR, no murmurs, rubs, gallops RESPIRATORY:  Clear to auscultation without rales, wheezing or rhonchi  ABDOMEN: Soft, non-tender, non-distended MUSCULOSKELETAL:  No edema; No deformity  SKIN: Warm and dry NEUROLOGIC:  Alert and oriented x 3 PSYCHIATRIC:  Normal affect   ASSESSMENT:    1. DOE (dyspnea on exertion)   2. Coronary artery disease, non-occlusive   3. Hyperlipidemia, mixed   4. Multiple pulmonary nodules    PLAN:    In order of problems listed above:  DOE [facility] 03/2022 showed normal coronaries. Echo showed normal  LVEF, G1DD, mild MR. CPX showed normal functional capacity when compared to  matched sedentary norms. There is no indication for cardiopulmonary limitation, body habitus is limiting factor. Patient reports breathing is overall better. No further work-up at this time.   Nonobstructive CAD LHC showed normal coronaries. No further chest fullness. Continue Aspirin 81mg  daily, Lipitor 40mg  daily, Toprol 25mg  daily, SL NTG.  HLD LDL 96. Continue Lipitor 71m daily  Pulmonary Nodules Patient is following with pulmonology.   Disposition: Follow up in 3 month(s) with MD/APP    Signed, Ruberta Holck David Stall, PA-C  07/24/2022 9:58 AM    [facility]

## 2022-07-24 ENCOUNTER — Ambulatory Visit: Payer: Medicare Other | Attending: Medical | Admitting: Medical

## 2022-07-24 ENCOUNTER — Encounter: Payer: Self-pay | Admitting: Medical

## 2022-07-24 VITALS — BP 102/66 | HR 73 | Ht 71.0 in | Wt 249.0 lb

## 2022-07-24 DIAGNOSIS — I2510 Atherosclerotic heart disease of native coronary artery without angina pectoris: Secondary | ICD-10-CM | POA: Diagnosis not present

## 2022-07-24 DIAGNOSIS — E782 Mixed hyperlipidemia: Secondary | ICD-10-CM

## 2022-07-24 DIAGNOSIS — R918 Other nonspecific abnormal finding of lung field: Secondary | ICD-10-CM

## 2022-07-24 DIAGNOSIS — R0609 Other forms of dyspnea: Secondary | ICD-10-CM

## 2022-07-24 NOTE — Patient Instructions (Signed)
Medication Instructions:   Your physician recommends that you continue on your current medications as directed. Please refer to the Current Medication list given to you today.  *If you need a refill on your cardiac medications before your next appointment, please call your pharmacy*   Lab Work:  None ordered today.  If you have labs (blood work) drawn today and your tests are completely normal, you will receive your results only by: MyChart Message (if you have MyChart) OR A paper copy in the mail If you have any lab test that is abnormal or we need to change your treatment, we will call you to review the results.   Testing/Procedures:  None ordered today.   Follow-Up: At [facility], you and your health needs are our priority.  As part of our continuing mission to provide you with exceptional heart care, we have created designated Provider Care Teams.  These Care Teams include your primary Cardiologist (physician) and Advanced Practice Providers (APPs -  Physician Assistants and Nurse Practitioners) who all work together to provide you with the care you need, when you need it.  We recommend signing up for the patient portal called "MyChart".  Sign up information is provided on this After Visit Summary.  MyChart is used to connect with patients for Virtual Visits (Telemedicine).  Patients are able to view lab/test results, encounter notes, upcoming appointments, etc.  Non-urgent messages can be sent to your provider as well.   To learn more about what you can do with MyChart, go to [email or web address].    Your next appointment:   3 month(s)  Provider:   You may see Bryan Lemma, MD or one of the following Advanced Practice Providers on your designated Care Team:   Nicolasa Ducking, NP Eula Listen, PA-C Cadence Fransico Michael, PA-C Charlsie Quest, NP

## 2022-08-06 ENCOUNTER — Ambulatory Visit (INDEPENDENT_AMBULATORY_CARE_PROVIDER_SITE_OTHER): Payer: Medicare Other | Admitting: *Deleted

## 2022-08-06 VITALS — Ht 71.0 in | Wt 245.0 lb

## 2022-08-06 DIAGNOSIS — Z0000 Encounter for general adult medical examination without abnormal findings: Secondary | ICD-10-CM | POA: Diagnosis not present

## 2022-08-06 NOTE — Patient Instructions (Signed)
Per patient no change in vitals since last visit, unable to obtain new vitals due to telehealth visit  Ms. Dana Burke , Thank you for taking time to come for your Medicare Wellness Visit. I appreciate your ongoing commitment to your health goals. Please review the following plan we discussed and let me know if I can assist you in the future.   These are the goals we discussed:  Goals      Increase physical activity     Patient Stated     Wants to start back walking        This is a list of the screening recommended for you and due dates:  Health Maintenance  Topic Date Due   Complete foot exam   Never done   Eye exam for diabetics  Never done   Zoster (Shingles) Vaccine (1 of 2) 04/05/1964   COVID-19 Vaccine (6 - 2023-24 season) 09/15/2021   Yearly kidney health urinalysis for diabetes  12/14/2021   Flu Shot  08/16/2022   Hemoglobin A1C  09/07/2022   Yearly kidney function blood test for diabetes  03/15/2023   DTaP/Tdap/Td vaccine (5 - Td or Tdap) 05/02/2023   Mammogram  06/08/2023   Medicare Annual Wellness Visit  08/06/2023   Pneumonia Vaccine  Completed   DEXA scan (bone density measurement)  Completed   Hepatitis C Screening  Completed   HPV Vaccine  Aged Out   Colon Cancer Screening  Discontinued    Advanced directives:Will bring copy to office  Conditions/risks identified: None  Next appointment: Follow up in one year for your annual wellness visit 08/07/23 @ 10:45   Preventive Care 65 Years and Older, Female Preventive care refers to lifestyle choices and visits with your health care provider that can promote health and wellness. What does preventive care include? A yearly physical exam. This is also called an annual well check. Dental exams once or twice a year. Routine eye exams. Ask your health care provider how often you should have your eyes checked. Personal lifestyle choices, including: Daily care of your teeth and gums. Regular physical activity. Eating a  healthy diet. Avoiding tobacco and drug use. Limiting alcohol use. Practicing safe sex. Taking low-dose aspirin every day. Taking vitamin and mineral supplements as recommended by your health care provider. What happens during an annual well check? The services and screenings done by your health care provider during your annual well check will depend on your age, overall health, lifestyle risk factors, and family history of disease. Counseling  Your health care provider may ask you questions about your: Alcohol use. Tobacco use. Drug use. Emotional well-being. Home and relationship well-being. Sexual activity. Eating habits. History of falls. Memory and ability to understand (cognition). Work and work Astronomer. Reproductive health. Screening  You may have the following tests or measurements: Height, weight, and BMI. Blood pressure. Lipid and cholesterol levels. These may be checked every 5 years, or more frequently if you are over 21 years old. Skin check. Lung cancer screening. You may have this screening every year starting at age 26 if you have a 30-pack-year history of smoking and currently smoke or have quit within the past 15 years. Fecal occult blood test (FOBT) of the stool. You may have this test every year starting at age 33. Flexible sigmoidoscopy or colonoscopy. You may have a sigmoidoscopy every 5 years or a colonoscopy every 10 years starting at age 76. Hepatitis C blood test. Hepatitis B blood test. Sexually transmitted disease (STD) testing.  Diabetes screening. This is done by checking your blood sugar (glucose) after you have not eaten for a while (fasting). You may have this done every 1-3 years. Bone density scan. This is done to screen for osteoporosis. You may have this done starting at age 13. Mammogram. This may be done every 1-2 years. Talk to your health care provider about how often you should have regular mammograms. Talk with your health care  provider about your test results, treatment options, and if necessary, the need for more tests. Vaccines  Your health care provider may recommend certain vaccines, such as: Influenza vaccine. This is recommended every year. Tetanus, diphtheria, and acellular pertussis (Tdap, Td) vaccine. You may need a Td booster every 10 years. Zoster vaccine. You may need this after age 72. Pneumococcal 13-valent conjugate (PCV13) vaccine. One dose is recommended after age 53. Pneumococcal polysaccharide (PPSV23) vaccine. One dose is recommended after age 35. Talk to your health care provider about which screenings and vaccines you need and how often you need them. This information is not intended to replace advice given to you by your health care provider. Make sure you discuss any questions you have with your health care provider. Document Released: 01/28/2015 Document Revised: 09/21/2015 Document Reviewed: 11/02/2014 Elsevier Interactive Patient Education  2017 [employer].  Fall Prevention in the Home Falls can cause injuries. They can happen to people of all ages. There are many things you can do to make your home safe and to help prevent falls. What can I do on the outside of my home? Regularly fix the edges of walkways and driveways and fix any cracks. Remove anything that might make you trip as you walk through a door, such as a raised step or threshold. Trim any bushes or trees on the path to your home. Use bright outdoor lighting. Clear any walking paths of anything that might make someone trip, such as rocks or tools. Regularly check to see if handrails are loose or broken. Make sure that both sides of any steps have handrails. Any raised decks and porches should have guardrails on the edges. Have any leaves, snow, or ice cleared regularly. Use sand or salt on walking paths during winter. Clean up any spills in your garage right away. This includes oil or grease spills. What can I do in the  bathroom? Use night lights. Install grab bars by the toilet and in the tub and shower. Do not use towel bars as grab bars. Use non-skid mats or decals in the tub or shower. If you need to sit down in the shower, use a plastic, non-slip stool. Keep the floor dry. Clean up any water that spills on the floor as soon as it happens. Remove soap buildup in the tub or shower regularly. Attach bath mats securely with double-sided non-slip rug tape. Do not have throw rugs and other things on the floor that can make you trip. What can I do in the bedroom? Use night lights. Make sure that you have a light by your bed that is easy to reach. Do not use any sheets or blankets that are too big for your bed. They should not hang down onto the floor. Have a firm chair that has side arms. You can use this for support while you get dressed. Do not have throw rugs and other things on the floor that can make you trip. What can I do in the kitchen? Clean up any spills right away. Avoid walking on wet floors.  Keep items that you use a lot in easy-to-reach places. If you need to reach something above you, use a strong step stool that has a grab bar. Keep electrical cords out of the way. Do not use floor polish or wax that makes floors slippery. If you must use wax, use non-skid floor wax. Do not have throw rugs and other things on the floor that can make you trip. What can I do with my stairs? Do not leave any items on the stairs. Make sure that there are handrails on both sides of the stairs and use them. Fix handrails that are broken or loose. Make sure that handrails are as long as the stairways. Check any carpeting to make sure that it is firmly attached to the stairs. Fix any carpet that is loose or worn. Avoid having throw rugs at the top or bottom of the stairs. If you do have throw rugs, attach them to the floor with carpet tape. Make sure that you have a light switch at the top of the stairs and the  bottom of the stairs. If you do not have them, ask someone to add them for you. What else can I do to help prevent falls? Wear shoes that: Do not have high heels. Have rubber bottoms. Are comfortable and fit you well. Are closed at the toe. Do not wear sandals. If you use a stepladder: Make sure that it is fully opened. Do not climb a closed stepladder. Make sure that both sides of the stepladder are locked into place. Ask someone to hold it for you, if possible. Clearly mark and make sure that you can see: Any grab bars or handrails. First and last steps. Where the edge of each step is. Use tools that help you move around (mobility aids) if they are needed. These include: Canes. Walkers. Scooters. Crutches. Turn on the lights when you go into a dark area. Replace any light bulbs as soon as they burn out. Set up your furniture so you have a clear path. Avoid moving your furniture around. If any of your floors are uneven, fix them. If there are any pets around you, be aware of where they are. Review your medicines with your doctor. Some medicines can make you feel dizzy. This can increase your chance of falling. Ask your doctor what other things that you can do to help prevent falls. This information is not intended to replace advice given to you by your health care provider. Make sure you discuss any questions you have with your health care provider. Document Released: 10/28/2008 Document Revised: 06/09/2015 Document Reviewed: 02/05/2014 Elsevier Interactive Patient Education  2017 [employer].

## 2022-08-06 NOTE — Progress Notes (Addendum)
Subjective:   Dana Burke is a 77 y.o. female who presents for Medicare Annual (Subsequent) preventive examination.  Visit Complete: Virtual  I connected with  Dana Burke on 08/06/22 by a audio enabled telemedicine application and verified that I am speaking with the correct person using two identifiers.  Patient Location: Home  Provider Location: Office/Clinic  I discussed the limitations of evaluation and management by telemedicine. The patient expressed understanding and agreed to proceed.   Review of Systems     Cardiac Risk Factors include: advanced age (>15men, >36 women);dyslipidemia;sedentary lifestyle;obesity (BMI >30kg/m2)     Objective:    Today's Vitals   08/06/22 1247  Weight: 245 lb (111.1 kg)  Height: 5\' 11"  (1.803 m)   Body mass index is 34.17 kg/m.     08/06/2022    1:05 PM 07/04/2021    9:51 AM 01/29/2019   11:44 AM 09/18/2018    9:42 AM 04/23/2017   10:29 AM 03/19/2016    9:38 AM 06/22/2015    3:43 PM  Advanced Directives  Does Patient Have a Medical Advance Directive? Yes Yes Yes No Yes Yes Yes  Type of Estate agent of [place];Living will Healthcare Power of [place];Living will Healthcare Power of [place];Living will  Healthcare Power of [address];Living will Healthcare Power of [place];Living will Healthcare Power of [place];Living will  Does patient want to make changes to medical advance directive?  No - Patient declined No - Patient declined  No - Patient declined No - Patient declined No - Patient declined  Copy of Healthcare Power of Attorney in Chart? No - copy requested No - copy requested No - copy requested  No - copy requested No - copy requested No - copy requested    Current Medications (verified) Outpatient Encounter Medications as of 08/06/2022  Medication Sig   acetaminophen (TYLENOL) 325 MG tablet Take by mouth.   albuterol (VENTOLIN HFA) 108 (90 Base) MCG/ACT inhaler Inhale into the lungs every 6  (six) hours as needed for wheezing or shortness of breath.   aspirin EC 81 MG tablet Take 81 mg by mouth daily. Swallow whole.   atorvastatin (LIPITOR) 40 MG tablet Take 1 tablet (40 mg total) by mouth daily.   buPROPion (WELLBUTRIN XL) 300 MG 24 hr tablet Take 1 tablet (300 mg total) by mouth daily.   cetirizine (ZYRTEC) 10 MG tablet Take 10 mg by mouth at bedtime. As needed   Cholecalciferol (VITAMIN D-3) 1000 UNITS CAPS Take 1 capsule by mouth daily.   escitalopram (LEXAPRO) 10 MG tablet TAKE 1 TABLET BY MOUTH ONCE DAILY   esomeprazole (NEXIUM) 40 MG capsule TAKE 1 CAPSULE BY MOUTH ONCE DAILY AT 12NOON   gabapentin (NEURONTIN) 300 MG capsule TAKE 1 CAPSULE BY MOUTH 4 TIMES DAILY   Magnesium Citrate 125 MG CAPS Take 2 capsules by mouth every evening.   Melatonin 10 MG TABS Take 1 tablet by mouth.   metoprolol succinate (TOPROL-XL) 25 MG 24 hr tablet Take 1 tablet (25 mg total) by mouth daily.   Multiple Vitamins-Minerals (MULTIVITAMIN WITH MINERALS) tablet Take 1 tablet by mouth daily.   nitroGLYCERIN (NITROSTAT) 0.4 MG SL tablet Place 1 tablet (0.4 mg total) under the tongue every 5 (five) minutes as needed for chest pain.   Docusate Sodium (COLACE PO) Take 1 capsule by mouth daily. (Patient not taking: Reported on 08/06/2022)   No facility-administered encounter medications on file as of 08/06/2022.    Allergies (verified) Patient has no known allergies.  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  Left dominant system.  Normal LVEF BiV gram and normal LVEDP.   THYROGLOSSAL DUCT CYST N/A 06/22/2015   Procedure: THYROGLOSSAL DUCT CYST;  Surgeon: Geanie Logan, MD;  Location: [facility] ORS;  Service: ENT;  Laterality: N/A;   TRANSTHORACIC ECHOCARDIOGRAM  08/2018   EF 60 to 65%.  GR 1 DD.  Normal RV size and function.  Mild LA dilation.  Normal valves.   Family History  Problem Relation Age of Onset   Heart disease Mother    Hyperlipidemia Mother    Dementia Mother    Hypertension Father    Congestive Heart Failure Father    Hyperlipidemia Father    Diabetes Father    Heart disease Father        Status post pacemaker   Cancer Sister        melanoma   Cancer Sister        breast   Breast cancer Sister        diagnosed 3 times - lastest 41s   Cancer Paternal Uncle        colon   Hypertension Paternal Grandmother    Breast cancer Cousin    Breast cancer Cousin    Breast cancer Cousin    Breast cancer Cousin    Breast cancer Cousin    Hypertension Brother    Cancer Brother        skin cancer   Bladder Cancer Brother    Diabetes Brother    Hypertension Brother    Diabetes Brother    Coronary artery disease Brother 57       4 vessel CABG   Diabetes Brother    Seizures Grandchild    Breast cancer Niece 77   Social History   Socioeconomic History   Marital status: Widowed    Spouse name: Not on file   Number of children: Not on file   Years of education: Not on file   Highest education level: Not on file  Occupational History   Not on file  Tobacco Use   Smoking status: Former    Current packs/day: 0.00    Average packs/day: 0.4 packs/day for 2.0 years  (0.8 ttl pk-yrs)    Types: Cigarettes    Start date: 07/29/1985    Quit date: 07/30/1987    Years since quitting: 35.0   Smokeless tobacco: Never  Vaping Use   Vaping status: Never Used  Substance and Sexual Activity   Alcohol use: No   Drug use: No   Sexual activity: Never  Other Topics Concern  Not on file  Social History Narrative   She is a retired Engineer, civil (consulting).   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 -- Now is helping her brother care for his wife - now on Hospice.   Social Determinants of Health   Financial Resource Strain: Low Risk  (08/06/2022)   Overall Financial Resource Strain (CARDIA)    Difficulty of Paying Living Expenses: Not hard at all  Food Insecurity: No Food Insecurity (08/06/2022)   Hunger Vital Sign    Worried About Running Out of Food in the Last Year: Never true    Ran Out of Food in the Last Year: Never true  Transportation Needs: No Transportation Needs (08/06/2022)   PRAPARE - Administrator, Civil Service (Medical): No    Lack of Transportation (Non-Medical): No  Physical Activity: Inactive (08/06/2022)   Exercise Vital Sign    Days of Exercise per Week: 0 days    Minutes of Exercise per Session: 0 min  Stress: No Stress Concern Present (08/06/2022)   [employer] of Occupational Health - Occupational Stress Questionnaire    Feeling of Stress : Only a little  Social Connections: Moderately Integrated (08/06/2022)   Social Connection and Isolation Panel [NHANES]    Frequency of Communication with Friends and Family: More than three times a week    Frequency of Social Gatherings with Friends and Family: More than three times a week    Attends Religious Services: More than 4 times per year    Active Member of [employer] or Organizations: Yes    Attends Banker Meetings: More than 4 times per year    Marital Status: Widowed    Tobacco Counseling Counseling given: Not Answered   Clinical  Intake:  Pre-visit preparation completed: Yes  Pain : No/denies pain     BMI - recorded: 34.17 Nutritional Status: BMI > 30  Obese Nutritional Risks: None Diabetes: No CBG done?: No Did pt. bring in CBG monitor from home?: No  How often do you need to have someone help you when you read instructions, pamphlets, or other written materials from your doctor or pharmacy?: 1 - Never  Interpreter Needed?: No  Information entered by :: R. Nick Armel LPN   Activities of Daily Living    08/06/2022   12:51 PM 03/22/2022    9:48 AM  In your present state of health, do you have any difficulty performing the following activities:  Hearing? 0 0  Vision? 0 1  Comment readers   Difficulty concentrating or making decisions? 0 0  Walking or climbing stairs? 0 0  Dressing or bathing? 0 0  Doing errands, shopping? 0   Preparing Food and eating ? N   Using the Toilet? N   In the past six months, have you accidently leaked urine? N   Do you have problems with loss of bowel control? N   Managing your Medications? N   Managing your Finances? N   Housekeeping or managing your Housekeeping? N     Patient Care Team: Sherlene Shams, MD as PCP - General (Internal Medicine) Marykay Lex, MD as PCP - Cardiology (Cardiology) Lemar Livings Merrily Pew, MD (General Surgery) Sherlene Shams, MD (Internal Medicine)  Indicate any recent Medical Services you may have received from other than [facility] providers in the past year (date may be approximate).     Assessment:   This is a routine wellness examination for Myeshia.  Hearing/Vision screen  Hearing Screening - Comments:: No issues Vision Screening - Comments:: readers  Dietary issues and exercise activities discussed:     Goals Addressed             This Visit's Progress    Patient Stated       Wants to start back walking       Depression Screen    08/06/2022   12:58 PM 03/14/2022   10:17 AM 09/11/2021   10:08 AM 07/31/2021    4:53 PM  07/04/2021    9:56 AM 04/04/2021    1:26 PM 03/13/2021    4:53 PM  PHQ 2/9 Scores  PHQ - 2 Score 0 0 0 0 1 0 0  PHQ- 9 Score 0 1 0        Fall Risk    08/06/2022    1:08 PM 03/14/2022   10:17 AM 09/11/2021   10:07 AM 07/31/2021    4:53 PM 07/04/2021    9:52 AM  Fall Risk   Falls in the past year? 0 0 0 0 0  Number falls in past yr: 0 0   0  Injury with Fall? 0 0     Risk for fall due to : No Fall Risks No Fall Risks No Fall Risks No Fall Risks   Follow up Falls prevention discussed;Falls evaluation completed Falls evaluation completed Falls evaluation completed Falls evaluation completed Falls evaluation completed    MEDICARE RISK AT HOME:  Medicare Risk at Home - 08/06/22 1309     Any stairs in or around the home? Yes    If so, are there any without handrails? Yes    Home free of loose throw rugs in walkways, pet beds, electrical cords, etc? Yes    Adequate lighting in your home to reduce risk of falls? Yes    Life alert? Yes    Use of a cane, walker or w/c? No    Grab bars in the bathroom? No    Shower chair or bench in shower? Yes    Elevated toilet seat or a handicapped toilet? No                 Cognitive Function:    04/23/2017   10:31 AM 03/19/2016    9:44 AM  MMSE - Mini Mental State Exam  Orientation to time 5 5  Orientation to Place 5 5  Registration 3 3  Attention/ Calculation 5 5  Recall 3 3  Language- name 2 objects 2 2  Language- repeat 1 1  Language- follow 3 step command 3 3  Language- read & follow direction 1 1  Write a sentence 1 1  Copy design 1 1  Total score 30 30        08/06/2022    1:06 PM 01/29/2019   11:51 AM  6CIT Screen  What Year? 0 points 0 points  What month? 0 points 0 points  What time? 0 points 0 points  Count back from 20 0 points 0 points  Months in reverse 0 points 0 points  Repeat phrase 0 points 0 points  Total Score 0 points 0 points    Immunizations Immunization History  Administered Date(s) Administered    Fluad Quad(high Dose 65+) 10/25/2021   Influenza, High Dose Seasonal PF 10/29/2012, 12/02/2015, 11/28/2016, 11/04/2017, 09/26/2018   Influenza,inj,Quad PF,6+ Mos 11/03/2013   Influenza-Unspecified 10/28/2014, 09/30/2020   PFIZER(Purple Top)SARS-COV-2 Vaccination 02/07/2019, 02/28/2019, 10/15/2019, 04/29/2020   Pfizer Covid-19 Vaccine Bivalent Booster 57yrs &  up 09/30/2020   Pneumococcal Conjugate-13 10/29/2012   Pneumococcal Polysaccharide-23 05/05/2014   Td 08/01/1994, 06/27/2004   Tdap 11/25/2006, 05/01/2013   Zoster, Live 04/16/2006, 12/30/2007    TDAP status: Up to date  Flu Vaccine status: Up to date  Pneumococcal vaccine status: Up to date  Covid-19 vaccine status: Completed vaccines  Qualifies for Shingles Vaccine? Yes   Zostavax completed Yes   Shingrix Completed?: No.    Education has been provided regarding the importance of this vaccine. Patient has been advised to call insurance company to determine out of pocket expense if they have not yet received this vaccine. Advised may also receive vaccine at local pharmacy or Health Dept. Verbalized acceptance and understanding.  Screening Tests Health Maintenance  Topic Date Due   FOOT EXAM  Never done   OPHTHALMOLOGY EXAM  Never done   Zoster Vaccines- Shingrix (1 of 2) 04/05/1962   COVID-19 Vaccine (6 - 2023-24 season) 09/15/2021   Diabetic kidney evaluation - Urine ACR  12/14/2021   Medicare Annual Wellness (AWV)  07/05/2022   INFLUENZA VACCINE  08/16/2022   HEMOGLOBIN A1C  09/07/2022   Diabetic kidney evaluation - eGFR measurement  03/15/2023   DTaP/Tdap/Td (5 - Td or Tdap) 05/02/2023   MAMMOGRAM  06/08/2023   Pneumonia Vaccine 6+ Years old  Completed   DEXA SCAN  Completed   Hepatitis C Screening  Completed   HPV VACCINES  Aged Out   Colonoscopy  Discontinued    Health Maintenance  Health Maintenance Due  Topic Date Due   FOOT EXAM  Never done   OPHTHALMOLOGY EXAM  Never done   Zoster Vaccines- Shingrix  (1 of 2) 04/05/1962   COVID-19 Vaccine (6 - 2023-24 season) 09/15/2021   Diabetic kidney evaluation - Urine ACR  12/14/2021   Medicare Annual Wellness (AWV)  07/05/2022    Colorectal cancer screening: No longer required.   Mammogram status: Completed 5/24. Repeat every year  Bone Density status: Completed 5/17. Results reflect: Bone density results: OSTEOPOROSIS. Repeat every 2 years. Patient having cardiac workup, will discuss bone density scan with PCP later.  Lung Cancer Screening: (Low Dose CT Chest recommended if Age 75-80 years, 20 pack-year currently smoking OR have quit w/in 15years.) does qualify.   Lung Cancer Screening Referral: every 6 months per patient  Additional Screening:  Hepatitis C Screening: does qualify; Completed 04/2014  Vision Screening: Recommended annual ophthalmology exams for early detection of glaucoma and other disorders of the eye. Is the patient up to date with their annual eye exam?  Yes  Who is the provider or what is the name of the office in which the patient attends annual eye exams? [facility] If pt is not established with a provider, would they like to be referred to a provider to establish care? Yes .   Dental Screening: Recommended annual dental exams for proper oral hygiene    Community Resource Referral / Chronic Care Management: CRR required this visit?  No   CCM required this visit?  No     Plan:     I have personally reviewed and noted the following in the patient's chart:   Medical and social history Use of alcohol, tobacco or illicit drugs  Current medications and supplements including opioid prescriptions. Patient is not currently taking opioid prescriptions. Functional ability and status Nutritional status Physical activity Advanced directives List of other physicians Hospitalizations, surgeries, and ER visits in previous 12 months Vitals Screenings to include cognitive, depression, and falls Referrals and  appointments  In addition, I have reviewed and discussed with patient certain preventive protocols, quality metrics, and best practice recommendations. A written personalized care plan for preventive services as well as general preventive health recommendations were provided to patient.     Sydell Axon, LPN   1/61/0960   After Visit Summary: (Declined) Due to this being a telephonic visit, with patients personalized plan was offered to patient but patient Declined AVS at this time   Nurse Notes: None    I have reviewed the above information and agree with above.   Duncan Dull, MD

## 2022-08-24 ENCOUNTER — Encounter: Payer: Self-pay | Admitting: Pulmonary Disease

## 2022-08-24 NOTE — Progress Notes (Signed)
Subjective:    Patient ID: Dana Burke, female    DOB: [birth year], 77 y.o.   MRN: [id number]  Patient Care Team: Sherlene Shams, MD as PCP - General (Internal Medicine) Marykay Lex, MD as PCP - Cardiology (Cardiology) Lemar Livings Merrily Pew, MD (General Surgery) Sherlene Shams, MD (Internal Medicine)  Chief Complaint  Patient presents with   Follow-up    CT results. No SOB or cough. Covid one month ago. Occasional wheezing.     HPI The patient is a 77 year old remote former smoker with minimal smoking history and a history as noted below, who presents for follow-up on the issue of multiple lung nodules on CT chest.  She was last seen here on 08 August 2021.  Recall that previously she had been evaluated here in 2020 for issues with cough and dysphonia.  This issues subsequently resolved.  The patient was then followed up by primary care.  In the interim the patient had hysterectomy and bilateral salpingo-oophorectomy for bilateral adnexal masses on 17 January 2021.  She had a serous cystoadenofibroma.  Oncologic follow-up was not necessary.  A CT performed during workup of that mass showed that she had multiple small nodules the largest of which was on the left.  She had CT chest performed 19 July and showed that this larger nodule had enlarged from January but remained stable from the March CT.  Upon review it appears that this is running along the fissure and may be related to an intrapulmonary lymph node.  He had a CT chest follow-up on 19 October that shows that her pulmonary nodules measure up to about 8 mm in the left lower lobe but these are unchanged from March 2023.  Recommendation is to follow-up in a year.   The patient has been asymptomatic with regards to these findings.  She does not endorse any shortness of breath, no fevers, chills or sweats.  She had weight loss after her surgery in January but this has stabilized.  She does not endorse any other symptomatology.  She had  COVID-19 a month ago but did not have severe illness and has not had any significant sequela.   We reviewed the films with the patient and she was allowed to ask questions.  She is not inclined to have invasive procedures.   Review of Systems A 10 point review of systems was performed and it is as noted above otherwise negative.   Patient Active Problem List   Diagnosis Date Noted   Coronary artery disease, non-occlusive 03/14/2022   Diabetes mellitus type II, non insulin dependent (HCC) 03/09/2022   S/P TAH-BSO 09/11/2021   Acute right flank pain 07/31/2021   Hepatic steatosis 04/04/2021   Renal cyst, acquired, left 04/04/2021   Hiatal hernia 04/04/2021   Multiple pulmonary nodules 03/14/2021   Lesion of pancreas 01/10/2021   Malignant ovarian neoplasm, unspecified laterality (HCC) 12/26/2020   Dysphonia 12/14/2020   Irregular heartbeat 12/14/2020   Chronic epigastric pain 12/14/2020   DOE (dyspnea on exertion) 08/13/2018   Encounter for preventive health examination 12/04/2015   Insomnia 12/04/2015   Neuropathy 06/02/2014   Major depressive disorder, single episode 05/08/2014   Screening for osteoporosis 05/08/2014   History of DVT of lower extremity 05/05/2014   Hyperlipidemia with target low density lipoprotein (LDL) cholesterol less than 100 mg/dL 02/72/5366   Medicare annual wellness visit, subsequent 05/03/2013   Special screening for malignant neoplasms, colon 09/04/2012   Obesity 07/31/2012   History of abnormal  mammogram 07/31/2012   Family history of malignant melanoma 07/31/2012   GERD (gastroesophageal reflux disease)     Social History   Tobacco Use   Smoking status: Former    Current packs/day: 0.00    Average packs/day: 0.4 packs/day for 2.0 years (0.8 ttl pk-yrs)    Types: Cigarettes    Start date: 07/29/1985    Quit date: 07/30/1987    Years since quitting: 35.0   Smokeless tobacco: Never  Substance Use Topics   Alcohol use: No    No Known  Allergies  Current Meds  Medication Sig   acetaminophen (TYLENOL) 325 MG tablet Take by mouth.   albuterol (VENTOLIN HFA) 108 (90 Base) MCG/ACT inhaler Inhale into the lungs every 6 (six) hours as needed for wheezing or shortness of breath.   cetirizine (ZYRTEC) 10 MG tablet Take 10 mg by mouth at bedtime. As needed   Cholecalciferol (VITAMIN D-3) 1000 UNITS CAPS Take 1 capsule by mouth daily.   Docusate Sodium (COLACE PO) Take 1 capsule by mouth daily. (Patient not taking: Reported on 08/06/2022)   Magnesium Citrate 125 MG CAPS Take 2 capsules by mouth every evening.   Melatonin 10 MG TABS Take 1 tablet by mouth.   Multiple Vitamins-Minerals (MULTIVITAMIN WITH MINERALS) tablet Take 1 tablet by mouth daily.   [DISCONTINUED] buPROPion (WELLBUTRIN XL) 300 MG 24 hr tablet Take 1 tablet (300 mg total) by mouth daily.   [DISCONTINUED] calcium carbonate (OS-CAL) 600 MG TABS Take 600 mg by mouth 2 (two) times daily with a meal. (Patient not taking: Reported on 03/22/2022)   [DISCONTINUED] escitalopram (LEXAPRO) 10 MG tablet Take 1 tablet (10 mg total) by mouth daily.   [DISCONTINUED] esomeprazole (NEXIUM) 40 MG capsule TAKE 1 CAPSULE BY MOUTH ONCE DAILY AT NOON   [DISCONTINUED] gabapentin (NEURONTIN) 300 MG capsule TAKE 1 CAPSULE BY MOUTH 4 TIMES DAILY   [DISCONTINUED] hydrocortisone 2.5 % cream Apply to right ear twice daily until itch and scale improved. (Patient not taking: Reported on 03/14/2022)   [DISCONTINUED] nystatin (MYCOSTATIN/NYSTOP) powder Apply 1 application topically 2 (two) times daily. To rash until resolved. (Patient not taking: Reported on 03/14/2022)   [DISCONTINUED] simvastatin (ZOCOR) 40 MG tablet TAKE 1 TABLET BY MOUTH AT BEDTIME *DOSE INCREASE*    Immunization History  Administered Date(s) Administered   Fluad Quad(high Dose 65+) 10/25/2021   Influenza, High Dose Seasonal PF 10/29/2012, 12/02/2015, 11/28/2016, 11/04/2017, 09/26/2018   Influenza,inj,Quad PF,6+ Mos 11/03/2013    Influenza-Unspecified 10/28/2014, 09/30/2020   PFIZER(Purple Top)SARS-COV-2 Vaccination 02/07/2019, 02/28/2019, 10/15/2019, 04/29/2020   Pfizer Covid-19 Vaccine Bivalent Booster 45yrs & up 09/30/2020   Pneumococcal Conjugate-13 10/29/2012   Pneumococcal Polysaccharide-23 05/05/2014   Td 08/01/1994, 06/27/2004   Tdap 11/25/2006, 05/01/2013   Zoster, Live 04/16/2006, 12/30/2007        Objective:     BP 124/80 (BP Location: Left Arm, Cuff Size: Large)   Pulse 80   Temp 97.9 F (36.6 C)   Ht 5\' 11"  (1.803 m)   Wt 233 lb 12.8 oz (106.1 kg)   SpO2 95%   BMI 32.61 kg/m   SpO2: 95 % O2 Device: None (Room air)  GENERAL: Obese woman, no acute distress, fully ambulatory, no conversational dyspnea. HEAD: Normocephalic, atraumatic.  EYES: Pupils equal, round, reactive to light.  No scleral icterus.  MOUTH: Dentition intact, no thrush, oral mucosa moist. NECK: Supple. No thyromegaly. Trachea midline. No JVD.  No adenopathy. PULMONARY: Good air entry bilaterally.  No adventitious sounds. CARDIOVASCULAR: S1 and S2. Regular  rate and rhythm.  No rubs, murmurs or gallops heard. ABDOMEN: Obese otherwise benign. MUSCULOSKELETAL: No joint deformity, no clubbing, no edema.  NEUROLOGIC: No overt focal deficit, no gait disturbance, speech is fluent. SKIN: Intact,warm,dry. PSYCH: Mood and behavior normal  Representative image from CT performed 02 November 2021 showing the prominent nodule in question which is likely an intrapulmonary lymph node:     Assessment & Plan:     ICD-10-CM   1. Multiple lung nodules  R91.8 CT CHEST WO CONTRAST   Recommend follow-up in 6 months time with CT CT chest ordered    2. Cystadenofibroma of ovary, unspecified laterality  D27.9    For further evaluation Resected in toto      Orders Placed This Encounter  Procedures   CT CHEST WO CONTRAST    In 6 months    Standing Status:   Future    Number of Occurrences:   1    Standing Expiration Date:    11/09/2022    Order Specific Question:   Preferred imaging location?    Answer:   [facility]   Will see the patient in follow-up after 6 months CT is performed.  She is to contact us prior to that time should any new difficulties arise.  Gailen Shelter, MD Advanced Bronchoscopy PCCM [facility]    *This note was dictated using voice recognition software/Dragon.  Despite best efforts to proofread, errors can occur which can change the meaning. Any transcriptional errors that result from this process are unintentional and may not be fully corrected at the time of dictation.

## 2022-09-04 ENCOUNTER — Telehealth: Payer: Self-pay | Admitting: Internal Medicine

## 2022-09-05 NOTE — Telephone Encounter (Signed)
Medication has been refilled.

## 2022-09-05 NOTE — Telephone Encounter (Signed)
Prescription Request  09/05/2022  LOV: 03/14/2022  What is the name of the medication or equipment? escitalopram (LEXAPRO) 10 MG tablet  Have you contacted your pharmacy to request a refill? Yes   Which pharmacy would you like this sent to?   [facility],  - [address] [place] [address] Phone: [phone] Fax: [phone]    Patient notified that their request is being sent to the clinical staff for review and that they should receive a response within 2 business days.   Please advise at Mobile [phone] (mobile)   Pt stated she's going out of town today and will need her med.

## 2022-09-20 ENCOUNTER — Other Ambulatory Visit: Payer: Self-pay | Admitting: Internal Medicine

## 2022-09-28 ENCOUNTER — Other Ambulatory Visit (INDEPENDENT_AMBULATORY_CARE_PROVIDER_SITE_OTHER): Payer: Medicare Other

## 2022-09-28 DIAGNOSIS — E785 Hyperlipidemia, unspecified: Secondary | ICD-10-CM

## 2022-09-28 DIAGNOSIS — I25110 Atherosclerotic heart disease of native coronary artery with unstable angina pectoris: Secondary | ICD-10-CM

## 2022-09-28 DIAGNOSIS — R7303 Prediabetes: Secondary | ICD-10-CM

## 2022-09-28 LAB — COMPREHENSIVE METABOLIC PANEL
ALT: 22 U/L (ref 0–35)
AST: 18 U/L (ref 0–37)
Albumin: 3.7 g/dL (ref 3.5–5.2)
Alkaline Phosphatase: 96 U/L (ref 39–117)
BUN: 13 mg/dL (ref 6–23)
CO2: 30 meq/L (ref 19–32)
Calcium: 9.8 mg/dL (ref 8.4–10.5)
Chloride: 102 meq/L (ref 96–112)
Creatinine, Ser: 0.85 mg/dL (ref 0.40–1.20)
GFR: 65.13 mL/min (ref >60.00)
Glucose, Bld: 254 mg/dL — ABNORMAL HIGH (ref 70–99)
Potassium: 4.5 meq/L (ref 3.5–5.1)
Sodium: 137 meq/L (ref 135–145)
Total Bilirubin: 0.7 mg/dL (ref 0.2–1.2)
Total Protein: 6.4 g/dL (ref 6.0–8.3)

## 2022-09-28 LAB — TROPONIN I (HIGH SENSITIVITY): High Sens Troponin I: 4 ng/L (ref 2–17)

## 2022-09-28 LAB — LDL CHOLESTEROL, DIRECT: Direct LDL: 80 mg/dL

## 2022-09-28 LAB — LIPID PANEL
Cholesterol: 125 mg/dL (ref 0–200)
HDL: 30.1 mg/dL — ABNORMAL LOW (ref >39.00)
LDL Cholesterol: 58 mg/dL (ref 0–99)
NonHDL: 94.49
Total CHOL/HDL Ratio: 4
Triglycerides: 184 mg/dL — ABNORMAL HIGH (ref 0.0–149.0)
VLDL: 36.8 mg/dL (ref 0.0–40.0)

## 2022-09-28 LAB — HEMOGLOBIN A1C: Hgb A1c MFr Bld: 11.3 % — ABNORMAL HIGH (ref 4.6–6.5)

## 2022-09-30 ENCOUNTER — Encounter: Payer: Self-pay | Admitting: Internal Medicine

## 2022-10-01 ENCOUNTER — Encounter: Payer: Self-pay | Admitting: Internal Medicine

## 2022-10-03 NOTE — Progress Notes (Unsigned)
Established Patient Office Visit  Subjective:  Patient ID: Dana Burke, female    DOB: [birth year]  Age: 77 y.o. MRN: [id number]  CC: No chief complaint on file.   HPI  [name] presents for follow-up on hemoglobin A1c 11.3 on 09/28/2022.  She has history of hyperlipidemia, CAD, DOE, GERD, obesity and depression she is not taking any diabetes medication at present. ????? family or personal history of MTC.  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  Left dominant system.  Normal LVEF BiV gram and normal LVEDP.   THYROGLOSSAL DUCT CYST N/A 06/22/2015   Procedure: THYROGLOSSAL DUCT CYST;  Surgeon: Geanie Logan, MD;  Location: [facility] ORS;  Service: ENT;  Laterality: N/A;   TRANSTHORACIC ECHOCARDIOGRAM  08/2018   EF 60 to 65%.  GR 1 DD.  Normal RV size and function.  Mild LA dilation.  Normal valves.    Family History  Problem Relation Age of Onset   Heart disease Mother    Hyperlipidemia Mother    Dementia Mother    Hypertension Father    Congestive Heart Failure Father    Hyperlipidemia Father    Diabetes Father    Heart disease Father        Status post pacemaker    Cancer Sister        melanoma   Cancer Sister        breast   Breast cancer Sister        diagnosed 3 times - lastest 70s   Cancer Paternal Uncle        colon   Hypertension Paternal Grandmother    Breast cancer Cousin    Breast cancer Cousin    Breast cancer Cousin    Breast cancer Cousin    Breast cancer Cousin    Hypertension Brother    Cancer Brother        skin cancer   Bladder Cancer Brother    Diabetes Brother    Hypertension Brother    Diabetes Brother    Coronary artery disease Brother 65       4 vessel CABG   Diabetes Brother    Seizures Grandchild    Breast cancer Niece 72    Social History   Socioeconomic History   Marital status: Widowed    Spouse name: Not on file   Number of children: Not on file   Years of education: Not on file   Highest education level: Not on file  Occupational History   Not on file  Tobacco Use   Smoking status: Former  Current packs/day: 0.00    Average packs/day: 0.4 packs/day for 2.0 years (0.8 ttl pk-yrs)    Types: Cigarettes    Start date: 07/29/1985    Quit date: 07/30/1987    Years since quitting: 35.2   Smokeless tobacco: Never  Vaping Use   Vaping status: Never Used  Substance and Sexual Activity   Alcohol use: No   Drug use: No   Sexual activity: Never  Other Topics Concern   Not on file  Social History Narrative   She is a retired Engineer, civil (consulting).   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 -- Now is helping her brother care for his wife - now on Hospice.   Social Determinants of Health   Financial Resource Strain: Low Risk  (08/06/2022)   Overall Financial Resource Strain (CARDIA)    Difficulty of Paying Living Expenses: Not hard at all  Food Insecurity: No Food Insecurity (08/06/2022)   Hunger Vital Sign    Worried About Running Out of Food in the Last Year: Never true    Ran Out of Food in the Last Year: Never true  Transportation Needs: No Transportation Needs (08/06/2022)    PRAPARE - Administrator, Civil Service (Medical): No    Lack of Transportation (Non-Medical): No  Physical Activity: Inactive (08/06/2022)   Exercise Vital Sign    Days of Exercise per Week: 0 days    Minutes of Exercise per Session: 0 min  Stress: No Stress Concern Present (08/06/2022)   [employer] of Occupational Health - Occupational Stress Questionnaire    Feeling of Stress : Only a little  Social Connections: Moderately Integrated (08/06/2022)   Social Connection and Isolation Panel [NHANES]    Frequency of Communication with Friends and Family: More than three times a week    Frequency of Social Gatherings with Friends and Family: More than three times a week    Attends Religious Services: More than 4 times per year    Active Member of [employer] or Organizations: Yes    Attends Banker Meetings: More than 4 times per year    Marital Status: Widowed  Intimate Partner Violence: Not At Risk (08/06/2022)   Humiliation, Afraid, Rape, and Kick questionnaire    Fear of Current or Ex-Partner: No    Emotionally Abused: No    Physically Abused: No    Sexually Abused: No     Outpatient Medications Prior to Visit  Medication Sig Dispense Refill   acetaminophen (TYLENOL) 325 MG tablet Take by mouth.     albuterol (VENTOLIN HFA) 108 (90 Base) MCG/ACT inhaler Inhale into the lungs every 6 (six) hours as needed for wheezing or shortness of breath.     aspirin EC 81 MG tablet Take 81 mg by mouth daily. Swallow whole.     atorvastatin (LIPITOR) 40 MG tablet Take 1 tablet (40 mg total) by mouth daily. 90 tablet 3   buPROPion (WELLBUTRIN XL) 300 MG 24 hr tablet Take 1 tablet (300 mg total) by mouth daily. 90 tablet 2   cetirizine (ZYRTEC) 10 MG tablet Take 10 mg by mouth at bedtime. As needed     Cholecalciferol (VITAMIN D-3) 1000 UNITS CAPS Take 1 capsule by mouth daily.     Docusate Sodium (COLACE PO) Take 1 capsule by mouth daily. (Patient not taking: Reported on  08/06/2022)     escitalopram (LEXAPRO) 10 MG tablet TAKE 1 TABLET BY MOUTH ONCE DAILY 90 tablet 1  esomeprazole (NEXIUM) 40 MG capsule TAKE 1 CAPSULE BY MOUTH ONCE DAILY AT 12NOON 90 capsule 3   gabapentin (NEURONTIN) 300 MG capsule TAKE 1 CAPSULE BY MOUTH 4 TIMES DAILY 360 capsule 1   Magnesium Citrate 125 MG CAPS Take 2 capsules by mouth every evening. 180 capsule 2   Melatonin 10 MG TABS Take 1 tablet by mouth.     metoprolol succinate (TOPROL-XL) 25 MG 24 hr tablet Take 1 tablet (25 mg total) by mouth daily. 90 tablet 3   Multiple Vitamins-Minerals (MULTIVITAMIN WITH MINERALS) tablet Take 1 tablet by mouth daily.     nitroGLYCERIN (NITROSTAT) 0.4 MG SL tablet Place 1 tablet (0.4 mg total) under the tongue every 5 (five) minutes as needed for chest pain. 50 tablet 3   No facility-administered medications prior to visit.    No Known Allergies  ROS Review of Systems Negative unless indicated in HPI.    Objective:    Physical Exam  There were no vitals taken for this visit. Wt Readings from Last 3 Encounters:  08/06/22 245 lb (111.1 kg)  07/24/22 249 lb (112.9 kg)  04/25/22 244 lb 9.6 oz (110.9 kg)     Health Maintenance  Topic Date Due   FOOT EXAM  Never done   OPHTHALMOLOGY EXAM  Never done   Zoster Vaccines- Shingrix (1 of 2) 04/05/1962   Diabetic kidney evaluation - Urine ACR  12/14/2021   INFLUENZA VACCINE  08/16/2022   COVID-19 Vaccine (6 - 2023-24 season) 09/16/2022   HEMOGLOBIN A1C  03/28/2023   DTaP/Tdap/Td (5 - Td or Tdap) 05/02/2023   MAMMOGRAM  06/08/2023   Medicare Annual Wellness (AWV)  08/06/2023   Diabetic kidney evaluation - eGFR measurement  09/28/2023   Pneumonia Vaccine 56+ Years old  Completed   DEXA SCAN  Completed   Hepatitis C Screening  Completed   HPV VACCINES  Aged Out   Colonoscopy  Discontinued    There are no preventive care reminders to display for this patient.  Lab Results  Component Value Date   TSH 1.19 12/14/2020   Lab  Results  Component Value Date   WBC 6.4 03/15/2022   HGB 13.8 03/15/2022   HCT 42.0 03/15/2022   MCV 93.1 03/15/2022   PLT 210 03/15/2022   Lab Results  Component Value Date   NA 137 09/28/2022   K 4.5 09/28/2022   CO2 30 09/28/2022   GLUCOSE 254 (H) 09/28/2022   BUN 13 09/28/2022   CREATININE 0.85 09/28/2022   BILITOT 0.7 09/28/2022   ALKPHOS 96 09/28/2022   AST 18 09/28/2022   ALT 22 09/28/2022   PROT 6.4 09/28/2022   ALBUMIN 3.7 09/28/2022   CALCIUM 9.8 09/28/2022   ANIONGAP 9 03/15/2022   GFR 65.13 09/28/2022   Lab Results  Component Value Date   CHOL 125 09/28/2022   Lab Results  Component Value Date   HDL 30.10 (L) 09/28/2022   Lab Results  Component Value Date   LDLCALC 58 09/28/2022   Lab Results  Component Value Date   TRIG 184.0 (H) 09/28/2022   Lab Results  Component Value Date   CHOLHDL 4 09/28/2022   Lab Results  Component Value Date   HGBA1C 11.3 (H) 09/28/2022      Assessment & Plan:  There are no diagnoses linked to this encounter.  Follow-up: No follow-ups on file.   Kara Dies, NP

## 2022-10-04 ENCOUNTER — Telehealth: Payer: Self-pay

## 2022-10-04 ENCOUNTER — Encounter: Payer: Self-pay | Admitting: Nurse Practitioner

## 2022-10-04 ENCOUNTER — Ambulatory Visit: Payer: Medicare Other | Admitting: Nurse Practitioner

## 2022-10-04 VITALS — BP 118/76 | HR 75 | Temp 98.3°F | Ht 71.0 in | Wt 234.6 lb

## 2022-10-04 DIAGNOSIS — Z7985 Long-term (current) use of injectable non-insulin antidiabetic drugs: Secondary | ICD-10-CM | POA: Diagnosis not present

## 2022-10-04 DIAGNOSIS — E119 Type 2 diabetes mellitus without complications: Secondary | ICD-10-CM

## 2022-10-04 DIAGNOSIS — Z23 Encounter for immunization: Secondary | ICD-10-CM

## 2022-10-04 DIAGNOSIS — Z7984 Long term (current) use of oral hypoglycemic drugs: Secondary | ICD-10-CM

## 2022-10-04 LAB — POCT GLYCOSYLATED HEMOGLOBIN (HGB A1C): Hemoglobin A1C: 11.8 % — AB (ref 4.0–5.6)

## 2022-10-04 MED ORDER — METFORMIN HCL ER 500 MG PO TB24: 500.0000 mg | ORAL_TABLET | Freq: Every day | ORAL | 1 refills | Status: DC

## 2022-10-04 NOTE — Patient Instructions (Addendum)
Ozempic sample provided, please use 0.25 Mg weekly for 4 weeks. Started on metformin 500 mg once a day.  DME order provided for CGM and glucometer according to patients affordability.  Ambulatory referral sent to pharamacy. Advised to check BS fasting and at night before going to bed

## 2022-10-04 NOTE — Telephone Encounter (Signed)
Medication Samples have been provided to the patient.  Drug name: Ozempic       Strength: 0.25 mg        Qty: 1 box  LOT: [id number]  Exp.Date: 03/14/2024  Dosing instructions: Inject 0.25 mg into skin once weekly.   The patient has been instructed regarding the correct time, dose, and frequency of taking this medication, including desired effects and most common side effects.   Dana Burke 11:03 AM 10/04/2022

## 2022-10-05 ENCOUNTER — Telehealth: Payer: Self-pay | Admitting: Internal Medicine

## 2022-10-05 ENCOUNTER — Other Ambulatory Visit: Payer: Medicare Other | Admitting: Pharmacist

## 2022-10-05 ENCOUNTER — Encounter: Payer: Self-pay | Admitting: Nurse Practitioner

## 2022-10-05 NOTE — Progress Notes (Signed)
10/05/2022 Name: Dana Burke MRN: [id number] DOB: [birth year]  Subjective  No chief complaint on file.   Reason for visit: Dana Burke is a 77 y.o. year old female who presented for a telephone visit.   They were referred to the pharmacist by their PCP for assistance in managing diabetes and glucometer/CGM access .   Care Team: Primary Care Provider: Sherlene Shams, MD ; Next Scheduled Visit: 10/18/22 Cardiology. Next visit 10/26/22  Reason for visit: ?  Dana Burke is a 77 y.o. female with a history of diabetes (type 2), who presents today for a diabetes pharmacotherapy visit.?    Known DM Complications: no known complications    Date of Last Diabetes Related Visit: 10/04/22 with NP    Since Last visit / History of Present Illness: ?  Saw NP yesterday for recent worsening of diabetes who recommended blood glucose monitoring with CGM. Reports strong family history of type 2 diabetes, though has not been diagnosed until this year.   Daughter is a Engineer, civil (consulting) and POA and has been trying to help her with understanding Medicare coverage of her medications/glucometer. Patient also reports that her daughter is into wellness and mentions a more natural supplement that she feels may help instead of metformin (Berberine).  Patient lives in a household of 1. Income includes ~$500/month from husband's pension (widowed) + Tree surgeon.  Metformin XR: $10/month which is affordable for her. CGM and Ozempic are not affordable at her pharmacy given no prescription insurance coverage.    Reported DM Regimen: ?  Metformin XR 500 mg daily with breakfast (Not taking, has not started yet) Ozempic 0.25 mg sq once weekly (Not taking, has not started yet)   DM medications tried in the past:? N/A  Reported Diet: Patient typically eats 3 meals per day.  Breakfast: Glass of skim milk; cereal; grits; bagel; sometimes boiled eggs Lunch: Sandwith (tomato; banana; Ham); Sometimes handful  of chips Dinner: Varies; Normally a meat in the crockpot + 1 vegetable; sometimes carb side Snacks: N/A Beverages: 1 glass of skim milk/day; Sweet tea (stopped); Pepsi (stopped); Has replaced sugar beverages with flavored water (no sugar) since receiving news of elevated A1c result.   SMBG  Does not have glucometer currently : ? Has an iPhone (though reports 'not tech savvy - daughter is a good support for this. Patient interested in glucometer device that is separate form her phone.    Hypo/Hyperglycemia: ?  Symptoms of hypoglycemia since last visit:? no  If yes, it was treated by: n/a  Symptoms of hyperglycemia since last visit:? yes - excessive thirst  DM Prevention:  Statin: Taking; high intensity.?  Aspirin - Taking ?  ACEI/ARB - Not taking; Urine MA/CR Ratio - normal 1.4 (12/14/20) - Due Last eye exam: Assess at next follow up Last foot exam: No foot exam found Tobacco Use: Former Immunizations:?  - Flu vaccine 10/04/22 - Complete - Zostavax 2008, 2009 - Due for Shingrix if not received - Tdap - Due 05/02/23 - Pneumo: PCV13 (2014), PPSV23 (2016)  Cardiovascular Risk Reduction History of clinical ASCVD?  Unclear, cardiology following for CP. ASCVD visualized on CT in 2023 The ASCVD Risk score (Arnett DK, et al., 2019) failed to calculate for the following reasons:   The valid total cholesterol range is 130 to 320 mg/dL History of heart failure? no History of hyperlipidemia? yes Current BMI: There is no height or weight on file to calculate BMI.  Taking statin? yes; high intensity Taking aspirin?  unclear if indicated; Taking  (cardiology follow up scheduled) Taking SGLT-2i? no Taking GLP- 1 RA? no Taking ACEi/ARB? no      ________________________________________________  Objective    Review of Systems:?  Constitutional:? No fever, chills or unintentional weight loss  Cardiovascular:? No chest pain or pressure, shortness of breath, dyspnea on exertion, orthopnea or LE  edema  Pulmonary:? No cough or shortness of breath  GI:? No nausea, vomiting, constipation, diarrhea, abdominal pain, dyspepsia, change in bowel habits  Endocrine:? No polyuria, polyphagia or blurred vision    Physical Examination:  Vitals:  Wt Readings from Last 3 Encounters:  10/04/22 234 lb 9.6 oz (106.4 kg)  08/06/22 245 lb (111.1 kg)  07/24/22 249 lb (112.9 kg)   BP Readings from Last 3 Encounters:  10/04/22 118/76  07/24/22 102/66  04/25/22 118/74   Pulse Readings from Last 3 Encounters:  10/04/22 75  07/24/22 73  04/25/22 74     The ASCVD Risk score (Arnett DK, et al., 2019) failed to calculate for the following reasons:   The valid total cholesterol range is 130 to 320 mg/dL    Labs:?  Lab Results  Component Value Date   HGBA1C 11.8 (A) 10/04/2022   HGBA1C 11.3 (H) 09/28/2022   HGBA1C 7.2 (H) 03/09/2022   GLUCOSE 254 (H) 09/28/2022   GLUCOSE 119 (H) 03/15/2022   GLUCOSE 127 (H) 03/09/2022   MICRALBCREAT 1.4 12/14/2020   CREATININE 0.85 09/28/2022   CREATININE 0.88 03/15/2022   CREATININE 0.84 03/09/2022   GFR 65.13 09/28/2022   GFR 66.32 03/09/2022   GFR 63.86 08/01/2021    Lab Results  Component Value Date   CHOL 125 09/28/2022   LDLDIRECT 80.0 09/28/2022   AST 18 09/28/2022   ALT 22 09/28/2022      Chemistry      Component Value Date/Time   NA 137 09/28/2022 1349   K 4.5 09/28/2022 1349   CL 102 09/28/2022 1349   CO2 30 09/28/2022 1349   BUN 13 09/28/2022 1349   CREATININE 0.85 09/28/2022 1349      Component Value Date/Time   CALCIUM 9.8 09/28/2022 1349   ALKPHOS 96 09/28/2022 1349   AST 18 09/28/2022 1349   ALT 22 09/28/2022 1349   BILITOT 0.7 09/28/2022 1349        Assessment and Plan:   1. Diabetes, type 2:  Diagnosed with diabetes in February with A1c of 7.2%, significantly increased per last A1c of 11.6% (10/04/22). Goal <7% without hypoglycemia. Recently prescribed metformin and Ozempic though has not started taking either yet  (received Ozempic sample in clinic). eGFR and LFTs normal on 09/28/22 labs. Start metformin XR 500 mg 1 tablet daily (start today) Ozempic 0.25 mg once weekly starting tomorrow (to rule out possible side effects with metformin start) Diet: Continue obtaining majority of meals at home (as opposed to eating out). Revisit new dietary goals at follow up visit. Exercise: Sedentary. Plans to increase walking.   Not eligible for CGM coverage through Medicare B due to not being on insulin.  Rx pended to provider in [place] for glucometer kit  Reviewed signs/symptoms of hyperglycemia Labs: Consider updated UACR next in person visit  (last 2022 and was normal) Diabetes Education Reviewed long-term adverse effects of hyperglycemia including retinopathy, neuropathy, nephropathy and macrovascular complications (increased risk of stroke/MI). Reviewed with the patient that while on a GLP1RA, eating smaller meals, avoiding high-fat foods, and remaining upright after eating may reduce nausea. Also discussed that overeating is a major  trigger of nausea with GLP1RAs, as often times patients will start to feel full sooner and may need to decrease portion sizes from what they were previously accustomed to.  Provided patient with insight that subQ semaglutide has data on reducing CV risk (along with newly approved FDA indication).  Discussed possible adverse effects with metformin though unlikely with XR formulation.    2. ASCVD (previous documentation of ASCVD on CT 2023): Patient is seeing cardiology due to previous hx chest pain. Tolerating high-intensity statin and aspirin well at this time without adverse effects or s/sx bleeding/bruising. Last LDL slightly elevated at 80 mg/dL (Direct LDL) on 1/61/09 though improved from previous lipid panels. Current Regimen: atorvastatin 40 mg daily  Continue medications today without changes  3. Medication Access Patient currently has medical insurance via Medicare A/B +  supplement. However, she does not have prescription insurance at this time. Reports her generic medications are affordable at this time though cannot afford cash price of Ozempic. She may be eligible for NovoCares MAP based on estimated income.  Messaged CPhT team for assistance with completing NovoCares MAP for Ozempic 0.5 mg weekly   Follow up PCP follow up scheduled in ~2 weeks.  Follow up with clinical pharmacist ~4 weeks after visit with PCP (or sooner as needed). Future Appointments  Date Time Provider Department Center  10/18/2022 11:00 AM Sherlene Shams, MD [facility]  10/26/2022 10:30 AM Creig Hines, NP [facility] None  11/06/2022 10:30 AM Salena Saner, MD [facility] None  11/09/2022  2:00 PM Sherlene Shams, MD [facility]  12/03/2022 11:00 AM Willeen Niece, MD [facility] None  08/07/2023 10:45 AM [facility] ANNUAL WELLNESS VISIT [facility]     Loree Fee, PharmD Clinical Pharmacist [facility] [phone]

## 2022-10-05 NOTE — Telephone Encounter (Signed)
Pt would like to be called concerning a prescription for a glucose monitor

## 2022-10-05 NOTE — Assessment & Plan Note (Signed)
Discussed in details regarding lifestyle modification.  Pt is agreeable and motivated to work on decreasing her A1c levels.  Started her on Ozempic 0.25 once weekly and extended release metformin 500 mg daily.  Ozempic sample provided.  DME for continuous glucose monitor and glucometer. Medication side effect discussed. Referral placed for pharmacy medication management. Advised to monitor blood sugar regularly.  Follow-up in 2 weeks

## 2022-10-08 ENCOUNTER — Telehealth: Payer: Self-pay

## 2022-10-08 ENCOUNTER — Other Ambulatory Visit (HOSPITAL_COMMUNITY): Payer: Self-pay

## 2022-10-08 NOTE — Telephone Encounter (Signed)
-----   Message from Loree Fee sent at 10/05/2022  2:18 PM EDT ----- Can we please assist patient with NovoCares application for Ozempic 0.5 mg weekly? Thank you!

## 2022-10-08 NOTE — Telephone Encounter (Signed)
PAP: PAP application for Ozempic, [employer]) has been mailed to pt's home address on file. Will fax provider portion of application to provider's office when pt's portion is received.   Please be advise I will mailing to pt to get pt income documents to verify income

## 2022-10-08 NOTE — Telephone Encounter (Signed)
See my chart message

## 2022-10-09 NOTE — Telephone Encounter (Signed)
Pt want to be called regarding her diabetes management

## 2022-10-10 NOTE — Telephone Encounter (Signed)
See my chart message

## 2022-10-10 NOTE — Telephone Encounter (Signed)
Pt called yesterday and stated that no one has told her anything about how her diet should be. Pt would like to know what she should and should not be eating and how many calories pt should getting each day.

## 2022-10-10 NOTE — Telephone Encounter (Signed)
Patient just called and wanted to know if someone can call her about her diabetes management. And discuss that with her. Her number is [phone].

## 2022-10-10 NOTE — Telephone Encounter (Signed)
Message has been sent to Dr. Darrick Huntsman.

## 2022-10-11 ENCOUNTER — Encounter: Payer: Self-pay | Admitting: Internal Medicine

## 2022-10-12 ENCOUNTER — Other Ambulatory Visit: Payer: Self-pay | Admitting: Pharmacist

## 2022-10-12 DIAGNOSIS — E119 Type 2 diabetes mellitus without complications: Secondary | ICD-10-CM

## 2022-10-12 MED ORDER — GLUCOSE BLOOD VI STRP: ORAL_STRIP | 12 refills | Status: DC

## 2022-10-12 MED ORDER — ONETOUCH VERIO W/DEVICE KIT: PACK | 0 refills | Status: AC

## 2022-10-12 MED ORDER — LANCETS MISC: 11 refills | Status: DC

## 2022-10-12 NOTE — Progress Notes (Signed)
Per patient request, Rx sent to patient's local pharmacy for a glucometer system.   Patient is enrolled in Medicare A/B with AARP supplement.  Glucometer and supplies are covered under Part B benefit.  OneTouch Ultra 2 glucometer Kit OneTouch Ultra Test Strip #100 Lancet

## 2022-10-18 ENCOUNTER — Encounter: Payer: Self-pay | Admitting: Internal Medicine

## 2022-10-18 ENCOUNTER — Ambulatory Visit (INDEPENDENT_AMBULATORY_CARE_PROVIDER_SITE_OTHER): Payer: Medicare Other | Admitting: Internal Medicine

## 2022-10-18 VITALS — BP 80/64 | HR 70 | Ht 71.0 in | Wt 233.0 lb

## 2022-10-18 DIAGNOSIS — E861 Hypovolemia: Secondary | ICD-10-CM

## 2022-10-18 DIAGNOSIS — E119 Type 2 diabetes mellitus without complications: Secondary | ICD-10-CM

## 2022-10-18 LAB — MICROALBUMIN / CREATININE URINE RATIO
Creatinine,U: 179.6 mg/dL
Microalb Creat Ratio: 8.5 mg/g (ref 0.0–30.0)
Microalb, Ur: 15.3 mg/dL — ABNORMAL HIGH (ref 0.0–1.9)

## 2022-10-18 LAB — GLUCOSE, POCT (MANUAL RESULT ENTRY): POC Glucose: 136 mg/dL — AB (ref 70–99)

## 2022-10-18 MED ORDER — BUPROPION HCL ER (XL) 300 MG PO TB24: 300.0000 mg | ORAL_TABLET | Freq: Every day | ORAL | 2 refills | Status: DC

## 2022-10-18 NOTE — Patient Instructions (Addendum)
Stop the metoprolol .  Increase your sodium intake using dill pickles/juice.    An old fashioned glucometer is $20 out of pocket, but we are going to place a dex com for the next 2 weeks so we can check your sugars multiple times daily   Fasting should be 130 or less  and post prandials  160 or less to be WELL CONTROLLED   GET YOUR DIABETIC EYE EXAM ASAP   HERE ARE A FEW ADDITIONS TO THE DIET BELOW  Veggies Made Great ! Makes a low carb spinach & egg white frittata    Healthy Choice "low carb power bowl"  entrees and  "Steamer" entrees are are great low carb entrees that microwave in 5 minutes     Vilma Meckel now makes a frozen breakfast frittata that can be microwaved in 2 minutes and is very low carb. Frittats are similar to quiches without the crust   This is my  example of a  "Low GI"  Diet:  It will allow you to lose 4 to 8  lbs  per month if you follow it carefully.  Your goal with exercise is a minimum of 30 minutes of aerobic exercise 5 days per week (Walking does not count once it becomes easy!)    All of the foods can be found at grocery stores and in bulk at [employer].  The Atkins protein bars and shakes are available in more varieties at Target, WalMart and Lowe's Foods.     7 AM Breakfast:  Choose from the following:  Low carbohydrate Protein  Shakes (I recommend the EAS AdvantEdge "Carb Control" shakes  Or the low carb shakes by Atkins.    2.5 carbs)   Arnold's "Sandwhich Thin"toasted  w/ peanut butter (no jelly: about 20 net carbs  "Bagel Thin" with cream cheese and salmon: about 20 carbs   a scrambled egg/bacon/cheese burrito made with Mission's "carb balance" whole wheat tortilla  (about 10 net carbs )  Bj's sells a frittata (basically a quiche without the pastry crust) that is eaten cold and very convenient way to get your eggs  If you make your own shakes, avoid bananas and pineapple,  And use low carb greek yogurt or almond milk    Avoid cereal and bananas, oatmeal and  cream of wheat and grits. They are loaded with carbohydrates!   10 AM: high protein snack:  Protein bar by Atkins (the snack size, under 200 cal, usually < 6 net carbs).    A stick of cheese:  Around 1 carb,  100 cal     Dannon Light n Fit [place] Yogurt  (80 cal, 8 carbs)  Other so called "protein bars" and Greek yogurts tend to be loaded with carbohydrates.  Remember, in food advertising, the word "energy" is synonymous for " carbohydrate."  Lunch:   A Sandwich using the bread choices listed, Can use any  Eggs,  lunchmeat, grilled meat or canned tuna), avocado, regular mayo/mustard  and cheese.  A Salad using blue cheese, ranch,  Goddess or vinagrette,  Avoid taco shells, croutons or "confetti" and no "candied nuts" but regular nuts OK.   No pretzels or chips.  Pickles and miniature sweet peppers are a good low carb alternative that provide a "crunch"  The bread is the only source of carbohydrate in a sandwich and  can be decreased by trying some of these alternatives to traditional loaf bread  Joseph's makes a pita bread and a flat bread  that are 50 cal and 4 net carbs available at BJs and WalMart.  This can be toasted to use with hummous as well  Toufayan makes a low carb flatbread that's 100 cal and 9 net carbs available at [employer] and [employer] makes 2 sizes of  Low carb whole wheat tortilla  (The large one is 210 cal and 6 net carbs)  Ezekiel bread is a loaf bread sold in the frozen section of higher end grocery chains,  Very low carb  Avoid "Low fat dressings, as well as Reyne Dumas and [address] dressings They are loaded with sugar!   3 PM/ Mid day  Snack:  Consider  1 ounce of  almonds, walnuts, pistachios, pecans, peanuts,  Macadamia nuts or a nut medley.  Avoid "granola"; the dried cranberries and raisins are loaded with carbohydrates. Mixed nuts as long as there are no raisins,  cranberries or dried fruit.    Try the prosciutto/mozzarella cheese sticks by Fiorruci  In  deli /backery section   High protein      6 PM  Dinner:     Meat/fowl/fish with a green salad, and either broccoli, cauliflower, green beans, spinach, brussel sprouts or  Lima beans. DO NOT BREAD THE PROTEIN!!      There is a low carb pasta by Dreamfield's that is acceptable and tastes great: only 5 digestible carbs/serving.( All grocery stores but BJs carry it )  Try Kai Levins Angelo's chicken piccata or chicken or eggplant parm over low carb pasta.(Lowes and BJs)   Clifton Custard Sanchez's "Carnitas" (pulled pork, no sauce,  0 carbs) or his beef pot roast to make a dinner burrito (at BJ's)  Pesto over low carb pasta (bj's sells a good quality pesto in the center refrigerated section of the deli   Try satueeing  Roosvelt Harps with mushroooms  Whole wheat pasta is still full of digestible carbs and  Not as low in glycemic index as Dreamfield's.   Brown rice is still rice,  So skip the rice and noodles if you eat [place] or [place] (or at least limit to 1/2 cup)  9 PM snack :   Breyer's "low carb" fudgsicle or  ice cream bar (Carb Smart line), or  Weight Watcher's ice cream bar , or another "no sugar added" ice cream;  a serving of fresh berries/cherries with whipped cream   Cheese or DANNON'S LlGHT N FIT GREEK YOGURT  8 ounces of [place] unsweetened almond/cococunut milk    Treat yourself to a parfait made with whipped cream blueberiies, walnuts and vanilla greek yogurt  Avoid bananas, pineapple, grapes  and watermelon on a regular basis because they are high in sugar.  THINK OF THEM AS DESSERT  Remember that snack Substitutions should be less than 10 NET carbs per serving and meals < 20 carbs. Remember to subtract fiber grams to get the "net carbs."

## 2022-10-18 NOTE — Progress Notes (Signed)
Subjective:  Patient ID: Dana Burke, female    DOB: Jul 05, 1945  Age: 77 y.o. MRN: [id number]  CC: The primary encounter diagnosis was Diabetes mellitus type II, non insulin dependent (HCC). A diagnosis of Hypotension due to hypovolemia was also pertinent to this visit.   HPI Chad Raeli Rohlik presents for  Chief Complaint  Patient presents with   Medical Management of Chronic Issues    2 week follow up    1) Uncontrolled type 2 Diabetes Mellitus :  patient was seen by NP 2 weeks ago for A1c of 11.8  ( A1c was 7 in February)  and she was given rx for DME for CBG monitor and accucheck monitor.  She was also prescribed Ozempic 0.25 mg weekly (samples given ) and metformin ER .  She is tolerating the ozempic and was Told by [employer] that ozempic  would be covered .  She has not filled either rx for glucose monitors due to cost.. Diet reviewed:  she was drinking soda and sweet tea dailya and has stopped both.     Cbg 136 TODAY   2)Hypotension:  she takes metoprolol 25 mg at bedtime.  Has been feeling dizzy qam . No nocturia , dark stools.  Baseline BP has alwas been < 120   Outpatient Medications Prior to Visit  Medication Sig Dispense Refill   acetaminophen (TYLENOL) 325 MG tablet Take by mouth.     albuterol (VENTOLIN HFA) 108 (90 Base) MCG/ACT inhaler Inhale into the lungs every 6 (six) hours as needed for wheezing or shortness of breath.     aspirin EC 81 MG tablet Take 81 mg by mouth daily. Swallow whole.     atorvastatin (LIPITOR) 40 MG tablet Take 1 tablet (40 mg total) by mouth daily. 90 tablet 3   cetirizine (ZYRTEC) 10 MG tablet Take 10 mg by mouth at bedtime. As needed     Cholecalciferol (VITAMIN D-3) 1000 UNITS CAPS Take 1 capsule by mouth daily.     Docusate Sodium (COLACE PO) Take 1 capsule by mouth daily.     escitalopram (LEXAPRO) 10 MG tablet TAKE 1 TABLET BY MOUTH ONCE DAILY 90 tablet 1   esomeprazole (NEXIUM) 40 MG capsule TAKE 1 CAPSULE BY MOUTH ONCE DAILY AT  12NOON 90 capsule 3   gabapentin (NEURONTIN) 300 MG capsule TAKE 1 CAPSULE BY MOUTH 4 TIMES DAILY 360 capsule 1   Magnesium Citrate 125 MG CAPS Take 2 capsules by mouth every evening. 180 capsule 2   Melatonin 10 MG TABS Take 1 tablet by mouth.     metFORMIN (GLUCOPHAGE-XR) 500 MG 24 hr tablet Take 1 tablet (500 mg total) by mouth daily with breakfast. 30 tablet 1   metoprolol succinate (TOPROL-XL) 25 MG 24 hr tablet Take 1 tablet (25 mg total) by mouth daily. 90 tablet 3   Multiple Vitamins-Minerals (MULTIVITAMIN WITH MINERALS) tablet Take 1 tablet by mouth daily.     nitroGLYCERIN (NITROSTAT) 0.4 MG SL tablet Place 1 tablet (0.4 mg total) under the tongue every 5 (five) minutes as needed for chest pain. 50 tablet 3   buPROPion (WELLBUTRIN XL) 300 MG 24 hr tablet Take 1 tablet (300 mg total) by mouth daily. 90 tablet 2   Blood Glucose Monitoring Suppl (ONETOUCH VERIO) w/Device KIT Use as instructed to check blood glucose once daily as needed. (Patient not taking: Reported on 10/18/2022) 1 kit 0   glucose blood test strip Use as instructed to check blood glucose once daily as  needed (Patient not taking: Reported on 10/18/2022) 50 each 12   Lancets MISC Use to check blood glucose once daily as needed (Patient not taking: Reported on 10/18/2022) 100 each 11   No facility-administered medications prior to visit.    Review of Systems;  Patient denies headache, fevers, malaise, unintentional weight loss, skin rash, eye pain, sinus congestion and sinus pain, sore throat, dysphagia,  hemoptysis , cough, dyspnea, wheezing, chest pain, palpitations, orthopnea, edema, abdominal pain, nausea, melena, diarrhea, constipation, flank pain, dysuria, hematuria, urinary  Frequency, nocturia, numbness, tingling, seizures,  Focal weakness, Loss of consciousness,  Tremor, insomnia, depression, anxiety, and suicidal ideation.      Objective:  BP (!) 80/64   Pulse 70   Ht 5\' 11"  (1.803 m)   Wt 233 lb (105.7 kg)    SpO2 93%   BMI 32.50 kg/m   BP Readings from Last 3 Encounters:  10/18/22 (!) 80/64  10/04/22 118/76  07/24/22 102/66    Wt Readings from Last 3 Encounters:  10/18/22 233 lb (105.7 kg)  10/04/22 234 lb 9.6 oz (106.4 kg)  08/06/22 245 lb (111.1 kg)    Physical Exam Vitals reviewed.  Constitutional:      General: She is not in acute distress.    Appearance: Normal appearance. She is normal weight. She is not ill-appearing, toxic-appearing or diaphoretic.  HENT:     Head: Normocephalic.  Eyes:     General: No scleral icterus.       Right eye: No discharge.        Left eye: No discharge.     Conjunctiva/sclera: Conjunctivae normal.  Cardiovascular:     Rate and Rhythm: Normal rate and regular rhythm.     Heart sounds: Normal heart sounds.  Pulmonary:     Effort: Pulmonary effort is normal. No respiratory distress.     Breath sounds: Normal breath sounds.  Musculoskeletal:        General: Normal range of motion.  Skin:    General: Skin is warm and dry.  Neurological:     General: No focal deficit present.     Mental Status: She is alert and oriented to person, place, and time. Mental status is at baseline.  Psychiatric:        Mood and Affect: Mood normal.        Behavior: Behavior normal.        Thought Content: Thought content normal.        Judgment: Judgment normal.    Lab Results  Component Value Date   HGBA1C 11.8 (A) 10/04/2022   HGBA1C 11.3 (H) 09/28/2022   HGBA1C 7.2 (H) 03/09/2022    Lab Results  Component Value Date   CREATININE 0.85 09/28/2022   CREATININE 0.88 03/15/2022   CREATININE 0.84 03/09/2022    Lab Results  Component Value Date   WBC 6.4 03/15/2022   HGB 13.8 03/15/2022   HCT 42.0 03/15/2022   PLT 210 03/15/2022   GLUCOSE 254 (H) 09/28/2022   CHOL 125 09/28/2022   TRIG 184.0 (H) 09/28/2022   HDL 30.10 (L) 09/28/2022   LDLDIRECT 80.0 09/28/2022   LDLCALC 58 09/28/2022   ALT 22 09/28/2022   AST 18 09/28/2022   NA 137 09/28/2022    K 4.5 09/28/2022   CL 102 09/28/2022   CREATININE 0.85 09/28/2022   BUN 13 09/28/2022   CO2 30 09/28/2022   TSH 1.19 12/14/2020   HGBA1C 11.8 (A) 10/04/2022   MICROALBUR 15.3 (H) 10/18/2022  MM 3D SCREEN BREAST BILATERAL  Result Date: 06/12/2022 CLINICAL DATA:  Screening. EXAM: DIGITAL SCREENING BILATERAL MAMMOGRAM WITH TOMOSYNTHESIS AND CAD TECHNIQUE: Bilateral screening digital craniocaudal and mediolateral oblique mammograms were obtained. Bilateral screening digital breast tomosynthesis was performed. The images were evaluated with computer-aided detection. COMPARISON:  Previous exam(s). ACR Breast Density Category b: There are scattered areas of fibroglandular density. FINDINGS: There are no findings suspicious for malignancy. IMPRESSION: No mammographic evidence of malignancy. A result letter of this screening mammogram will be mailed directly to the patient. RECOMMENDATION: Screening mammogram in one year. (Code:SM-B-01Y) BI-RADS CATEGORY  1: Negative. Electronically Signed   By: Norva Pavlov M.D.   On: 06/12/2022 16:38    Assessment & Plan:  .Diabetes mellitus type II, non insulin dependent (HCC) Assessment & Plan: Loss of control due to dietary noncompliance .  She has lost 10 lbs uninentionally but declined use of insulin.  She is taking metformin .  Low GI  Dietary counselling given. Return in 2 weeks after wearing a CBG montior to collect data   Lab Results  Component Value Date   HGBA1C 11.8 (A) 10/04/2022     Orders: -     POCT glucose (manual entry) -     Microalbumin / creatinine urine ratio  Hypotension due to hypovolemia Assessment & Plan: May be due to hypovolemia.  Suspend metoprolol    Other orders -     buPROPion HCl ER (XL); Take 1 tablet (300 mg total) by mouth daily.  Dispense: 90 tablet; Refill: 2     I provided 40 minutes of face-to-face time during this encounter reviewing patient's last visit with me, , previous  labs and imaging  studies, counseling on  diabetes management ,  and post visit ordering to diagnostics and therapeutics .   Follow-up: Return in about 2 weeks (around 11/01/2022) for follow up diabetes.   Sherlene Shams, MD

## 2022-10-19 ENCOUNTER — Telehealth: Payer: Self-pay

## 2022-10-19 NOTE — Telephone Encounter (Signed)
Medication Samples have been provided to the patient.  Drug name: Ozempic       Strength: 0.25 mg        Qty: 2 boxes  LOT: [id number]  [id number]  Exp.Date: 11/15/2023  07/15/2023  Dosing instructions: Inject 0.25 mg into skin once weekly  The patient has been instructed regarding the correct time, dose, and frequency of taking this medication, including desired effects and most common side effects.   Isis Costanza 11:20 AM 10/19/2022

## 2022-10-20 DIAGNOSIS — I959 Hypotension, unspecified: Secondary | ICD-10-CM | POA: Insufficient documentation

## 2022-10-20 NOTE — Assessment & Plan Note (Signed)
May be due to hypovolemia.  Suspend metoprolol

## 2022-10-20 NOTE — Assessment & Plan Note (Signed)
Loss of control due to dietary noncompliance .  She has lost 10 lbs uninentionally but declined use of insulin.  She is taking metformin .  Low GI  Dietary counselling given. Return in 2 weeks after wearing a CBG montior to collect data   Lab Results  Component Value Date   HGBA1C 11.8 (A) 10/04/2022

## 2022-10-23 DIAGNOSIS — E119 Type 2 diabetes mellitus without complications: Secondary | ICD-10-CM | POA: Diagnosis not present

## 2022-10-23 DIAGNOSIS — Z961 Presence of intraocular lens: Secondary | ICD-10-CM | POA: Diagnosis not present

## 2022-10-23 DIAGNOSIS — H524 Presbyopia: Secondary | ICD-10-CM | POA: Diagnosis not present

## 2022-10-23 DIAGNOSIS — H43393 Other vitreous opacities, bilateral: Secondary | ICD-10-CM | POA: Diagnosis not present

## 2022-10-24 NOTE — Telephone Encounter (Signed)
RECEIVED PT PAGES for OZEMPIC([facility] )AND FAXED THE PROVIDER PAGES TO PROVIDER OFFICE OF TER[facility] .     PLEASE BE ADVISED

## 2022-10-25 NOTE — Progress Notes (Signed)
Dana Burke Date of Exam: 06/28/2022 Medical Rec #:  [id number]           Height:       71.0 in Accession #:    [id number]          Weight:       244.6 lb Date of  Birth:  [birth year]           BSA:          2.297 m Patient Age:    77 years            BP:           118/74 mmHg Patient Gender: F                   HR:           74 bpm. Exam Location:  [facility]  Procedure: 2D Echo, Cardiac Doppler, Color Doppler and Intracardiac Opacification Agent  Indications:    R06.02 SOB  History:        Patient has prior history of Echocardiogram examinations, most recent 09/11/2018. CAD, Signs/Symptoms:Shortness of Breath; Risk Factors:Dyslipidemia, Former Smoker and Diabetes.  Sonographer:    Quentin Ore RDMS, RVT, RDCS Referring Phys: [id number] DAVID W HARDING  IMPRESSIONS   1. Left ventricular ejection fraction, by estimation, is 60 to 65%. The left ventricle has normal function. The left ventricle has no regional wall motion abnormalities. Left ventricular diastolic parameters are consistent with Grade I diastolic dysfunction (impaired relaxation). 2. Right ventricular systolic function is normal. The right ventricular size is normal. There is normal pulmonary artery systolic pressure. The estimated right ventricular systolic pressure is 32.2 mmHg. 3. The mitral valve is normal in structure. Mild mitral valve regurgitation. No evidence of mitral stenosis. 4. The aortic valve is normal in structure. Aortic valve regurgitation is not visualized. No aortic stenosis is present. 5. The inferior vena cava is normal in size with greater than 50% respiratory variability, suggesting right atrial pressure of 3 mmHg.  FINDINGS Left Ventricle: Left ventricular ejection fraction, by estimation, is 60 to 65%. The left ventricle has normal function. The left ventricle has no regional wall motion abnormalities. Definity contrast agent was given IV to delineate the left ventricular endocardial borders. The left ventricular internal cavity size was normal in size. There is no left ventricular hypertrophy. Left ventricular diastolic parameters are consistent with Grade I diastolic  dysfunction (impaired relaxation).  Right Ventricle: The right ventricular size is normal. No increase in right ventricular wall thickness. Right ventricular systolic function is normal. There is normal pulmonary artery systolic pressure. The tricuspid regurgitant velocity is 2.61 m/s, and with an assumed right atrial pressure of 5 mmHg, the estimated right ventricular systolic pressure is 32.2 mmHg.  Left Atrium: Left atrial size was normal in size.  Right Atrium: Right atrial size was normal in size.  Pericardium: There is no evidence of pericardial effusion.  Mitral Valve: The mitral valve is normal in structure. Mild mitral valve regurgitation. No evidence of mitral valve stenosis.  Tricuspid Valve: The tricuspid valve is normal in structure. Tricuspid valve regurgitation is mild . No evidence of tricuspid stenosis.  Aortic Valve: The aortic valve is normal in structure. Aortic valve regurgitation is not visualized. No aortic stenosis is present. Aortic valve mean gradient measures 3.0 mmHg. Aortic valve peak gradient measures 5.3 mmHg. Aortic valve area, by VTI measures 3.58 cm.  Pulmonic Valve: The pulmonic valve was normal in structure. Pulmonic valve regurgitation is  Cardiology Office Note    Date:  10/26/2022  ID:  Obera, Stauch 10/14/1945, MRN [id number] PCP:  Sherlene Shams, MD  Cardiologist:  Bryan Lemma, MD  Electrophysiologist:  None   Chief Complaint: Follow up dyspnea on exertion   History of Present Illness: .    Dana Burke is a 77 y.o. 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   In 02/2022 she presented with exertional dyspnea and chest tightness that started a month prior to visit.  03/22/2022 she underwent LHC which showed angiographically normal coronary arteries.  Echocardiogram on 06/28/2022 indicated EF of 60 to 65%, LV with normal function, no RWMA, G1 DD, mild valve regurgitation. Following her LHC she had ongoing dyspnea on exertion, to assess for microvascular disease she had a CPX which showed no evidence of significant cardiopulmonary abnormality, it was noted that the most likely cause of her perceived exertional dyspnea related to patient's body habitus.  She was last seen in clinic on 07/24/2022 she had remained stable from a cardiac perspective with reported improvement breathing.  Today she presents for follow up. She reports she is doing well. Her dyspnea on exertion has improved, she denies chest pain. She has been working with her PCP to control her diabetes, recently diagnosed. She has been increasing her exercise, currently walking a mile daily and tolerating well.   Labwork independently reviewed: 09/28/2022: Sodium 137, potassium 4.5, creatinine 0.85, AST 18, ALT 22 Total cholesterol 125, triglycerides 184, HDL 30, LDL 58  ROS: .   Today she denies chest pain, lower extremity edema, fatigue, palpitations, melena, hematuria, hemoptysis, diaphoresis, weakness, presyncope, syncope, orthopnea, and PND. All other systems are reviewed and otherwise negative.  Studies Reviewed: [name]    EKG:  EKG Interpretation Date/Time:  Friday October 26 2022 10:33:23 EDT Ventricular Rate:  77 PR Interval:  166 QRS Duration:  140 QT Interval:  416 QTC Calculation: 470 R Axis:   48  Text Interpretation: Normal sinus rhythm Right bundle branch block Confirmed by Reather Littler [phone]) on 10/26/2022 5:34:12 PM    CV Studies:  Cardiac Studies & Procedures   CARDIAC CATHETERIZATION  CARDIAC CATHETERIZATION 03/22/2022  Narrative POST-CATH DIAGNOSES Angiographically normal coronary arteries-left dominant system. Normal LV gram and normal LV EDP.   RECOMMENDATIONS Consider an nonanginal etiology for chest pain and dyspnea.    Bryan Lemma, MD  Findings Coronary Findings Diagnostic  Dominance: Left  Left Main Vessel was injected. Vessel is normal in caliber. Vessel is angiographically normal.  Left Anterior Descending Vessel was injected. Vessel is normal in caliber and moderate in size. Vessel is angiographically normal.  First Diagonal Branch Vessel is small in size.  Second Diagonal Branch Vessel is small in size.  Third Diagonal Branch Vessel is small in size.  Left Circumflex Vessel was injected. Vessel is normal in caliber and large. Vessel is angiographically normal.  First Obtuse Marginal Branch Vessel is moderate in size.  Left Posterior Descending Artery Vessel is moderate in size.  First Left Posterolateral Branch Vessel is moderate in size.  Right Coronary Artery Vessel was injected. Vessel is normal in caliber and small. Vessel is angiographically normal.  Acute Marginal Branch Vessel is small in size.  Right Ventricular Branch Vessel is small in size.  Intervention  No interventions have been documented.     ECHOCARDIOGRAM  ECHOCARDIOGRAM COMPLETE 06/28/2022  Narrative ECHOCARDIOGRAM REPORT    Patient Name:  Cardiology Office Note    Date:  10/26/2022  ID:  Obera, Stauch 10/14/1945, MRN [id number] PCP:  Sherlene Shams, MD  Cardiologist:  Bryan Lemma, MD  Electrophysiologist:  None   Chief Complaint: Follow up dyspnea on exertion   History of Present Illness: .    Dana Burke is a 77 y.o. 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   In 02/2022 she presented with exertional dyspnea and chest tightness that started a month prior to visit.  03/22/2022 she underwent LHC which showed angiographically normal coronary arteries.  Echocardiogram on 06/28/2022 indicated EF of 60 to 65%, LV with normal function, no RWMA, G1 DD, mild valve regurgitation. Following her LHC she had ongoing dyspnea on exertion, to assess for microvascular disease she had a CPX which showed no evidence of significant cardiopulmonary abnormality, it was noted that the most likely cause of her perceived exertional dyspnea related to patient's body habitus.  She was last seen in clinic on 07/24/2022 she had remained stable from a cardiac perspective with reported improvement breathing.  Today she presents for follow up. She reports she is doing well. Her dyspnea on exertion has improved, she denies chest pain. She has been working with her PCP to control her diabetes, recently diagnosed. She has been increasing her exercise, currently walking a mile daily and tolerating well.   Labwork independently reviewed: 09/28/2022: Sodium 137, potassium 4.5, creatinine 0.85, AST 18, ALT 22 Total cholesterol 125, triglycerides 184, HDL 30, LDL 58  ROS: .   Today she denies chest pain, lower extremity edema, fatigue, palpitations, melena, hematuria, hemoptysis, diaphoresis, weakness, presyncope, syncope, orthopnea, and PND. All other systems are reviewed and otherwise negative.  Studies Reviewed: [name]    EKG:  EKG Interpretation Date/Time:  Friday October 26 2022 10:33:23 EDT Ventricular Rate:  77 PR Interval:  166 QRS Duration:  140 QT Interval:  416 QTC Calculation: 470 R Axis:   48  Text Interpretation: Normal sinus rhythm Right bundle branch block Confirmed by Reather Littler [phone]) on 10/26/2022 5:34:12 PM    CV Studies:  Cardiac Studies & Procedures   CARDIAC CATHETERIZATION  CARDIAC CATHETERIZATION 03/22/2022  Narrative POST-CATH DIAGNOSES Angiographically normal coronary arteries-left dominant system. Normal LV gram and normal LV EDP.   RECOMMENDATIONS Consider an nonanginal etiology for chest pain and dyspnea.    Bryan Lemma, MD  Findings Coronary Findings Diagnostic  Dominance: Left  Left Main Vessel was injected. Vessel is normal in caliber. Vessel is angiographically normal.  Left Anterior Descending Vessel was injected. Vessel is normal in caliber and moderate in size. Vessel is angiographically normal.  First Diagonal Branch Vessel is small in size.  Second Diagonal Branch Vessel is small in size.  Third Diagonal Branch Vessel is small in size.  Left Circumflex Vessel was injected. Vessel is normal in caliber and large. Vessel is angiographically normal.  First Obtuse Marginal Branch Vessel is moderate in size.  Left Posterior Descending Artery Vessel is moderate in size.  First Left Posterolateral Branch Vessel is moderate in size.  Right Coronary Artery Vessel was injected. Vessel is normal in caliber and small. Vessel is angiographically normal.  Acute Marginal Branch Vessel is small in size.  Right Ventricular Branch Vessel is small in size.  Intervention  No interventions have been documented.     ECHOCARDIOGRAM  ECHOCARDIOGRAM COMPLETE 06/28/2022  Narrative ECHOCARDIOGRAM REPORT    Patient Name:  Dana Burke Date of Exam: 06/28/2022 Medical Rec #:  [id number]           Height:       71.0 in Accession #:    [id number]          Weight:       244.6 lb Date of  Birth:  [birth year]           BSA:          2.297 m Patient Age:    77 years            BP:           118/74 mmHg Patient Gender: F                   HR:           74 bpm. Exam Location:  [facility]  Procedure: 2D Echo, Cardiac Doppler, Color Doppler and Intracardiac Opacification Agent  Indications:    R06.02 SOB  History:        Patient has prior history of Echocardiogram examinations, most recent 09/11/2018. CAD, Signs/Symptoms:Shortness of Breath; Risk Factors:Dyslipidemia, Former Smoker and Diabetes.  Sonographer:    Quentin Ore RDMS, RVT, RDCS Referring Phys: [id number] DAVID W HARDING  IMPRESSIONS   1. Left ventricular ejection fraction, by estimation, is 60 to 65%. The left ventricle has normal function. The left ventricle has no regional wall motion abnormalities. Left ventricular diastolic parameters are consistent with Grade I diastolic dysfunction (impaired relaxation). 2. Right ventricular systolic function is normal. The right ventricular size is normal. There is normal pulmonary artery systolic pressure. The estimated right ventricular systolic pressure is 32.2 mmHg. 3. The mitral valve is normal in structure. Mild mitral valve regurgitation. No evidence of mitral stenosis. 4. The aortic valve is normal in structure. Aortic valve regurgitation is not visualized. No aortic stenosis is present. 5. The inferior vena cava is normal in size with greater than 50% respiratory variability, suggesting right atrial pressure of 3 mmHg.  FINDINGS Left Ventricle: Left ventricular ejection fraction, by estimation, is 60 to 65%. The left ventricle has normal function. The left ventricle has no regional wall motion abnormalities. Definity contrast agent was given IV to delineate the left ventricular endocardial borders. The left ventricular internal cavity size was normal in size. There is no left ventricular hypertrophy. Left ventricular diastolic parameters are consistent with Grade I diastolic  dysfunction (impaired relaxation).  Right Ventricle: The right ventricular size is normal. No increase in right ventricular wall thickness. Right ventricular systolic function is normal. There is normal pulmonary artery systolic pressure. The tricuspid regurgitant velocity is 2.61 m/s, and with an assumed right atrial pressure of 5 mmHg, the estimated right ventricular systolic pressure is 32.2 mmHg.  Left Atrium: Left atrial size was normal in size.  Right Atrium: Right atrial size was normal in size.  Pericardium: There is no evidence of pericardial effusion.  Mitral Valve: The mitral valve is normal in structure. Mild mitral valve regurgitation. No evidence of mitral valve stenosis.  Tricuspid Valve: The tricuspid valve is normal in structure. Tricuspid valve regurgitation is mild . No evidence of tricuspid stenosis.  Aortic Valve: The aortic valve is normal in structure. Aortic valve regurgitation is not visualized. No aortic stenosis is present. Aortic valve mean gradient measures 3.0 mmHg. Aortic valve peak gradient measures 5.3 mmHg. Aortic valve area, by VTI measures 3.58 cm.  Pulmonic Valve: The pulmonic valve was normal in structure. Pulmonic valve regurgitation is  Dana Burke Date of Exam: 06/28/2022 Medical Rec #:  [id number]           Height:       71.0 in Accession #:    [id number]          Weight:       244.6 lb Date of  Birth:  [birth year]           BSA:          2.297 m Patient Age:    77 years            BP:           118/74 mmHg Patient Gender: F                   HR:           74 bpm. Exam Location:  [facility]  Procedure: 2D Echo, Cardiac Doppler, Color Doppler and Intracardiac Opacification Agent  Indications:    R06.02 SOB  History:        Patient has prior history of Echocardiogram examinations, most recent 09/11/2018. CAD, Signs/Symptoms:Shortness of Breath; Risk Factors:Dyslipidemia, Former Smoker and Diabetes.  Sonographer:    Quentin Ore RDMS, RVT, RDCS Referring Phys: [id number] DAVID W HARDING  IMPRESSIONS   1. Left ventricular ejection fraction, by estimation, is 60 to 65%. The left ventricle has normal function. The left ventricle has no regional wall motion abnormalities. Left ventricular diastolic parameters are consistent with Grade I diastolic dysfunction (impaired relaxation). 2. Right ventricular systolic function is normal. The right ventricular size is normal. There is normal pulmonary artery systolic pressure. The estimated right ventricular systolic pressure is 32.2 mmHg. 3. The mitral valve is normal in structure. Mild mitral valve regurgitation. No evidence of mitral stenosis. 4. The aortic valve is normal in structure. Aortic valve regurgitation is not visualized. No aortic stenosis is present. 5. The inferior vena cava is normal in size with greater than 50% respiratory variability, suggesting right atrial pressure of 3 mmHg.  FINDINGS Left Ventricle: Left ventricular ejection fraction, by estimation, is 60 to 65%. The left ventricle has normal function. The left ventricle has no regional wall motion abnormalities. Definity contrast agent was given IV to delineate the left ventricular endocardial borders. The left ventricular internal cavity size was normal in size. There is no left ventricular hypertrophy. Left ventricular diastolic parameters are consistent with Grade I diastolic  dysfunction (impaired relaxation).  Right Ventricle: The right ventricular size is normal. No increase in right ventricular wall thickness. Right ventricular systolic function is normal. There is normal pulmonary artery systolic pressure. The tricuspid regurgitant velocity is 2.61 m/s, and with an assumed right atrial pressure of 5 mmHg, the estimated right ventricular systolic pressure is 32.2 mmHg.  Left Atrium: Left atrial size was normal in size.  Right Atrium: Right atrial size was normal in size.  Pericardium: There is no evidence of pericardial effusion.  Mitral Valve: The mitral valve is normal in structure. Mild mitral valve regurgitation. No evidence of mitral valve stenosis.  Tricuspid Valve: The tricuspid valve is normal in structure. Tricuspid valve regurgitation is mild . No evidence of tricuspid stenosis.  Aortic Valve: The aortic valve is normal in structure. Aortic valve regurgitation is not visualized. No aortic stenosis is present. Aortic valve mean gradient measures 3.0 mmHg. Aortic valve peak gradient measures 5.3 mmHg. Aortic valve area, by VTI measures 3.58 cm.  Pulmonic Valve: The pulmonic valve was normal in structure. Pulmonic valve regurgitation is

## 2022-10-26 ENCOUNTER — Ambulatory Visit: Payer: Medicare Other | Attending: Nurse Practitioner | Admitting: Cardiology

## 2022-10-26 ENCOUNTER — Encounter: Payer: Self-pay | Admitting: Cardiology

## 2022-10-26 VITALS — BP 100/64 | HR 77 | Ht 71.0 in | Wt 233.0 lb

## 2022-10-26 DIAGNOSIS — E861 Hypovolemia: Secondary | ICD-10-CM | POA: Diagnosis not present

## 2022-10-26 DIAGNOSIS — R002 Palpitations: Secondary | ICD-10-CM | POA: Diagnosis not present

## 2022-10-26 DIAGNOSIS — R0609 Other forms of dyspnea: Secondary | ICD-10-CM | POA: Diagnosis not present

## 2022-10-26 DIAGNOSIS — E782 Mixed hyperlipidemia: Secondary | ICD-10-CM | POA: Diagnosis not present

## 2022-10-26 DIAGNOSIS — I2510 Atherosclerotic heart disease of native coronary artery without angina pectoris: Secondary | ICD-10-CM

## 2022-10-26 DIAGNOSIS — R918 Other nonspecific abnormal finding of lung field: Secondary | ICD-10-CM | POA: Diagnosis not present

## 2022-10-26 NOTE — Patient Instructions (Signed)
Medication Instructions:  No changes *If you need a refill on your cardiac medications before your next appointment, please call your pharmacy*   Lab Work: None ordered If you have labs (blood work) drawn today and your tests are completely normal, you will receive your results only by: MyChart Message (if you have MyChart) OR A paper copy in the mail If you have any lab test that is abnormal or we need to change your treatment, we will call you to review the results.   Testing/Procedures: None ordered   Follow-Up: At [facility], you and your health needs are our priority.  As part of our continuing mission to provide you with exceptional heart care, we have created designated Provider Care Teams.  These Care Teams include your primary Cardiologist (physician) and Advanced Practice Providers (APPs -  Physician Assistants and Nurse Practitioners) who all work together to provide you with the care you need, when you need it.  We recommend signing up for the patient portal called "MyChart".  Sign up information is provided on this After Visit Summary.  MyChart is used to connect with patients for Virtual Visits (Telemedicine).  Patients are able to view lab/test results, encounter notes, upcoming appointments, etc.  Non-urgent messages can be sent to your provider as well.   To learn more about what you can do with MyChart, go to [email or web address].    Your next appointment:   Follow up as needed  

## 2022-10-29 ENCOUNTER — Encounter: Payer: Self-pay | Admitting: Internal Medicine

## 2022-10-29 ENCOUNTER — Ambulatory Visit: Payer: Medicare Other | Admitting: Internal Medicine

## 2022-10-29 VITALS — BP 118/76 | HR 95 | Ht 71.0 in | Wt 231.4 lb

## 2022-10-29 DIAGNOSIS — I959 Hypotension, unspecified: Secondary | ICD-10-CM | POA: Diagnosis not present

## 2022-10-29 DIAGNOSIS — R0609 Other forms of dyspnea: Secondary | ICD-10-CM

## 2022-10-29 DIAGNOSIS — Z6836 Body mass index (BMI) 36.0-36.9, adult: Secondary | ICD-10-CM | POA: Diagnosis not present

## 2022-10-29 DIAGNOSIS — Z7985 Long-term (current) use of injectable non-insulin antidiabetic drugs: Secondary | ICD-10-CM

## 2022-10-29 DIAGNOSIS — E6609 Other obesity due to excess calories: Secondary | ICD-10-CM | POA: Diagnosis not present

## 2022-10-29 DIAGNOSIS — E119 Type 2 diabetes mellitus without complications: Secondary | ICD-10-CM | POA: Diagnosis not present

## 2022-10-29 DIAGNOSIS — E66812 Obesity, class 2: Secondary | ICD-10-CM | POA: Diagnosis not present

## 2022-10-29 NOTE — Progress Notes (Unsigned)
Subjective:  Patient ID: Dana Burke, female    DOB: 1945/10/15  Age: 77 y.o. MRN: [id number]  CC: {There were no encounter diagnoses. (Refresh or delete this SmartLink)}   HPI [facility] [employer] presents for  Chief Complaint  Patient presents with   Medical Management of Chronic Issues    10 day follow up on diabetes   1) type 2 DM uncontrolled:  was started on Ozempic and metformin 10 days ago and DEXCOM  CBG meter applied.  Turner Daniels have been < 130  and post prandial sugasr < 160.  Eye exam done and WNL by Lynnae Prude at the [facility]   2) hypotension:    resolved with  dc metoprolol.  Nephropathy   3) ne.w onset leg pain bilateral knees . Not involving feet. Intermittent but mostly lae afternoon or evening.  Not daily   Outpatient Medications Prior to Visit  Medication Sig Dispense Refill   acetaminophen (TYLENOL) 325 MG tablet Take by mouth.     albuterol (VENTOLIN HFA) 108 (90 Base) MCG/ACT inhaler Inhale into the lungs every 6 (six) hours as needed for wheezing or shortness of breath.     aspirin EC 81 MG tablet Take 81 mg by mouth daily. Swallow whole.     atorvastatin (LIPITOR) 40 MG tablet Take 1 tablet (40 mg total) by mouth daily. 90 tablet 3   Blood Glucose Monitoring Suppl (ONETOUCH VERIO) w/Device KIT Use as instructed to check blood glucose once daily as needed. 1 kit 0   buPROPion (WELLBUTRIN XL) 300 MG 24 hr tablet Take 1 tablet (300 mg total) by mouth daily. 90 tablet 2   cetirizine (ZYRTEC) 10 MG tablet Take 10 mg by mouth at bedtime. As needed     Cholecalciferol (VITAMIN D-3) 1000 UNITS CAPS Take 1 capsule by mouth daily.     Continuous Glucose Receiver (DEXCOM G7 RECEIVER) DEVI by Does not apply route.     Continuous Glucose Sensor (DEXCOM G7 SENSOR) MISC by Does not apply route.     Docusate Sodium (COLACE PO) Take 1 capsule by mouth daily.     escitalopram (LEXAPRO) 10 MG tablet TAKE 1 TABLET BY MOUTH ONCE DAILY 90 tablet 1   esomeprazole  (NEXIUM) 40 MG capsule TAKE 1 CAPSULE BY MOUTH ONCE DAILY AT 12NOON 90 capsule 3   gabapentin (NEURONTIN) 300 MG capsule TAKE 1 CAPSULE BY MOUTH 4 TIMES DAILY 360 capsule 1   glucose blood test strip Use as instructed to check blood glucose once daily as needed 50 each 12   Lancets MISC Use to check blood glucose once daily as needed 100 each 11   Magnesium Citrate 125 MG CAPS Take 2 capsules by mouth every evening. 180 capsule 2   Melatonin 10 MG TABS Take 1 tablet by mouth.     metFORMIN (GLUCOPHAGE-XR) 500 MG 24 hr tablet Take 1 tablet (500 mg total) by mouth daily with breakfast. 30 tablet 1   Multiple Vitamins-Minerals (MULTIVITAMIN WITH MINERALS) tablet Take 1 tablet by mouth daily.     nitroGLYCERIN (NITROSTAT) 0.4 MG SL tablet Place 1 tablet (0.4 mg total) under the tongue every 5 (five) minutes as needed for chest pain. 50 tablet 3   Semaglutide (OZEMPIC, 0.25 OR 0.5 MG/DOSE, [place]) Inject into the skin once a week.     metoprolol succinate (TOPROL-XL) 25 MG 24 hr tablet Take 1 tablet (25 mg total) by mouth daily. (Patient not taking: Reported on 10/29/2022) 90 tablet 3  No facility-administered medications prior to visit.    Review of Systems;  Patient denies headache, fevers, malaise, unintentional weight loss, skin rash, eye pain, sinus congestion and sinus pain, sore throat, dysphagia,  hemoptysis , cough, dyspnea, wheezing, chest pain, palpitations, orthopnea, edema, abdominal pain, nausea, melena, diarrhea, constipation, flank pain, dysuria, hematuria, urinary  Frequency, nocturia, numbness, tingling, seizures,  Focal weakness, Loss of consciousness,  Tremor, insomnia, depression, anxiety, and suicidal ideation.      Objective:  BP 118/76   Pulse 95   Ht 5\' 11"  (1.803 m)   Wt 231 lb 6.4 oz (105 kg)   SpO2 94%   BMI 32.27 kg/m   BP Readings from Last 3 Encounters:  10/29/22 118/76  10/26/22 100/64  10/18/22 (!) 80/64    Wt Readings from Last 3 Encounters:  10/29/22  231 lb 6.4 oz (105 kg)  10/26/22 233 lb (105.7 kg)  10/18/22 233 lb (105.7 kg)    Physical Exam  Lab Results  Component Value Date   HGBA1C 11.8 (A) 10/04/2022   HGBA1C 11.3 (H) 09/28/2022   HGBA1C 7.2 (H) 03/09/2022    Lab Results  Component Value Date   CREATININE 0.85 09/28/2022   CREATININE 0.88 03/15/2022   CREATININE 0.84 03/09/2022    Lab Results  Component Value Date   WBC 6.4 03/15/2022   HGB 13.8 03/15/2022   HCT 42.0 03/15/2022   PLT 210 03/15/2022   GLUCOSE 254 (H) 09/28/2022   CHOL 125 09/28/2022   TRIG 184.0 (H) 09/28/2022   HDL 30.10 (L) 09/28/2022   LDLDIRECT 80.0 09/28/2022   LDLCALC 58 09/28/2022   ALT 22 09/28/2022   AST 18 09/28/2022   NA 137 09/28/2022   K 4.5 09/28/2022   CL 102 09/28/2022   CREATININE 0.85 09/28/2022   BUN 13 09/28/2022   CO2 30 09/28/2022   TSH 1.19 12/14/2020   HGBA1C 11.8 (A) 10/04/2022   MICROALBUR 15.3 (H) 10/18/2022    MM 3D SCREEN BREAST BILATERAL  Result Date: 06/12/2022 CLINICAL DATA:  Screening. EXAM: DIGITAL SCREENING BILATERAL MAMMOGRAM WITH TOMOSYNTHESIS AND CAD TECHNIQUE: Bilateral screening digital craniocaudal and mediolateral oblique mammograms were obtained. Bilateral screening digital breast tomosynthesis was performed. The images were evaluated with computer-aided detection. COMPARISON:  Previous exam(s). ACR Breast Density Category b: There are scattered areas of fibroglandular density. FINDINGS: There are no findings suspicious for malignancy. IMPRESSION: No mammographic evidence of malignancy. A result letter of this screening mammogram will be mailed directly to the patient. RECOMMENDATION: Screening mammogram in one year. (Code:[id number]-01Y) BI-RADS CATEGORY  1: Negative. Electronically Signed   By: Norva Pavlov M.D.   On: 06/12/2022 16:38    Assessment & Plan:  .There are no diagnoses linked to this encounter.   I provided 30 minutes of face-to-face time during this encounter reviewing patient's  last visit with me, patient's  most recent visit with cardiology,  nephrology,  and neurology,  recent surgical and non surgical procedures, previous  labs and imaging studies, counseling on currently addressed issues,  and post visit ordering to diagnostics and therapeutics .   Follow-up: No follow-ups on file.   Sherlene Shams, MD

## 2022-10-29 NOTE — Assessment & Plan Note (Addendum)
She has been Tolerating ozempic at 0.25 mg dose . And has been advised to increase dose to 0.5 mg after 4 doses

## 2022-10-29 NOTE — Patient Instructions (Addendum)
You are doing great!  No changes to medications are needed at this time unless you want to lose more weight :  You should Complete 4 weeks of 0.25 mg dose of ozempic,  then increase dose to 0.5 mg  weekly to achieve appetite suppression    Return for labs on or after Dec 20 and office visit with me after that

## 2022-10-30 NOTE — Assessment & Plan Note (Signed)
Her BP is too soft to add an ARB  for management of microalbuminuria.  Will reassess in December

## 2022-10-30 NOTE — Assessment & Plan Note (Signed)
She has adopted a low glycemic index diet and and using ozempic

## 2022-10-30 NOTE — Assessment & Plan Note (Addendum)
Etiology likely deconditioning and obesity, given her recent unrevealing cardiac workup

## 2022-11-06 ENCOUNTER — Encounter: Payer: Self-pay | Admitting: Pulmonary Disease

## 2022-11-06 ENCOUNTER — Ambulatory Visit (INDEPENDENT_AMBULATORY_CARE_PROVIDER_SITE_OTHER): Payer: Medicare Other | Admitting: Pulmonary Disease

## 2022-11-06 VITALS — BP 106/66 | HR 76 | Temp 97.9°F | Ht 71.0 in | Wt 231.0 lb

## 2022-11-06 DIAGNOSIS — E119 Type 2 diabetes mellitus without complications: Secondary | ICD-10-CM | POA: Diagnosis not present

## 2022-11-06 DIAGNOSIS — R918 Other nonspecific abnormal finding of lung field: Secondary | ICD-10-CM

## 2022-11-06 NOTE — Progress Notes (Signed)
Subjective:    Patient ID: Dana Burke, female    DOB: 22-Feb-1945, 77 y.o.   MRN: [id number]  Patient Care Team: Sherlene Shams, MD as PCP - General (Internal Medicine) Marykay Lex, MD as PCP - Cardiology (Cardiology) Lemar Livings Merrily Pew, MD (General Surgery) Sherlene Shams, MD (Internal Medicine)  Chief Complaint  Patient presents with   Follow-up    No cough, shortness of breath or wheezing.   BACKGROUND/INTERVAL: 77 year old remote former smoker with minimal smoking history and a history as noted below who presents for follow-up on the issue of multiple lung nodules on CT chest.  She was last seen here on 08 November 2021.  HPI Discussed the use of AI scribe software for clinical note transcription with the patient, who gave verbal consent to proceed.  History of Present Illness   The patient, a 77 year old with a history of minimal smoking, multiple lung nodules, and recent diagnosis of type 2 diabetes, presents for follow-up. She reports no shortness of breath unless exerting herself, such as during her daily walks. She has been managing her diabetes with metformin and has lost over twenty pounds since her diagnosis. She also reports a family history of diabetes.  She has also been dealing with a cough, which she reports is well-controlled and not a current concern.  The patient has a history of cardiac issues, including chest fullness, and underwent a cardiac catheterization and other coronary workups. She was discharged two weeks prior to this consultation. The cardiac issues are believed to have been triggered by the onset of her diabetes, part of a metabolic syndrome.  The patient also reports having contracted COVID-19 twice since January, with a three to four-month interval between the two instances. She describes the symptoms as similar to a severe cold. She has not received a COVID-19 vaccine due to having had the virus.  She expresses little concern about  this, with her focus primarily on managing her newly diagnosed diabetes.     Review of Systems A 10 point review of systems was performed and it is as noted above otherwise negative.   Patient Active Problem List   Diagnosis Date Noted   Hypotension, unspecified 10/20/2022   Coronary artery disease, non-occlusive 03/14/2022   Diabetes mellitus treated with injections of non-insulin medication (HCC) 03/09/2022   S/P TAH-BSO 09/11/2021   Hepatic steatosis 04/04/2021   Renal cyst, acquired, left 04/04/2021   Hiatal hernia 04/04/2021   Multiple pulmonary nodules 03/14/2021   Lesion of pancreas 01/10/2021   Malignant ovarian neoplasm, unspecified laterality (HCC) 12/26/2020   Dysphonia 12/14/2020   Irregular heartbeat 12/14/2020   DOE (dyspnea on exertion) 08/13/2018   Encounter for preventive health examination 12/04/2015   Insomnia 12/04/2015   Neuropathy 06/02/2014   Major depressive disorder, single episode 05/08/2014   Screening for osteoporosis 05/08/2014   History of DVT of lower extremity 05/05/2014   Hyperlipidemia with target low density lipoprotein (LDL) cholesterol less than 100 mg/dL 03/47/4259   Medicare annual wellness visit, subsequent 05/03/2013   Special screening for malignant neoplasms, colon 09/04/2012   Obesity 07/31/2012   History of abnormal mammogram 07/31/2012   Family history of malignant melanoma 07/31/2012   GERD (gastroesophageal reflux disease)     Social History   Tobacco Use   Smoking status: Former    Current packs/day: 0.00    Average packs/day: 0.4 packs/day for 2.0 years (0.8 ttl pk-yrs)    Types: Cigarettes    Start date: 07/29/1985  Quit date: 07/30/1987    Years since quitting: 35.2   Smokeless tobacco: Never  Substance Use Topics   Alcohol use: No    No Known Allergies  Current Meds  Medication Sig   acetaminophen (TYLENOL) 325 MG tablet Take by mouth.   albuterol (VENTOLIN HFA) 108 (90 Base) MCG/ACT inhaler Inhale into the  lungs every 6 (six) hours as needed for wheezing or shortness of breath.   atorvastatin (LIPITOR) 40 MG tablet Take 1 tablet (40 mg total) by mouth daily.   Blood Glucose Monitoring Suppl (ONETOUCH VERIO) w/Device KIT Use as instructed to check blood glucose once daily as needed.   buPROPion (WELLBUTRIN XL) 300 MG 24 hr tablet Take 1 tablet (300 mg total) by mouth daily.   cetirizine (ZYRTEC) 10 MG tablet Take 10 mg by mouth at bedtime. As needed   Cholecalciferol (VITAMIN D-3) 1000 UNITS CAPS Take 1 capsule by mouth daily.   Continuous Glucose Receiver (DEXCOM G7 RECEIVER) DEVI by Does not apply route.   Continuous Glucose Sensor (DEXCOM G7 SENSOR) MISC by Does not apply route.   Docusate Sodium (COLACE PO) Take 1 capsule by mouth daily.   escitalopram (LEXAPRO) 10 MG tablet TAKE 1 TABLET BY MOUTH ONCE DAILY   esomeprazole (NEXIUM) 40 MG capsule TAKE 1 CAPSULE BY MOUTH ONCE DAILY AT 12NOON   gabapentin (NEURONTIN) 300 MG capsule TAKE 1 CAPSULE BY MOUTH 4 TIMES DAILY   glucose blood test strip Use as instructed to check blood glucose once daily as needed   Lancets MISC Use to check blood glucose once daily as needed   Magnesium Citrate 125 MG CAPS Take 2 capsules by mouth every evening.   Melatonin 10 MG TABS Take 1 tablet by mouth.   metFORMIN (GLUCOPHAGE-XR) 500 MG 24 hr tablet Take 1 tablet (500 mg total) by mouth daily with breakfast.   Multiple Vitamins-Minerals (MULTIVITAMIN WITH MINERALS) tablet Take 1 tablet by mouth daily.   nitroGLYCERIN (NITROSTAT) 0.4 MG SL tablet Place 1 tablet (0.4 mg total) under the tongue every 5 (five) minutes as needed for chest pain.   Semaglutide (OZEMPIC, 0.25 OR 0.5 MG/DOSE, [place]) Inject into the skin once a week.    Immunization History  Administered Date(s) Administered   Fluad Quad(high Dose 65+) 10/25/2021   Fluad Trivalent(High Dose 65+) 10/04/2022   Influenza, High Dose Seasonal PF 10/29/2012, 12/02/2015, 11/28/2016, 11/04/2017, 09/26/2018    Influenza,inj,Quad PF,6+ Mos 11/03/2013   Influenza-Unspecified 10/28/2014, 09/30/2020   PFIZER(Purple Top)SARS-COV-2 Vaccination 02/07/2019, 02/28/2019, 10/15/2019, 04/29/2020   Pfizer Covid-19 Vaccine Bivalent Booster 40yrs & up 09/30/2020   Pneumococcal Conjugate-13 10/29/2012   Pneumococcal Polysaccharide-23 05/05/2014   Td 08/01/1994, 06/27/2004   Tdap 11/25/2006, 05/01/2013   Zoster, Live 04/16/2006, 12/30/2007      Objective:     BP 106/66 (BP Location: Right Arm, Patient Position: Sitting, Cuff Size: Large)   Pulse 76   Temp 97.9 F (36.6 C) (Temporal)   Ht 5\' 11"  (1.803 m)   Wt 231 lb (104.8 kg)   SpO2 96%   BMI 32.22 kg/m   SpO2: 96 %  GENERAL: Obese woman, no acute distress, fully ambulatory, no conversational dyspnea. HEAD: Normocephalic, atraumatic.  EYES: Pupils equal, round, reactive to light.  No scleral icterus.  MOUTH: Dentition intact, no thrush, oral mucosa moist. NECK: Supple. No thyromegaly. Trachea midline. No JVD.  No adenopathy. PULMONARY: Good air entry bilaterally.  No adventitious sounds. CARDIOVASCULAR: S1 and S2. Regular rate and rhythm.  No rubs, murmurs or gallops  heard. ABDOMEN: Obese otherwise benign. MUSCULOSKELETAL: No joint deformity, no clubbing, no edema.  NEUROLOGIC: No overt focal deficit, no gait disturbance, speech is fluent. SKIN: Intact,warm,dry. PSYCH: Mood and behavior normal   Assessment & Plan:     ICD-10-CM   1. Multiple lung nodules  R91.8     2. Type II diabetes mellitus, well controlled (HCC)  E11.9      Assessment and Plan    Multiple Lung Nodules Stable on previous CT chest in April 2023. No new respiratory symptoms. -Schedule follow-up CT chest before patient leaves today. -Call patient with results once available.  Type 2 Diabetes Recently diagnosed and managed by Dr. Darrick Huntsman. Patient reports good control with Metformin and lifestyle modifications. -Continue current management.  Cardiac Symptoms Recent  cardiac workup due to chest fullness, discharged two weeks ago. No current symptoms. -No changes to current management.  General Health Maintenance -Continue annual flu vaccination. -Continue COVID precautions as per current guidelines.     Will call patient with the results of the CT chest when available.  Otherwise follow-up will be in a year.  She is to contact us prior to that time should any new difficulties arise.   Gailen Shelter, MD Advanced Bronchoscopy PCCM [facility]    *This note was dictated using voice recognition software/Dragon.  Despite best efforts to proofread, errors can occur which can change the meaning. Any transcriptional errors that result from this process are unintentional and may not be fully corrected at the time of dictation.

## 2022-11-06 NOTE — Patient Instructions (Signed)
VISIT SUMMARY:  During your visit, we discussed your multiple lung nodules, type 2 diabetes, and cardiac symptoms. You reported no new respiratory symptoms and your lung nodules have been stable. Your diabetes is well-managed with Metformin and lifestyle changes. You have no current cardiac symptoms following your recent cardiac workup. You also mentioned having contracted COVID-19 twice and have not received a vaccine due to this.  YOUR PLAN:  -MULTIPLE LUNG NODULES: These are small growths in your lungs that we have been monitoring. They have been stable and you have no new respiratory symptoms. We will schedule a follow-up CT scan of your chest before you leave today and will call you with the results once they are available.  -TYPE 2 DIABETES: This is a condition where your body does not use insulin properly, leading to high blood sugar levels. You have been managing this well with Metformin and lifestyle changes. Please continue your current management.  -CARDIAC SYMPTOMS: You have had some heart-related symptoms in the past, but you have no current symptoms following your recent cardiac workup. There are no changes to your current management.  INSTRUCTIONS:  Please continue your annual flu vaccination and follow current COVID-19 precautions as per guidelines. We will schedule a follow-up CT scan of your chest before you leave today and will call you with the results once they are available.

## 2022-11-09 ENCOUNTER — Ambulatory Visit: Payer: Medicare Other | Admitting: Internal Medicine

## 2022-11-09 LAB — HM DIABETES EYE EXAM

## 2022-11-12 ENCOUNTER — Encounter: Payer: Self-pay | Admitting: Internal Medicine

## 2022-11-15 ENCOUNTER — Ambulatory Visit
Admission: RE | Admit: 2022-11-15 | Discharge: 2022-11-15 | Disposition: A | Discharge status: Home / Self Care | Payer: Medicare Other | Source: Ambulatory Visit | Attending: Pulmonary Disease | Admitting: Pulmonary Disease

## 2022-11-15 DIAGNOSIS — R918 Other nonspecific abnormal finding of lung field: Secondary | ICD-10-CM | POA: Diagnosis not present

## 2022-11-15 DIAGNOSIS — I700 Atherosclerosis of aorta: Secondary | ICD-10-CM | POA: Diagnosis not present

## 2022-11-15 DIAGNOSIS — K449 Diaphragmatic hernia without obstruction or gangrene: Secondary | ICD-10-CM | POA: Diagnosis not present

## 2022-11-16 ENCOUNTER — Telehealth: Payer: Self-pay

## 2022-11-16 NOTE — Telephone Encounter (Signed)
[employer] forms signed an faxed.   Will place in red folder to be scanned into chart on Monday.

## 2022-11-19 NOTE — Telephone Encounter (Signed)
Paper work placed in red folder to be scanned into chart

## 2022-11-20 ENCOUNTER — Encounter: Payer: Self-pay | Admitting: Pulmonary Disease

## 2022-11-21 NOTE — Telephone Encounter (Signed)
Radiology has been very delayed in reporting CT scans.  I have reviewed the CT scan compared to the one from prior and on my review it does not appear that there is any change.  However, the radiologist has not interpreted it as of yet.  In any event, the findings on the CT scan which are very mild and are not likely responsible for her changes in everyday activities or behavior.  I would definitely contact primary care with these concerns.

## 2022-11-28 ENCOUNTER — Telehealth: Payer: Self-pay | Admitting: Pulmonary Disease

## 2022-11-28 NOTE — Telephone Encounter (Signed)
Patient calling back on 11/28/22 to check status of CT scans. Please call patient and advise [phone]

## 2022-11-28 NOTE — Telephone Encounter (Signed)
Spoke to patient. She is requesting CT requests. She is aware that report has not been read by radiologist at this time.  She is aware that Dr. Jayme Cloud is unavailable until 11/15.  Dr. Jayme Cloud, will you review imaging.

## 2022-11-28 NOTE — Telephone Encounter (Signed)
Patient would like results of CT scan. Called several times. Patient phone number is [phone].

## 2022-11-29 NOTE — Telephone Encounter (Signed)
Please see 11/28/2022 phone note.

## 2022-11-30 NOTE — Telephone Encounter (Signed)
I am reiterating what has been reported by me, I do not see any significant changes from prior however, the radiologist has not read it yet once this is done we will let her know final results.

## 2022-11-30 NOTE — Telephone Encounter (Signed)
Lm x1 for patient.  

## 2022-12-03 ENCOUNTER — Other Ambulatory Visit: Payer: Self-pay

## 2022-12-03 ENCOUNTER — Ambulatory Visit (INDEPENDENT_AMBULATORY_CARE_PROVIDER_SITE_OTHER): Payer: Medicare Other | Admitting: Dermatology

## 2022-12-03 DIAGNOSIS — Z872 Personal history of diseases of the skin and subcutaneous tissue: Secondary | ICD-10-CM

## 2022-12-03 DIAGNOSIS — W908XXA Exposure to other nonionizing radiation, initial encounter: Secondary | ICD-10-CM | POA: Diagnosis not present

## 2022-12-03 DIAGNOSIS — D489 Neoplasm of uncertain behavior, unspecified: Secondary | ICD-10-CM

## 2022-12-03 DIAGNOSIS — L821 Other seborrheic keratosis: Secondary | ICD-10-CM

## 2022-12-03 DIAGNOSIS — D229 Melanocytic nevi, unspecified: Secondary | ICD-10-CM

## 2022-12-03 DIAGNOSIS — L820 Inflamed seborrheic keratosis: Secondary | ICD-10-CM | POA: Diagnosis not present

## 2022-12-03 DIAGNOSIS — Z808 Family history of malignant neoplasm of other organs or systems: Secondary | ICD-10-CM | POA: Diagnosis not present

## 2022-12-03 DIAGNOSIS — L570 Actinic keratosis: Secondary | ICD-10-CM | POA: Diagnosis not present

## 2022-12-03 DIAGNOSIS — L814 Other melanin hyperpigmentation: Secondary | ICD-10-CM | POA: Diagnosis not present

## 2022-12-03 DIAGNOSIS — L578 Other skin changes due to chronic exposure to nonionizing radiation: Secondary | ICD-10-CM

## 2022-12-03 DIAGNOSIS — Z1283 Encounter for screening for malignant neoplasm of skin: Secondary | ICD-10-CM

## 2022-12-03 DIAGNOSIS — D1801 Hemangioma of skin and subcutaneous tissue: Secondary | ICD-10-CM

## 2022-12-03 DIAGNOSIS — R918 Other nonspecific abnormal finding of lung field: Secondary | ICD-10-CM

## 2022-12-03 DIAGNOSIS — D492 Neoplasm of unspecified behavior of bone, soft tissue, and skin: Secondary | ICD-10-CM | POA: Diagnosis not present

## 2022-12-03 DIAGNOSIS — L720 Epidermal cyst: Secondary | ICD-10-CM | POA: Diagnosis not present

## 2022-12-03 DIAGNOSIS — L729 Follicular cyst of the skin and subcutaneous tissue, unspecified: Secondary | ICD-10-CM

## 2022-12-03 HISTORY — DX: Actinic keratosis: L57.0

## 2022-12-03 NOTE — Patient Instructions (Addendum)

## 2022-12-03 NOTE — Telephone Encounter (Signed)
Lm x1 for patient.  

## 2022-12-03 NOTE — Telephone Encounter (Signed)
Patient is returning missed call.

## 2022-12-03 NOTE — Telephone Encounter (Signed)
Patient is aware of results and voiced her understanding.  Nothing further needed.   

## 2022-12-03 NOTE — Progress Notes (Signed)
Follow-Up Visit   Subjective  Dana Burke is a 77 y.o. female who presents for the following: Skin Cancer Screening and Full Body Skin Exam Hx of isks, hx of seb derm, hx of aks vs cnch at right ear mid helix, hx of epidermal cyst at mid back  Patient reports spot at right ear that was treated with LN2 has come back. Also some itchy spots on her L cheek.  She was diagnosed with type 2 DM this past year.  The patient presents for Total-Body Skin Exam (TBSE) for skin cancer screening and mole check. The patient has spots, moles and lesions to be evaluated, some may be new or changing and the patient may have concern these could be cancer.    The following portions of the chart were reviewed this encounter and updated as appropriate: medications, allergies, medical history  Review of Systems:  No other skin or systemic complaints except as noted in HPI or Assessment and Plan.  Objective  Well appearing patient in no apparent distress; mood and affect are within normal limits.  A full examination was performed including scalp, head, eyes, ears, nose, lips, neck, chest, axillae, abdomen, back, buttocks, bilateral upper extremities, bilateral lower extremities, hands, feet, fingers, toes, fingernails, and toenails. All findings within normal limits unless otherwise noted below.   Relevant physical exam findings are noted in the Assessment and Plan.  right mid ear helix 3 mm keratotic papule          Assessment & Plan   SKIN CANCER SCREENING PERFORMED TODAY.  ACTINIC DAMAGE - Chronic condition, secondary to cumulative UV/sun exposure - diffuse scaly erythematous macules with underlying dyspigmentation - Recommend daily broad spectrum sunscreen SPF 30+ to sun-exposed areas, reapply every 2 hours as needed.  - Staying in the shade or wearing long sleeves, sun glasses (UVA+UVB protection) and wide brim hats (4-inch brim around the entire circumference of the hat) are also  recommended for sun protection.  - Call for new or changing lesions.  LENTIGINES, SEBORRHEIC KERATOSES, HEMANGIOMAS - Benign normal skin lesions - Benign-appearing - Call for any changes  MELANOCYTIC NEVI - Tan-brown and/or pink-flesh-colored symmetric macules and papules - Benign appearing on exam today - Observation - Call clinic for new or changing moles - Recommend daily use of broad spectrum spf 30+ sunscreen to sun-exposed areas.    Epidermal cyst Right spinal mid back Exam: subcutaneous nodule with punctum   Benign-appearing. Exam most consistent with an epidermal inclusion cyst. Discussed that a cyst is a benign growth that can grow over time and sometimes get irritated or inflamed. Recommend observation if it is not bothersome. Discussed option of surgical excision to remove it if it is growing, symptomatic, or other changes noted. Please call for new or changing lesions so they can be evaluated.      FAMILY HISTORY OF SKIN CANCER What type(s):melanoma Who affected:brother, sister, and neice  Neoplasm of uncertain behavior right mid ear helix  Epidermal / dermal shaving  Lesion diameter (cm):  0.3 Informed consent: discussed and consent obtained   Patient was prepped and draped in usual sterile fashion: Area prepped with alcohol. Anesthesia: the lesion was anesthetized in a standard fashion   Anesthetic:  1% lidocaine w/ epinephrine 1-100,000 buffered w/ 8.4% NaHCO3 Instrument used: flexible razor blade   Hemostasis achieved with: pressure, aluminum chloride and electrodesiccation   Outcome: patient tolerated procedure well   Post-procedure details: wound care instructions given   Post-procedure details comment:  Ointment  and small bandage applied.   Specimen 1 - Surgical pathology Differential Diagnosis: Hypertrophic ak vs CNCH   Check Margins: No  Hypertrophic ak vs CN[facility]     Inflamed seborrheic keratosis (4) left calf x 2, left lower cheek x  2  Symptomatic, irritating, patient would like treated.  Destruction of lesion - left calf x 2, left lower cheek x 2 (4)  Destruction method: cryotherapy   Informed consent: discussed and consent obtained   Lesion destroyed using liquid nitrogen: Yes   Region frozen until ice ball extended beyond lesion: Yes   Outcome: patient tolerated procedure well with no complications   Post-procedure details: wound care instructions given   Additional details:  Prior to procedure, discussed risks of blister formation, small wound, skin dyspigmentation, or rare scar following cryotherapy. Recommend Vaseline ointment to treated areas while healing.    Return in about 1 year (around 12/03/2023) for TBSE.  I, Asher Muir, CMA, am acting as scribe for Willeen Niece, MD.   Documentation: I have reviewed the above documentation for accuracy and completeness, and I agree with the above.  Willeen Niece, MD

## 2022-12-06 LAB — SURGICAL PATHOLOGY

## 2022-12-10 ENCOUNTER — Telehealth: Payer: Self-pay

## 2022-12-10 NOTE — Telephone Encounter (Signed)
Patient informed of pathology results

## 2022-12-10 NOTE — Telephone Encounter (Signed)
Lft pt msg to call for bx result/sh

## 2022-12-10 NOTE — Telephone Encounter (Signed)
-----   Message from Willeen Niece sent at 12/10/2022 12:40 PM EST ----- 1. Skin, right mid ear helix :       ACTINIC KERATOSIS   Precancer from sun damage- will need cryotherapy if it recurs, keep sunscreen on ears when outdoors- please call patient

## 2022-12-19 ENCOUNTER — Telehealth: Payer: Self-pay

## 2022-12-19 MED ORDER — METFORMIN HCL ER 500 MG PO TB24: 500.0000 mg | ORAL_TABLET | Freq: Every day | ORAL | 1 refills | Status: DC

## 2022-12-19 NOTE — Telephone Encounter (Signed)
Spoke with pt and she stated that she is still taking the metformin and the Ozempic. Is it okay to refill the metformin in your name, it was last filled by Kara Dies, NP. Also pt would like to know if we could give her some more samples of ozempic to get her through to her next appt with you. She stated that she has been on the phone with her insurance and she doesn't think that they are going to pay for the Ozempic because pt does not have a drug coverage plan.

## 2022-12-20 NOTE — Telephone Encounter (Signed)
Patient states she is returning our call.  I read Dana Burke, CMA's message to patient.  Patient states she will come by tomorrow to pick up her Ozempic.

## 2022-12-20 NOTE — Telephone Encounter (Signed)
Medication Samples have been provided to the patient.  Drug name: Ozempic       Strength: 0.5 mg        Qty: 2 boxes  LOT: [id number]  Exp.Date: 05/14/2024  Dosing instructions: Inject 0.5 mg into skin once weekly.  The patient has been instructed regarding the correct time, dose, and frequency of taking this medication, including desired effects and most common side effects.   Faustine Tates 9:17 AM 12/20/2022

## 2022-12-20 NOTE — Telephone Encounter (Signed)
LMTCB. Need to let pt know that Dr. Darrick Huntsman has refilled her Metformin. Also need to let pt know that we have two boxes on Ozempic set aside for her to pick up.

## 2022-12-27 ENCOUNTER — Encounter: Payer: Self-pay | Admitting: Pharmacist

## 2022-12-27 NOTE — Progress Notes (Signed)
Printed and placed in quick sign folder. Would you like to increase Ozempic dose to 1 mg or have pt continue the 0.5 mg dose?

## 2022-12-27 NOTE — Progress Notes (Signed)
Manufacturer Assistance Program (MAP) Application   Manufacturer: [employer] Medication(s): Ozempic Patient Portion of Application:  10/24/22:  CPhT [facility] team assisted patient with completion. Received 10/24/22 12/27/22: PharmD submitted online enrollment  Provider Portion of Application:  10/9:  Faxed to clinic by CPhT team . Media tab shows MD signature from 11/01/22. No documentation of where MD forms were faxed/sent thereafter.  12/12: PharmD faxed to clinic  Within [employer] automated system, no record of patient exists suggesting application never processed/approved.    12/27/22:  Re-submitted patient application online for 2025, uploaded to Media tab Provider pages filled out and faxed to clinic fax for Md signature. [facility] Clinical pool notified.   Next Steps: []    PCP signature []    Upon signature(s) Application to be faxed to [employer] Fax: [phone] AND scanned into patient chart []    Fax confirmation of approval received

## 2022-12-28 NOTE — Progress Notes (Signed)
LMTCB

## 2023-01-01 NOTE — Progress Notes (Signed)
Spoke with pt and she stated that she is currently using the  0.5 mg dose. She stated that she last checked her sugar at 4 pm yesterday and it was 116, she is still having around a 2 lb weight weekly on current dose. Pt is scheduled for lab work on Monday. Should we wait and see what her A1c is before deciding on what dose to submit to [employer].

## 2023-01-01 NOTE — Progress Notes (Signed)
LMTCB

## 2023-01-02 ENCOUNTER — Telehealth: Payer: Self-pay

## 2023-01-02 DIAGNOSIS — K760 Fatty (change of) liver, not elsewhere classified: Secondary | ICD-10-CM

## 2023-01-02 DIAGNOSIS — Z7985 Long-term (current) use of injectable non-insulin antidiabetic drugs: Secondary | ICD-10-CM

## 2023-01-02 DIAGNOSIS — E785 Hyperlipidemia, unspecified: Secondary | ICD-10-CM

## 2023-01-02 NOTE — Telephone Encounter (Signed)
Copied from CRM [id number]. Topic: General - Call Back - No Documentation >> Jan 02, 2023  2:46 PM Gaetano Hawthorne wrote: Reason for CRM: Patient is returning Jessica's call - Contact Center Agent reached out to Clinic Access Line and was advised to do a CRM for a call back. Patient informed that they would give her a call back.

## 2023-01-02 NOTE — Addendum Note (Signed)
Addended by: Sherlene Shams on: 01/02/2023 10:40 AM   Modules accepted: Orders

## 2023-01-02 NOTE — Telephone Encounter (Signed)
Copied from CRM [id number]. Topic: Clinical - Medication Question >> Jan 02, 2023  8:40 AM Elizebeth Brooking wrote: Reason for CRM: Patient wants Dr.[facility]ull[facility] and Nurse to know she would like for her ozempic medication dosage to be at 1 MG for her next dosage >> Jan 02, 2023  8:52 AM CMA Elijio Miles wrote: Is it okay to increase pt's ozempic to 1 mg?

## 2023-01-02 NOTE — Telephone Encounter (Signed)
LMTCB

## 2023-01-02 NOTE — Progress Notes (Signed)
See other telephone encounter.

## 2023-01-02 NOTE — Telephone Encounter (Signed)
Spoke with pt and she is aware not to increase the Ozempic to 1 mg. Pt has also been scheduled for a lab appt on 01/07/2023.

## 2023-01-07 ENCOUNTER — Other Ambulatory Visit (INDEPENDENT_AMBULATORY_CARE_PROVIDER_SITE_OTHER): Payer: Medicare Other

## 2023-01-07 DIAGNOSIS — E119 Type 2 diabetes mellitus without complications: Secondary | ICD-10-CM

## 2023-01-07 DIAGNOSIS — Z7985 Long-term (current) use of injectable non-insulin antidiabetic drugs: Secondary | ICD-10-CM

## 2023-01-07 DIAGNOSIS — E785 Hyperlipidemia, unspecified: Secondary | ICD-10-CM | POA: Diagnosis not present

## 2023-01-07 DIAGNOSIS — K760 Fatty (change of) liver, not elsewhere classified: Secondary | ICD-10-CM | POA: Diagnosis not present

## 2023-01-07 LAB — LIPID PANEL
Cholesterol: 125 mg/dL (ref 0–200)
HDL: 38.2 mg/dL — ABNORMAL LOW (ref >39.00)
LDL Cholesterol: 67 mg/dL (ref 0–99)
NonHDL: 87.07
Total CHOL/HDL Ratio: 3
Triglycerides: 101 mg/dL (ref 0.0–149.0)
VLDL: 20.2 mg/dL (ref 0.0–40.0)

## 2023-01-07 LAB — MICROALBUMIN / CREATININE URINE RATIO
Creatinine,U: 111.6 mg/dL
Microalb Creat Ratio: 1.1 mg/g (ref 0.0–30.0)
Microalb, Ur: 1.2 mg/dL (ref 0.0–1.9)

## 2023-01-07 LAB — COMPREHENSIVE METABOLIC PANEL
ALT: 127 U/L — ABNORMAL HIGH (ref 0–35)
AST: 116 U/L — ABNORMAL HIGH (ref 0–37)
Albumin: 4 g/dL (ref 3.5–5.2)
Alkaline Phosphatase: 517 U/L — ABNORMAL HIGH (ref 39–117)
BUN: 14 mg/dL (ref 6–23)
CO2: 31 meq/L (ref 19–32)
Calcium: 10.5 mg/dL (ref 8.4–10.5)
Chloride: 103 meq/L (ref 96–112)
Creatinine, Ser: 0.87 mg/dL (ref 0.40–1.20)
GFR: 63.22 mL/min (ref >60.00)
Glucose, Bld: 98 mg/dL (ref 70–99)
Potassium: 4.4 meq/L (ref 3.5–5.1)
Sodium: 141 meq/L (ref 135–145)
Total Bilirubin: 0.6 mg/dL (ref 0.2–1.2)
Total Protein: 6.7 g/dL (ref 6.0–8.3)

## 2023-01-07 LAB — LIPASE: Lipase: 35 U/L (ref 11.0–59.0)

## 2023-01-07 LAB — LDL CHOLESTEROL, DIRECT: Direct LDL: 65 mg/dL

## 2023-01-07 LAB — HEMOGLOBIN A1C: Hgb A1c MFr Bld: 6.2 % (ref 4.6–6.5)

## 2023-01-10 ENCOUNTER — Telehealth: Payer: Self-pay | Admitting: Internal Medicine

## 2023-01-10 ENCOUNTER — Telehealth: Payer: Self-pay

## 2023-01-10 ENCOUNTER — Other Ambulatory Visit: Payer: Self-pay | Admitting: Internal Medicine

## 2023-01-10 DIAGNOSIS — K8040 Calculus of bile duct with cholecystitis, unspecified, without obstruction: Secondary | ICD-10-CM

## 2023-01-10 MED ORDER — SEMAGLUTIDE(0.25 OR 0.5MG/DOS) 2 MG/3ML ~~LOC~~ SOPN: 0.5000 mg | PEN_INJECTOR | SUBCUTANEOUS | 2 refills | Status: DC

## 2023-01-10 MED ORDER — SEMAGLUTIDE (1 MG/DOSE) 4 MG/3ML ~~LOC~~ SOPN: 1.0000 mg | PEN_INJECTOR | SUBCUTANEOUS | 2 refills | Status: DC

## 2023-01-10 NOTE — Telephone Encounter (Signed)
Copied from CRM [id number]. Topic: Clinical - Medication Question >> Jan 02, 2023  8:40 AM Elizebeth Brooking wrote: Reason for CRM: Patient wants Dr.Tullo and Nurse to know she would like for her ozempic medication dosage to be at 1 MG for her next dosage

## 2023-01-10 NOTE — Telephone Encounter (Signed)
Lft pt vm to call ofc to sch mri. thanks

## 2023-01-10 NOTE — Telephone Encounter (Signed)
LMTCB

## 2023-01-10 NOTE — Telephone Encounter (Signed)
Pt is receiving her Ozempic through pt assistance. Was waiting on lab results to make sure pancreas was fine before submitting the application. Application will now be submitted for the 0.5 mg dose.

## 2023-01-10 NOTE — Telephone Encounter (Signed)
Medication has been canceled at pharmacy.

## 2023-01-10 NOTE — Telephone Encounter (Signed)
Per previous message you advised that pt does not need to increase her dose to 1 mg. Pt had lab work done on 01/07/2023 and her liver enzymes have increased tremendously. Does pt need to continue Ozempic 0.5 mg?

## 2023-01-10 NOTE — Addendum Note (Signed)
Addended by: Sherlene Shams on: 01/10/2023 10:06 AM   Modules accepted: Orders

## 2023-01-10 NOTE — Telephone Encounter (Signed)
CORRECTION:  IT WILL NEED TO BE AN MRCP (MRI) BECAUSE SHE HAS ALREADY HAD HER GALBLADDER REMOVED AND THE LIVER ENZYMES SUGGEST A RETAINED STONE IN THE BILE DUCT, WHICH WILL REQUIRE A DIFFERENT IMAGE

## 2023-01-11 ENCOUNTER — Telehealth: Payer: Self-pay | Admitting: Internal Medicine

## 2023-01-11 ENCOUNTER — Telehealth: Payer: Self-pay

## 2023-01-11 NOTE — Telephone Encounter (Signed)
Pt called back today in regards to the lab results and MRI that has been ordered. I gave pt her results and explained why the MRI was being done.pt gave a verbal understanding. Pt was not at home when she was called to schedule the MRI so I let pt know that I would find out about that. I reached out to Rasheedah our referral coordinator and scheduler and she stated that she would give pt a call back.

## 2023-01-11 NOTE — Telephone Encounter (Signed)
Please see telephone encounter from 01/10/2023

## 2023-01-11 NOTE — Telephone Encounter (Signed)
Lft pt vm to call ofc to sch mri. thanks

## 2023-01-11 NOTE — Telephone Encounter (Signed)
Copied from CRM [id number]. Topic: Clinical - Lab/Test Results >> Jan 11, 2023  2:33 PM Sim Boast F wrote: Reason for CRM: Patient returning call regarding her lab results and to schedule MRI.

## 2023-01-14 ENCOUNTER — Telehealth: Payer: Self-pay

## 2023-01-14 DIAGNOSIS — R748 Abnormal levels of other serum enzymes: Secondary | ICD-10-CM

## 2023-01-14 DIAGNOSIS — Z9049 Acquired absence of other specified parts of digestive tract: Secondary | ICD-10-CM

## 2023-01-14 NOTE — Addendum Note (Signed)
Addended by: Sherlene Shams on: 01/14/2023 02:44 PM   Modules accepted: Orders

## 2023-01-14 NOTE — Telephone Encounter (Signed)
The MRI dept at [facility] is aware that the order has been corrected.

## 2023-01-14 NOTE — Telephone Encounter (Signed)
Copied from CRM [id number]. Topic: Clinical - Request for Lab/Test Order >> Jan 14, 2023 12:53 PM Chantha C wrote: Reason for CRM: Laurena Spies from MRI dept [facility] [phone], pt is schedule for tomorrow, they need an order correct for MRI with and without contrast or MRI without contrast, it cannot be MRI with contrast. Pls update order and fax #[phone]

## 2023-01-15 ENCOUNTER — Other Ambulatory Visit: Payer: Self-pay | Admitting: Internal Medicine

## 2023-01-15 ENCOUNTER — Ambulatory Visit: Admission: RE | Admit: 2023-01-15 | Payer: Medicare Other | Source: Ambulatory Visit

## 2023-01-15 ENCOUNTER — Ambulatory Visit
Admission: RE | Admit: 2023-01-15 | Discharge: 2023-01-15 | Disposition: A | Discharge status: Home / Self Care | Payer: Medicare Other | Source: Ambulatory Visit | Attending: Internal Medicine | Admitting: Internal Medicine

## 2023-01-15 DIAGNOSIS — Z9049 Acquired absence of other specified parts of digestive tract: Secondary | ICD-10-CM | POA: Insufficient documentation

## 2023-01-15 DIAGNOSIS — R1011 Right upper quadrant pain: Secondary | ICD-10-CM | POA: Diagnosis not present

## 2023-01-15 DIAGNOSIS — R748 Abnormal levels of other serum enzymes: Secondary | ICD-10-CM

## 2023-01-15 MED ORDER — GADOBUTROL 1 MMOL/ML IV SOLN
7.5000 mL | Freq: Once | INTRAVENOUS | Status: AC | PRN
  Administered 2023-01-15: 7.5 mL via INTRAVENOUS

## 2023-01-18 ENCOUNTER — Ambulatory Visit: Payer: Medicare Other | Admitting: Internal Medicine

## 2023-01-21 ENCOUNTER — Encounter: Payer: Self-pay | Admitting: Internal Medicine

## 2023-01-21 ENCOUNTER — Ambulatory Visit: Payer: Medicare Other | Admitting: Internal Medicine

## 2023-01-21 ENCOUNTER — Telehealth: Payer: Self-pay

## 2023-01-21 VITALS — BP 106/68 | HR 88 | Ht 71.0 in | Wt 212.2 lb

## 2023-01-21 DIAGNOSIS — K449 Diaphragmatic hernia without obstruction or gangrene: Secondary | ICD-10-CM | POA: Diagnosis not present

## 2023-01-21 DIAGNOSIS — R918 Other nonspecific abnormal finding of lung field: Secondary | ICD-10-CM

## 2023-01-21 DIAGNOSIS — Z7985 Long-term (current) use of injectable non-insulin antidiabetic drugs: Secondary | ICD-10-CM

## 2023-01-21 DIAGNOSIS — E119 Type 2 diabetes mellitus without complications: Secondary | ICD-10-CM

## 2023-01-21 DIAGNOSIS — E785 Hyperlipidemia, unspecified: Secondary | ICD-10-CM | POA: Diagnosis not present

## 2023-01-21 DIAGNOSIS — K759 Inflammatory liver disease, unspecified: Secondary | ICD-10-CM | POA: Diagnosis not present

## 2023-01-21 DIAGNOSIS — R7303 Prediabetes: Secondary | ICD-10-CM

## 2023-01-21 DIAGNOSIS — N838 Other noninflammatory disorders of ovary, fallopian tube and broad ligament: Secondary | ICD-10-CM | POA: Diagnosis not present

## 2023-01-21 DIAGNOSIS — Z7984 Long term (current) use of oral hypoglycemic drugs: Secondary | ICD-10-CM

## 2023-01-21 DIAGNOSIS — K869 Disease of pancreas, unspecified: Secondary | ICD-10-CM | POA: Diagnosis not present

## 2023-01-21 DIAGNOSIS — K760 Fatty (change of) liver, not elsewhere classified: Secondary | ICD-10-CM

## 2023-01-21 DIAGNOSIS — R7401 Elevation of levels of liver transaminase levels: Secondary | ICD-10-CM

## 2023-01-21 DIAGNOSIS — R748 Abnormal levels of other serum enzymes: Secondary | ICD-10-CM | POA: Diagnosis not present

## 2023-01-21 MED ORDER — ESCITALOPRAM OXALATE 10 MG PO TABS: 10.0000 mg | ORAL_TABLET | Freq: Every day | ORAL | 1 refills | Status: DC

## 2023-01-21 MED ORDER — GABAPENTIN 300 MG PO CAPS: ORAL_CAPSULE | ORAL | 1 refills | Status: DC

## 2023-01-21 MED ORDER — ATORVASTATIN CALCIUM 40 MG PO TABS: 40.0000 mg | ORAL_TABLET | Freq: Every day | ORAL | 3 refills | Status: DC

## 2023-01-21 NOTE — Assessment & Plan Note (Signed)
 Managed with continued surveillance due to one in the RU Lthat is slowy enlarging . Repeat CT in October showed stability.

## 2023-01-21 NOTE — Assessment & Plan Note (Addendum)
 A1c is at goal on ozempic and metformin.  Stopping metformin due to persistent nausea

## 2023-01-21 NOTE — Assessment & Plan Note (Addendum)
 LDL < 70 ,   ut will need to dc  lipitor;  ias MRCP is negative for choledocholithiasis  and repeat LFTs weekly  Lab Results  Component Value Date   CHOL 125 01/07/2023   HDL 38.20 (L) 01/07/2023   LDLCALC 67 01/07/2023   LDLDIRECT 65.0 01/07/2023   TRIG 101.0 01/07/2023   CHOLHDL 3 01/07/2023

## 2023-01-21 NOTE — Patient Instructions (Signed)
 PATIENT ASSISTANCE IS IN THE PROCESS OF OBTAINING OZEMPIC  FOR YOU.  You will be contacted when your Ozempic  supply arrives .  The sample we gave you today should last 4 weeks if you use the 0.5 mg dose and 6 weeks if you use the 0.25 g dose   STOP THE METFORMIN  .  IF THE NAUSEA DOES NOT RESOLVE,  WE WILL SUSPEND OZEMPIC  AND OBTAIN A GASTRIC EMPTYING STUDY    CONTINUE LIPITOR AND NEXIUM     TRY ANY OTC MAGNESIUM  AS A SUBSTITUTE FOR THE PERICOLACE

## 2023-01-21 NOTE — Assessment & Plan Note (Addendum)
 Not seen on recent MRCPv  Continue Low GI diet,  Weight loss,  Vaccination status against Hep a /B  still needed (ab titers were negative June 2023)

## 2023-01-21 NOTE — Assessment & Plan Note (Signed)
Patient is s/p TAH/BSO.  All path reports benign .  Advised not to use HRT to manage menopause symptoms

## 2023-01-21 NOTE — Telephone Encounter (Signed)
 Medication Samples have been provided to the patient.  Drug name: Ozempic        Strength: 0.25/0.5 mg        Qty: 1 box  LOT: [id number]  Exp.Date: 03/14/2024  Dosing instructions: Inject 0.5 mg into skin once weekly.   The patient has been instructed regarding the correct time, dose, and frequency of taking this medication, including desired effects and most common side effects.   Dana Burke 10:31 AM 01/21/2023

## 2023-01-21 NOTE — Progress Notes (Addendum)
 Subjective:  Patient ID: Dana Burke, female    DOB: [birth year]  Age: 78 y.o. MRN: [id number]  CC: The primary encounter diagnosis was Prediabetes. Diagnoses of Hyperlipidemia with target low density lipoprotein (LDL) cholesterol less than 100 mg/dL, Lesion of pancreas, Multiple pulmonary nodules determined by computed tomography of lung, Ovarian mass, Elevated liver enzymes, Hepatic steatosis, Transaminitis, Hepatitis, Hiatal hernia, and Diabetes mellitus treated with injections of non-insulin medication (HCC) were also pertinent to this visit.   HPI Audyn Aprill Banko presents for  Chief Complaint  Patient presents with   Medical Management of Chronic Issues   1) TYPE 2  DM with obesity :  now under control with GLP   Ozempic  at 0.5 mg dose ,   using pericolace and stool softener to manage durg related constipation.  .  Bowels moving every other day, still hard but not straining   Lab Results  Component Value Date   HGBA1C 6.2 01/07/2023    2) elevated LFTs:   s/p cholecystectomy > 20 yrs ago.  Question of pancreatic neoplasm suggested by prior CT 2021;  2023 MRI showed no change.  has had occasional mild RUQ pain  unrelated to eating.  Having daily  persistent nausea which she has attributed to metformin  . However, her daughter has confirmed that she has not been taking metformin  for months. Her statin was changed from simvastatin  to atorvastatin  in February 2024    Outpatient Medications Prior to Visit  Medication Sig Dispense Refill   acetaminophen  (TYLENOL ) 325 MG tablet Take by mouth.     albuterol  (VENTOLIN  HFA) 108 (90 Base) MCG/ACT inhaler Inhale into the lungs every 6 (six) hours as needed for wheezing or shortness of breath.     Blood Glucose Monitoring Suppl (ONETOUCH VERIO) w/Device KIT Use as instructed to check blood glucose once daily as needed. 1 kit 0   buPROPion  (WELLBUTRIN  XL) 300 MG 24 hr tablet Take 1 tablet (300 mg total) by mouth daily. 90 tablet 2    cetirizine (ZYRTEC) 10 MG tablet Take 10 mg by mouth at bedtime. As needed     Cholecalciferol (VITAMIN D -3) 1000 UNITS CAPS Take 1 capsule by mouth daily.     Docusate Sodium  (COLACE PO) Take 1 capsule by mouth daily.     esomeprazole  (NEXIUM ) 40 MG capsule TAKE 1 CAPSULE BY MOUTH ONCE DAILY AT 12NOON 90 capsule 3   glucose blood test strip Use as instructed to check blood glucose once daily as needed 50 each 12   Lancets MISC Use to check blood glucose once daily as needed 100 each 11   Magnesium  Citrate 125 MG CAPS Take 2 capsules by mouth every evening. 180 capsule 2   Melatonin 10 MG TABS Take 1 tablet by mouth.     Multiple Vitamins-Minerals (MULTIVITAMIN WITH MINERALS) tablet Take 1 tablet by mouth daily.     nitroGLYCERIN  (NITROSTAT ) 0.4 MG SL tablet Place 1 tablet (0.4 mg total) under the tongue every 5 (five) minutes as needed for chest pain. 50 tablet 3   Semaglutide ,0.25 or 0.5MG /DOS, 2 MG/3ML SOPN Inject 0.5 mg into the skin once a week. 3 mL 2   atorvastatin  (LIPITOR) 40 MG tablet Take 1 tablet (40 mg total) by mouth daily. 90 tablet 3   escitalopram  (LEXAPRO ) 10 MG tablet TAKE 1 TABLET BY MOUTH ONCE DAILY 90 tablet 1   gabapentin  (NEURONTIN ) 300 MG capsule TAKE 1 CAPSULE BY MOUTH 4 TIMES DAILY 360 capsule 1   metFORMIN  (GLUCOPHAGE -XR)  500 MG 24 hr tablet Take 1 tablet (500 mg total) by mouth daily with breakfast. 90 tablet 1   Continuous Glucose Receiver (DEXCOM G7 RECEIVER) DEVI by Does not apply route. (Patient not taking: Reported on 01/21/2023)     Continuous Glucose Sensor (DEXCOM G7 SENSOR) MISC by Does not apply route. (Patient not taking: Reported on 01/21/2023)     No facility-administered medications prior to visit.    Review of Systems;  Patient denies headache, fevers, malaise, unintentional weight loss, skin rash, eye pain, sinus congestion and sinus pain, sore throat, dysphagia,  hemoptysis , cough, dyspnea, wheezing, chest pain, palpitations, orthopnea, edema,  abdominal pain, nausea, melena, diarrhea, constipation, flank pain, dysuria, hematuria, urinary  Frequency, nocturia, numbness, tingling, seizures,  Focal weakness, Loss of consciousness,  Tremor, insomnia, depression, anxiety, and suicidal ideation.      Objective:  BP 106/68   Pulse 88   Ht 5' 11 (1.803 m)   Wt 212 lb 3.2 oz (96.3 kg)   SpO2 99%   BMI 29.60 kg/m   BP Readings from Last 3 Encounters:  01/21/23 106/68  11/06/22 106/66  10/29/22 118/76    Wt Readings from Last 3 Encounters:  01/21/23 212 lb 3.2 oz (96.3 kg)  11/06/22 231 lb (104.8 kg)  10/29/22 231 lb 6.4 oz (105 kg)    Physical Exam Vitals reviewed.  Constitutional:      General: She is not in acute distress.    Appearance: Normal appearance. She is normal weight. She is not ill-appearing, toxic-appearing or diaphoretic.  HENT:     Head: Normocephalic.  Eyes:     General: No scleral icterus.       Right eye: No discharge.        Left eye: No discharge.     Conjunctiva/sclera: Conjunctivae normal.  Cardiovascular:     Rate and Rhythm: Normal rate and regular rhythm.     Heart sounds: Normal heart sounds.  Pulmonary:     Effort: Pulmonary effort is normal. No respiratory distress.     Breath sounds: Normal breath sounds.  Musculoskeletal:        General: Normal range of motion.  Skin:    General: Skin is warm and dry.  Neurological:     General: No focal deficit present.     Mental Status: She is alert and oriented to person, place, and time. Mental status is at baseline.  Psychiatric:        Mood and Affect: Mood normal.        Behavior: Behavior normal.        Thought Content: Thought content normal.        Judgment: Judgment normal.    Lab Results  Component Value Date   HGBA1C 6.2 01/07/2023   HGBA1C 11.8 (A) 10/04/2022   HGBA1C 11.3 (H) 09/28/2022    Lab Results  Component Value Date   CREATININE 0.87 01/07/2023   CREATININE 0.85 09/28/2022   CREATININE 0.88 03/15/2022    Lab  Results  Component Value Date   WBC 6.4 03/15/2022   HGB 13.8 03/15/2022   HCT 42.0 03/15/2022   PLT 210 03/15/2022   GLUCOSE 98 01/07/2023   CHOL 125 01/07/2023   TRIG 101.0 01/07/2023   HDL 38.20 (L) 01/07/2023   LDLDIRECT 65.0 01/07/2023   LDLCALC 67 01/07/2023   ALT 127 (H) 01/07/2023   AST 116 (H) 01/07/2023   NA 141 01/07/2023   K 4.4 01/07/2023   CL 103 01/07/2023  CREATININE 0.87 01/07/2023   BUN 14 01/07/2023   CO2 31 01/07/2023   TSH 1.19 12/14/2020   HGBA1C 6.2 01/07/2023   MICROALBUR 1.2 01/07/2023    No results found.  Assessment & Plan:  .Prediabetes -     Hemoglobin A1c; Future -     Comprehensive metabolic panel; Future  Hyperlipidemia with target low density lipoprotein (LDL) cholesterol less than 100 mg/dL Assessment & Plan: LDL < 70 ,   ut will need to dc  lipitor;  ias MRCP is negative for choledocholithiasis  and repeat LFTs weekly  Lab Results  Component Value Date   CHOL 125 01/07/2023   HDL 38.20 (L) 01/07/2023   LDLCALC 67 01/07/2023   LDLDIRECT 65.0 01/07/2023   TRIG 101.0 01/07/2023   CHOLHDL 3 01/07/2023     Orders: -     Lipid panel; Future -     LDL cholesterol, direct; Future  Lesion of pancreas Assessment & Plan: MRCP WAS DONE Jan 15 2023 after LFTs were elevated ; lipase was normal  MRCP results are pending   Lab Results  Component Value Date   ALT 127 (H) 01/07/2023   AST 116 (H) 01/07/2023   ALKPHOS 517 (H) 01/07/2023   BILITOT 0.6 01/07/2023   Lab Results  Component Value Date   LIPASE 35.0 01/07/2023     Orders: -     Lipase; Future  Multiple pulmonary nodules determined by computed tomography of lung Assessment & Plan: Managed with continued surveillance due to one in the RU Lthat is slowy enlarging . Repeat CT in October showed stability.    Ovarian mass Assessment & Plan: Patient is s/p TAH/BSO.  All path reports benign .  Advised not to use HRT to manage menopause symptoms    Elevated liver  enzymes Assessment & Plan: Etiology concerning for statin induced transaminitis  given normal MRCP   Lab Results  Component Value Date   ALT 127 (H) 01/07/2023   AST 116 (H) 01/07/2023   ALKPHOS 517 (H) 01/07/2023   BILITOT 0.6 01/07/2023     Orders: -     Comprehensive metabolic panel; Future  Hepatic steatosis Assessment & Plan: Not seen on recent MRCPv  Continue Low GI diet,  Weight loss,  Vaccination status against Hep a /B  still needed (ab titers were negative June 2023)   Transaminitis -     Hepatitis panel, acute; Future  Hepatitis -     Hepatitis panel, acute; Future  Hiatal hernia Assessment & Plan: GERD aggravated by metformin .  Continue PPI,  stop metformin     Diabetes mellitus treated with injections of non-insulin medication (HCC) Assessment & Plan: A1c is at goal on ozempic  and metformin .  Stopping metformin  due to persistent nausea    Other orders -     Escitalopram  Oxalate; Take 1 tablet (10 mg total) by mouth daily.  Dispense: 90 tablet; Refill: 1 -     Gabapentin ; TAKE 1 CAPSULE BY MOUTH 4 TIMES DAILY  Dispense: 360 capsule; Refill: 1     I provided 30 minutes of face-to-face time during this encounter reviewing patient's last visit with me, patient's  most recent visit with pulmonology,  gastroenterology, and cardiology,   recent surgical and non surgical procedures, previous  labs and imaging studies, counseling on currently addressed issues,  and post visit ordering to diagnostics and therapeutics .   Follow-up: Return in about 3 months (around 04/22/2023) for follow up diabetes.   Verneita LITTIE Kettering, MD

## 2023-01-21 NOTE — Assessment & Plan Note (Addendum)
 Etiology concerning for statin induced transaminitis  given normal MRCP   Lab Results  Component Value Date   ALT 127 (H) 01/07/2023   AST 116 (H) 01/07/2023   ALKPHOS 517 (H) 01/07/2023   BILITOT 0.6 01/07/2023

## 2023-01-21 NOTE — Addendum Note (Signed)
 Addended by: Sherlene Shams on: 01/21/2023 08:15 PM   Modules accepted: Orders

## 2023-01-21 NOTE — Assessment & Plan Note (Signed)
 GERD aggravated by metformin.  Continue PPI,  stop metformin

## 2023-01-21 NOTE — Assessment & Plan Note (Addendum)
 MRCP WAS DONE Jan 15 2023 after LFTs were elevated ; lipase was normal  MRCP results are pending   Lab Results  Component Value Date   ALT 127 (H) 01/07/2023   AST 116 (H) 01/07/2023   ALKPHOS 517 (H) 01/07/2023   BILITOT 0.6 01/07/2023   Lab Results  Component Value Date   LIPASE 35.0 01/07/2023

## 2023-01-29 ENCOUNTER — Other Ambulatory Visit (INDEPENDENT_AMBULATORY_CARE_PROVIDER_SITE_OTHER): Payer: Medicare Other

## 2023-01-29 DIAGNOSIS — R7401 Elevation of levels of liver transaminase levels: Secondary | ICD-10-CM

## 2023-01-29 DIAGNOSIS — K759 Inflammatory liver disease, unspecified: Secondary | ICD-10-CM | POA: Diagnosis not present

## 2023-01-30 LAB — HEPATITIS PANEL, ACUTE
Hep A IgM: NONREACTIVE
Hep B C IgM: NONREACTIVE
Hepatitis B Surface Ag: NONREACTIVE
Hepatitis C Ab: NONREACTIVE

## 2023-01-30 NOTE — Telephone Encounter (Signed)
 PAP: Patient assistance application Ozempic  for has been approved by PAP Companies: [employer] from 01/16/2023 to 01/15/2024. Medication should be delivered to PAP Delivery: Provider's office For further shipping updates, please contact [employer] at [phone] Pt ID is: NO ID  PLEASE BE ADVISED

## 2023-03-19 ENCOUNTER — Telehealth: Payer: Self-pay

## 2023-03-19 NOTE — Telephone Encounter (Signed)
 Spoke with pt to let her know that we have received her patient assistance medication and it is ready for pick up.    Ozempic 0.25/0.5 mg: 4 boxes

## 2023-03-21 ENCOUNTER — Telehealth: Payer: Self-pay

## 2023-03-21 NOTE — Telephone Encounter (Signed)
 Pt came in to pick up pt assistance medication @ 12:10 pm Ozempic (4boxes)

## 2023-04-22 ENCOUNTER — Other Ambulatory Visit (INDEPENDENT_AMBULATORY_CARE_PROVIDER_SITE_OTHER): Payer: Medicare Other

## 2023-04-22 DIAGNOSIS — R748 Abnormal levels of other serum enzymes: Secondary | ICD-10-CM | POA: Diagnosis not present

## 2023-04-22 DIAGNOSIS — E785 Hyperlipidemia, unspecified: Secondary | ICD-10-CM

## 2023-04-22 DIAGNOSIS — R7303 Prediabetes: Secondary | ICD-10-CM

## 2023-04-22 DIAGNOSIS — K869 Disease of pancreas, unspecified: Secondary | ICD-10-CM

## 2023-04-22 LAB — COMPREHENSIVE METABOLIC PANEL WITH GFR
ALT: 14 U/L (ref 0–35)
AST: 15 U/L (ref 0–37)
Albumin: 4.1 g/dL (ref 3.5–5.2)
Alkaline Phosphatase: 77 U/L (ref 39–117)
BUN: 13 mg/dL (ref 6–23)
CO2: 29 meq/L (ref 19–32)
Calcium: 10 mg/dL (ref 8.4–10.5)
Chloride: 106 meq/L (ref 96–112)
Creatinine, Ser: 0.91 mg/dL (ref 0.40–1.20)
GFR: 59.78 mL/min — ABNORMAL LOW (ref >60.00)
Glucose, Bld: 89 mg/dL (ref 70–99)
Potassium: 4.6 meq/L (ref 3.5–5.1)
Sodium: 141 meq/L (ref 135–145)
Total Bilirubin: 0.4 mg/dL (ref 0.2–1.2)
Total Protein: 6.8 g/dL (ref 6.0–8.3)

## 2023-04-22 LAB — HEMOGLOBIN A1C: Hgb A1c MFr Bld: 5.5 % (ref 4.6–6.5)

## 2023-04-22 LAB — LIPID PANEL
Cholesterol: 176 mg/dL (ref 0–200)
HDL: 32.9 mg/dL — ABNORMAL LOW (ref >39.00)
LDL Cholesterol: 118 mg/dL — ABNORMAL HIGH (ref 0–99)
NonHDL: 143.56
Total CHOL/HDL Ratio: 5
Triglycerides: 126 mg/dL (ref 0.0–149.0)
VLDL: 25.2 mg/dL (ref 0.0–40.0)

## 2023-04-22 LAB — LIPASE: Lipase: 39 U/L (ref 11.0–59.0)

## 2023-04-22 LAB — LDL CHOLESTEROL, DIRECT: Direct LDL: 132 mg/dL

## 2023-04-24 ENCOUNTER — Encounter: Payer: Self-pay | Admitting: Internal Medicine

## 2023-04-25 ENCOUNTER — Encounter: Payer: Self-pay | Admitting: Internal Medicine

## 2023-04-25 ENCOUNTER — Ambulatory Visit: Payer: Medicare Other | Admitting: Internal Medicine

## 2023-04-25 VITALS — BP 100/62 | HR 88 | Ht 71.0 in | Wt 209.0 lb

## 2023-04-25 DIAGNOSIS — F321 Major depressive disorder, single episode, moderate: Secondary | ICD-10-CM

## 2023-04-25 DIAGNOSIS — G720 Drug-induced myopathy: Secondary | ICD-10-CM | POA: Diagnosis not present

## 2023-04-25 DIAGNOSIS — Z1231 Encounter for screening mammogram for malignant neoplasm of breast: Secondary | ICD-10-CM

## 2023-04-25 DIAGNOSIS — Z7985 Long-term (current) use of injectable non-insulin antidiabetic drugs: Secondary | ICD-10-CM | POA: Diagnosis not present

## 2023-04-25 DIAGNOSIS — I499 Cardiac arrhythmia, unspecified: Secondary | ICD-10-CM | POA: Diagnosis not present

## 2023-04-25 DIAGNOSIS — E6609 Other obesity due to excess calories: Secondary | ICD-10-CM | POA: Diagnosis not present

## 2023-04-25 DIAGNOSIS — E119 Type 2 diabetes mellitus without complications: Secondary | ICD-10-CM

## 2023-04-25 DIAGNOSIS — R748 Abnormal levels of other serum enzymes: Secondary | ICD-10-CM | POA: Diagnosis not present

## 2023-04-25 DIAGNOSIS — T466X5A Adverse effect of antihyperlipidemic and antiarteriosclerotic drugs, initial encounter: Secondary | ICD-10-CM

## 2023-04-25 MED ORDER — SEMAGLUTIDE (1 MG/DOSE) 4 MG/3ML ~~LOC~~ SOPN: 1.0000 mg | PEN_INJECTOR | SUBCUTANEOUS | Status: DC

## 2023-04-25 MED ORDER — ESOMEPRAZOLE MAGNESIUM 40 MG PO CPDR: DELAYED_RELEASE_CAPSULE | ORAL | 3 refills | Status: DC

## 2023-04-25 MED ORDER — ROSUVASTATIN CALCIUM 10 MG PO TABS: 10.0000 mg | ORAL_TABLET | ORAL | 0 refills | Status: DC

## 2023-04-25 NOTE — Progress Notes (Signed)
 Subjective:  Patient ID: Dana Burke, female    DOB: Oct 19, 1945  Age: 78 y.o. MRN: [id number]  CC: The primary encounter diagnosis was Encounter for screening mammogram for malignant neoplasm of breast. Diagnoses of Current moderate episode of major depressive disorder without prior episode (HCC), Irregular heartbeat, Elevated liver enzymes, Statin myopathy, Obesity due to excess calories with serious comorbidity, unspecified class, and Diabetes mellitus treated with injections of non-insulin medication (HCC) were also pertinent to this visit.   HPI Dana Burke presents for  Chief Complaint  Patient presents with   Medical Management of Chronic Issues    3 month follow up    1) Type 2 DM/obesity:   She  feels generally well,  is  exercising regularly and  trying to lose weight. Checking  blood sugars less than once daily at variable times, usually only if she feels she may be having a hypoglycemic event. .  BS have been under 130 fasting and < 150 post prandially.  Denies any recent hypoglyemic events.  Taking   0zempic 0.5 mg weekly and has lost 3 lbs since January visit . [name] Following a carbohydrate modified diet 6 days per week. Denies numbness, burning and tingling of extremities. Appetite is good.    2( Statin intolerance:  recent elevation in liver enzymes now resolved with suspension of statin   2) MDD:  doing well on wellbutrin and lexapro  3) GERD: using nexium in the morning with good results   4) arrhythmia:   reviewed prior cardiac workup including cath and ZIO ; both were normal and she is no longer symptomatic while  taking metoprolol   Outpatient Medications Prior to Visit  Medication Sig Dispense Refill   acetaminophen (TYLENOL) 325 MG tablet Take by mouth.     albuterol (VENTOLIN HFA) 108 (90 Base) MCG/ACT inhaler Inhale into the lungs every 6 (six) hours as needed for wheezing or shortness of breath.     Blood Glucose Monitoring Suppl (ONETOUCH VERIO)  w/Device KIT Use as instructed to check blood glucose once daily as needed. 1 kit 0   buPROPion (WELLBUTRIN XL) 300 MG 24 hr tablet Take 1 tablet (300 mg total) by mouth daily. 90 tablet 2   cetirizine (ZYRTEC) 10 MG tablet Take 10 mg by mouth at bedtime. As needed     Cholecalciferol (VITAMIN D-3) 1000 UNITS CAPS Take 1 capsule by mouth daily.     Docusate Sodium (COLACE PO) Take 1 capsule by mouth daily.     escitalopram (LEXAPRO) 10 MG tablet Take 1 tablet (10 mg total) by mouth daily. 90 tablet 1   gabapentin (NEURONTIN) 300 MG capsule TAKE 1 CAPSULE BY MOUTH 4 TIMES DAILY 360 capsule 1   glucose blood test strip Use as instructed to check blood glucose once daily as needed 50 each 12   Lancets MISC Use to check blood glucose once daily as needed 100 each 11   Magnesium Citrate 125 MG CAPS Take 2 capsules by mouth every evening. 180 capsule 2   Melatonin 10 MG TABS Take 1 tablet by mouth.     Multiple Vitamins-Minerals (MULTIVITAMIN WITH MINERALS) tablet Take 1 tablet by mouth daily.     nitroGLYCERIN (NITROSTAT) 0.4 MG SL tablet Place 1 tablet (0.4 mg total) under the tongue every 5 (five) minutes as needed for chest pain. 50 tablet 3   esomeprazole (NEXIUM) 40 MG capsule TAKE 1 CAPSULE BY MOUTH ONCE DAILY AT 12NOON 90 capsule 3   Semaglutide,0.25  or 0.5MG /DOS, 2 MG/3ML SOPN Inject 0.5 mg into the skin once a week. 3 mL 2   No facility-administered medications prior to visit.    Review of Systems;  Patient denies headache, fevers, malaise, unintentional weight loss, skin rash, eye pain, sinus congestion and sinus pain, sore throat, dysphagia,  hemoptysis , cough, dyspnea, wheezing, chest pain, palpitations, orthopnea, edema, abdominal pain, nausea, melena, diarrhea, constipation, flank pain, dysuria, hematuria, urinary  Frequency, nocturia, numbness, tingling, seizures,  Focal weakness, Loss of consciousness,  Tremor, insomnia, depression, anxiety, and suicidal ideation.      Objective:   BP 100/62   Pulse 88   Ht 5\' 11"  (1.803 m)   Wt 209 lb (94.8 kg)   SpO2 97%   BMI 29.15 kg/m   BP Readings from Last 3 Encounters:  04/25/23 100/62  01/21/23 106/68  11/06/22 106/66    Wt Readings from Last 3 Encounters:  04/25/23 209 lb (94.8 kg)  01/21/23 212 lb 3.2 oz (96.3 kg)  11/06/22 231 lb (104.8 kg)    Physical Exam Vitals reviewed.  Constitutional:      General: She is not in acute distress.    Appearance: Normal appearance. She is normal weight. She is not ill-appearing, toxic-appearing or diaphoretic.  HENT:     Head: Normocephalic.  Eyes:     General: No scleral icterus.       Right eye: No discharge.        Left eye: No discharge.     Conjunctiva/sclera: Conjunctivae normal.  Cardiovascular:     Rate and Rhythm: Normal rate and regular rhythm.     Heart sounds: Normal heart sounds.  Pulmonary:     Effort: Pulmonary effort is normal. No respiratory distress.     Breath sounds: Normal breath sounds.  Musculoskeletal:        General: Normal range of motion.  Skin:    General: Skin is warm and dry.  Neurological:     General: No focal deficit present.     Mental Status: She is alert and oriented to person, place, and time. Mental status is at baseline.  Psychiatric:        Mood and Affect: Mood normal.        Behavior: Behavior normal.        Thought Content: Thought content normal.        Judgment: Judgment normal.    Lab Results  Component Value Date   HGBA1C 5.5 04/22/2023   HGBA1C 6.2 01/07/2023   HGBA1C 11.8 (A) 10/04/2022    Lab Results  Component Value Date   CREATININE 0.91 04/22/2023   CREATININE 0.87 01/07/2023   CREATININE 0.85 09/28/2022    Lab Results  Component Value Date   WBC 6.4 03/15/2022   HGB 13.8 03/15/2022   HCT 42.0 03/15/2022   PLT 210 03/15/2022   GLUCOSE 89 04/22/2023   CHOL 176 04/22/2023   TRIG 126.0 04/22/2023   HDL 32.90 (L) 04/22/2023   LDLDIRECT 132.0 04/22/2023   LDLCALC 118 (H) 04/22/2023   ALT  14 04/22/2023   AST 15 04/22/2023   NA 141 04/22/2023   K 4.6 04/22/2023   CL 106 04/22/2023   CREATININE 0.91 04/22/2023   BUN 13 04/22/2023   CO2 29 04/22/2023   TSH 1.19 12/14/2020   HGBA1C 5.5 04/22/2023   MICROALBUR 1.2 01/07/2023    MR 3D Recon At Scanner Result Date: 01/21/2023 : CLINICAL DATA: Biliary obstruction suspected. RIGHT upper quadrant pain. Elevated liver enzymes.  Prior cholecystectomy. Concern for choledocholithiasis. EXAM: MRI ABDOMEN WITHOUT AND WITH CONTRAST (INCLUDING MRCP) TECHNIQUE: Multiplanar multisequence MR imaging of the abdomen was performed both before and after the administration of intravenous contrast. Heavily T2-weighted images of the biliary and pancreatic ducts were obtained, and three-dimensional MRCP images were rendered by post processing. CONTRAST: 7.72mL GADAVIST GADOBUTROL 1 MMOL/ML IV SOLN COMPARISON: CT 11/15/2022 FINDINGS: Lower chest: Lung bases are clear. Hepatobiliary: Postcholecystectomy. The common hepatic duct and common bile duct are mildly prominent which is typical following cholecystectomy. Common bile duct measures 7 mm (image 15/series 3. There are no filling defects within the common bile duct. No hepatic steatosis. Pancreas: No pancreatic duct dilatation. Tiny cystic lesion in the mid body of the pancreas measuring 4 mm (image 20/series 4) is not changed from comparison exam. Spleen: Normal spleen. Adrenals/urinary tract: Adrenal glands normal. 2.5 cm cystic lesion of the LEFT kidney without enhancement. No change from prior. Findings consistent benign renal cysts. No follow-up recommended for benign renal lesion. Stomach/Bowel: Stomach and limited of the small bowel is unremarkable Vascular/Lymphatic: Abdominal aortic normal caliber. No retroperitoneal periportal lymphadenopathy. Musculoskeletal: No aggressive osseous lesion IMPRESSION: 1. No evidence of choledocholithiasis. 2. Mild prominence of the common bile duct and common hepatic duct is  typical following cholecystectomy. 3. No hepatic steatosis. 4. Tiny cystic lesion in the mid body of the pancreas is not changed from comparison exam. Favor small side branch IPMN. No follow-up recommended. 5. Benign Bosniak 1 renal cyst of the LEFT kidney. No follow-up recommended for benign renal lesion. Electronically Signed   By: Genevive Bi M.D.   On: 01/21/2023 11:48   MR ABDOMEN MRCP W WO CONTAST Result Date: 01/21/2023 CLINICAL DATA:  Biliary obstruction suspected. RIGHT upper quadrant pain. Elevated liver enzymes. Prior cholecystectomy. Concern for choledocholithiasis. EXAM: MRI ABDOMEN WITHOUT AND WITH CONTRAST (INCLUDING MRCP) TECHNIQUE: Multiplanar multisequence MR imaging of the abdomen was performed both before and after the administration of intravenous contrast. Heavily T2-weighted images of the biliary and pancreatic ducts were obtained, and three-dimensional MRCP images were rendered by post processing. CONTRAST:  7.35mL GADAVIST GADOBUTROL 1 MMOL/ML IV SOLN COMPARISON:  CT 11/15/2022 FINDINGS: Lower chest:  Lung bases are clear. Hepatobiliary: Postcholecystectomy. The common hepatic duct and common bile duct are mildly prominent which is typical following cholecystectomy. Common bile duct measures 7 mm (image 15/series 3. There are no filling defects within the common bile duct. No hepatic steatosis. Pancreas: No pancreatic duct dilatation. Tiny cystic lesion in the mid body of the pancreas measuring 4 mm (image 20/series 4) is not changed from comparison exam. Spleen: Normal spleen. Adrenals/urinary tract: Adrenal glands normal. 2.5 cm cystic lesion of the LEFT kidney without enhancement. No change from prior. Findings consistent benign renal cysts. No follow-up recommended for benign renal lesion. Stomach/Bowel: Stomach and limited of the small bowel is unremarkable Vascular/Lymphatic: Abdominal aortic normal caliber. No retroperitoneal periportal lymphadenopathy. Musculoskeletal: No  aggressive osseous lesion IMPRESSION: 1. No evidence of choledocholithiasis. 2. Mild prominence of the common bile duct and common hepatic duct is typical following cholecystectomy. 3. No hepatic steatosis. 4. Tiny cystic lesion in the mid body of the pancreas is not changed from comparison exam. Favor small side branch IPMN. No follow-up recommended. 5. Benign Bosniak 1 renal cyst of the LEFT kidney. No follow-up recommended for benign renal lesion. Electronically Signed   By: Genevive Bi M.D.   On: 01/21/2023 11:28    Assessment & Plan:  .Encounter for screening mammogram for malignant neoplasm of  breast -     3D Screening Mammogram, Left and Right; Future  Current moderate episode of major depressive disorder without prior episode (HCC)  Irregular heartbeat  Elevated liver enzymes Assessment & Plan: Etiology presumed to be  statin induced transaminitis  given normal MRCP . NO STATIN TRIALS ADVISED   Lab Results  Component Value Date   ALT 14 04/22/2023   AST 15 04/22/2023   ALKPHOS 77 04/22/2023   BILITOT 0.4 04/22/2023      Statin myopathy Assessment & Plan: She has severe liver enzyme eldvation in dec 2024 while on statin trial .   Obesity due to excess calories with serious comorbidity, unspecified class Assessment & Plan: She has adopted a low glycemic index diet and and using ozempic to lose weight    Diabetes mellitus treated with injections of non-insulin medication (HCC) Assessment & Plan: A1c is at goal on ozempic .  Increasing her dose of ozempic to 1 mg weekly  Lab Results  Component Value Date   HGBA1C 5.5 04/22/2023   Lab Results  Component Value Date   MICROALBUR 1.2 01/07/2023   MICROALBUR 15.3 (H) 10/18/2022        Other orders -     Esomeprazole Magnesium; TAKE 1 CAPSULE BY MOUTH ONCE DAILY AT 12NOON  Dispense: 90 capsule; Refill: 3 -     Semaglutide (1 MG/DOSE); Inject 1 mg as directed once a week.     I spent 34 minutes on the day  of this face to face encounter reviewing patient's  most recent visit with cardiology,  prior relevant surgical and non surgical procedures, recent  labs and imaging studies, counseling on weight management,  reviewing the assessment and plan with patient, and post visit ordering and reviewing of  diagnostics and therapeutics with patient  .   Follow-up: Return in about 3 months (around 07/25/2023) for follow up diabetes.   Sherlene Shams, MD

## 2023-04-25 NOTE — Assessment & Plan Note (Signed)
 A1c is at goal on ozempic .  Increasing her dose of ozempic to 1 mg weekly  Lab Results  Component Value Date   HGBA1C 5.5 04/22/2023   Lab Results  Component Value Date   MICROALBUR 1.2 01/07/2023   MICROALBUR 15.3 (H) 10/18/2022

## 2023-04-25 NOTE — Assessment & Plan Note (Signed)
 She has severe liver enzyme eldvation in dec 2024 while on statin trial .

## 2023-04-25 NOTE — Assessment & Plan Note (Signed)
 She has adopted a low glycemic index diet and and using ozempic to lose weight

## 2023-04-25 NOTE — Assessment & Plan Note (Addendum)
 Etiology presumed to be  statin induced transaminitis  given normal MRCP . NO STATIN TRIALS ADVISED   Lab Results  Component Value Date   ALT 14 04/22/2023   AST 15 04/22/2023   ALKPHOS 77 04/22/2023   BILITOT 0.4 04/22/2023

## 2023-04-25 NOTE — Patient Instructions (Addendum)
 Statin therapy is now considered "standard of care" for primary prevention of heart disease in all patients who have a diagnosis of type 2 diabetes.    However, You are EXEMPT from another statin trial given the reaction your liver  had to the atorvastatin trial in December    Go get the TDaP vaccine at your pharmacy.    you are due next week  I will increase the ozempic dose to 1 mg weekly.  If you do not tolerate it, you can still use the pens and reduce the dose to 0.5 mg using the following conversion table  because OZEMPIC PENS CAN BE MANIPULATED INTO DELIVERING LOWER DOSES BY "COUNTING CLICKS"   IF YOU ARE USING THE 4 MG PEN  (TEAL( THAT DELIVERS THE 1 MG WEEKLY DOSE) :  19 CLICKS    0.25 MG DOSE 37 CLICKS    0.50 MG DOSE

## 2023-04-26 MED ORDER — TETANUS-DIPHTH-ACELL PERTUSSIS 5-2.5-18.5 LF-MCG/0.5 IM SUSP: 0.5000 mL | Freq: Once | INTRAMUSCULAR | 0 refills | Status: AC

## 2023-04-26 NOTE — Telephone Encounter (Signed)
 Copied from CRM [id number]. Topic: Clinical - Prescription Issue >> Apr 26, 2023 10:31 AM Lorin Glass B wrote: Reason for CRM: Patient called in stating that she saw Dr. Darrick Huntsman yesterday and she was to send an Rx of Tetanus T-dap Vaccine Rx to pharmacy. Patient states she called pharmacy to pick it up but pharmacy has not received it.  Callback [phone]  Pharmacy: [facility] [address] [place] [address] Phone: [phone] Fax: [phone]

## 2023-05-14 ENCOUNTER — Telehealth: Payer: Self-pay

## 2023-05-14 NOTE — Telephone Encounter (Signed)
Spoke with pt to let her know that we have received her patient assistance medication at the office and it is ready for pick up.   Ozempic 1 mg: 4 boxes

## 2023-05-15 NOTE — Telephone Encounter (Signed)
Pt has picked up medication.  

## 2023-06-03 ENCOUNTER — Encounter: Payer: Self-pay | Admitting: Family Medicine

## 2023-06-03 ENCOUNTER — Emergency Department
Admission: EM | Admit: 2023-06-03 | Discharge: 2023-06-03 | Disposition: A | Discharge status: Home / Self Care | Attending: Emergency Medicine | Admitting: Emergency Medicine

## 2023-06-03 ENCOUNTER — Encounter: Payer: Self-pay | Admitting: Intensive Care

## 2023-06-03 ENCOUNTER — Other Ambulatory Visit: Payer: Self-pay

## 2023-06-03 ENCOUNTER — Ambulatory Visit: Payer: Self-pay

## 2023-06-03 ENCOUNTER — Ambulatory Visit (INDEPENDENT_AMBULATORY_CARE_PROVIDER_SITE_OTHER): Admitting: Family Medicine

## 2023-06-03 VITALS — BP 100/68 | HR 90 | Temp 98.4°F | Resp 20 | Ht 71.0 in | Wt 205.4 lb

## 2023-06-03 DIAGNOSIS — R197 Diarrhea, unspecified: Secondary | ICD-10-CM

## 2023-06-03 DIAGNOSIS — R638 Other symptoms and signs concerning food and fluid intake: Secondary | ICD-10-CM | POA: Diagnosis not present

## 2023-06-03 DIAGNOSIS — E162 Hypoglycemia, unspecified: Secondary | ICD-10-CM | POA: Diagnosis not present

## 2023-06-03 DIAGNOSIS — D72829 Elevated white blood cell count, unspecified: Secondary | ICD-10-CM | POA: Insufficient documentation

## 2023-06-03 DIAGNOSIS — E860 Dehydration: Secondary | ICD-10-CM | POA: Diagnosis not present

## 2023-06-03 DIAGNOSIS — Z7985 Long-term (current) use of injectable non-insulin antidiabetic drugs: Secondary | ICD-10-CM

## 2023-06-03 DIAGNOSIS — W19XXXA Unspecified fall, initial encounter: Secondary | ICD-10-CM

## 2023-06-03 DIAGNOSIS — E119 Type 2 diabetes mellitus without complications: Secondary | ICD-10-CM

## 2023-06-03 HISTORY — DX: Type 2 diabetes mellitus without complications: E11.9

## 2023-06-03 LAB — CBC WITH DIFFERENTIAL/PLATELET
Abs Immature Granulocytes: 0.03 10*3/uL (ref 0.00–0.07)
Basophils Absolute: 0 10*3/uL (ref 0.0–0.1)
Basophils Relative: 0 %
Eosinophils Absolute: 0.2 10*3/uL (ref 0.0–0.5)
Eosinophils Relative: 1 %
HCT: 43 % (ref 36.0–46.0)
Hemoglobin: 14.2 g/dL (ref 12.0–15.0)
Immature Granulocytes: 0 %
Lymphocytes Relative: 30 %
Lymphs Abs: 3.3 10*3/uL (ref 0.7–4.0)
MCH: 31.2 pg (ref 26.0–34.0)
MCHC: 33 g/dL (ref 30.0–36.0)
MCV: 94.5 fL (ref 80.0–100.0)
Monocytes Absolute: 0.8 10*3/uL (ref 0.1–1.0)
Monocytes Relative: 7 %
Neutro Abs: 6.7 10*3/uL (ref 1.7–7.7)
Neutrophils Relative %: 62 %
Platelets: 249 10*3/uL (ref 150–400)
RBC: 4.55 MIL/uL (ref 3.87–5.11)
RDW: 12.5 % (ref 11.5–15.5)
WBC: 10.9 10*3/uL — ABNORMAL HIGH (ref 4.0–10.5)
nRBC: 0 % (ref 0.0–0.2)

## 2023-06-03 LAB — COMPREHENSIVE METABOLIC PANEL WITH GFR
ALT: 18 U/L (ref 0–44)
AST: 21 U/L (ref 15–41)
Albumin: 4.1 g/dL (ref 3.5–5.0)
Alkaline Phosphatase: 74 U/L (ref 38–126)
Anion gap: 8 (ref 5–15)
BUN: 23 mg/dL (ref 8–23)
CO2: 25 mmol/L (ref 22–32)
Calcium: 10.3 mg/dL (ref 8.9–10.3)
Chloride: 103 mmol/L (ref 98–111)
Creatinine, Ser: 1.15 mg/dL — ABNORMAL HIGH (ref 0.44–1.00)
GFR, Estimated: 48 mL/min — ABNORMAL LOW (ref >60)
Glucose, Bld: 92 mg/dL (ref 70–99)
Potassium: 3.8 mmol/L (ref 3.5–5.1)
Sodium: 136 mmol/L (ref 135–145)
Total Bilirubin: 0.9 mg/dL (ref 0.0–1.2)
Total Protein: 7.3 g/dL (ref 6.5–8.1)

## 2023-06-03 LAB — GLUCOSE, POCT (MANUAL RESULT ENTRY): POC Glucose: 93 mg/dL (ref 70–99)

## 2023-06-03 LAB — CBG MONITORING, ED: Glucose-Capillary: 84 mg/dL (ref 70–99)

## 2023-06-03 LAB — LIPASE, BLOOD: Lipase: 26 U/L (ref 11–51)

## 2023-06-03 MED ORDER — ONDANSETRON HCL 4 MG/2ML IJ SOLN
4.0000 mg | Freq: Once | INTRAMUSCULAR | Status: AC
  Administered 2023-06-03: 4 mg via INTRAVENOUS
  Filled 2023-06-03: qty 2

## 2023-06-03 MED ORDER — ONDANSETRON HCL 4 MG PO TABS: 4.0000 mg | ORAL_TABLET | Freq: Three times a day (TID) | ORAL | 0 refills | Status: AC | PRN

## 2023-06-03 MED ORDER — LACTATED RINGERS IV BOLUS
1000.0000 mL | Freq: Once | INTRAVENOUS | Status: AC
  Administered 2023-06-03: 1000 mL via INTRAVENOUS

## 2023-06-03 NOTE — ED Triage Notes (Signed)
 Patient c/o diarrhea X3 days. Sent by PCP for dehydration. Reports falling X2 today because her legs gave out.  Takes ozympic    Refused wheelchair in triage

## 2023-06-03 NOTE — Telephone Encounter (Signed)
 Pt is scheduled to see Dr. Sueanne Emerald today at 3 pm.

## 2023-06-03 NOTE — Progress Notes (Unsigned)
 SUBJECTIVE:   Chief Complaint  Patient presents with   Diarrhea    X 3 days mostly at night Blood sugars have been    Blood Sugar Problem    Had not got over 90 but yesterday am had a 30 reading and Saturday it was 29   HPI Presents for acute visit  Discussed the use of AI scribe software for clinical note transcription with the patient, who gave verbal consent to proceed.  History of Present Illness Dana Burke is an 78 year old female with diabetes who presents with severe diarrhea and concerns of dehydration. Her sister is present at the visit.  She has been experiencing diarrhea since Friday night, which has progressively worsened. There have been approximately six to eight episodes of watery diarrhea during the night, described as 'explosive' and uncontrollable, but none during the daytime. There have been no recent changes in diet, eating out, or exposure to sick individuals. No fever, vomiting, or abdominal pain. The stool appears as 'colored water' without blood.  She has a history of type 2 diabetes and has been on Ozempic , which was increased two months ago. She has not experienced diarrhea since starting Ozempic , but had issues with constipation, for which she was taking Colace. She stopped taking Colace on Thursday. She has not taken magnesium  citrate, which was prescribed as a stronger stool softener, and she takes a multivitamin for women over fifty. Her blood sugar was recorded at 93 mg/dL during the visit, and she mentioned a previous A1c of 6.3. No recent antibiotic use.  She has a history of a 13-pound abdominal mass removed four years ago and no longer has her gallbladder. She has not experienced any nausea or vomiting, but reports a bad taste when drinking certain fluids like Gatorade and orange juice. She has been unable to maintain adequate fluid intake, having only consumed a small glass of Pepsi and some orange juice today. She reports feeling 'pasty' and has  not urinated much, only once in the morning and once in the late afternoon or evening, which she states is typical for her.  She has experienced two falls today after standing up, with her legs giving way, but denies any loss of consciousness. She has not hit her head during these falls. Her blood pressure is typically low. She has a daughter who is a Engineer, civil (consulting), who suggested the possibility of gastroparesis. 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 Her daughter, who is also a Engineer, civil (consulting), has been concerned about her condition. She has not traveled recently and has no pets. Her well water has not been tested in the past ten years, but she does not drink it, although she cooks with it. She has been living in her mother's house for the past ten years.     PERTINENT PMH / PSH: As above   OBJECTIVE:  BP 100/68   Pulse 90   Temp 98.4 F (36.9 C)   Resp 20   Ht 5\' 11"  (1.803 m)   Wt 205 lb 6 oz (93.2 kg)   SpO2 98%   BMI 28.64 kg/m    Physical Exam Vitals reviewed.  Constitutional:      General: She is not in acute distress.    Appearance: Normal appearance. She is normal weight. She is not ill-appearing, toxic-appearing or diaphoretic.  Eyes:     General:        Right eye: No discharge.  Left eye: No discharge.     Conjunctiva/sclera: Conjunctivae normal.  Cardiovascular:     Rate and Rhythm: Normal rate and regular rhythm.     Heart sounds: Normal heart sounds.  Pulmonary:     Effort: Pulmonary effort is normal.     Breath sounds: Normal breath sounds.  Abdominal:     General: Bowel sounds are normal.     Tenderness: There is no abdominal tenderness. There is no right CVA tenderness or left CVA tenderness.  Musculoskeletal:        General: Normal range of motion.  Skin:    General: Skin is warm and dry.  Neurological:     General: No focal deficit present.     Mental Status: She is alert and oriented to person, place, and  time. Mental status is at baseline.     Motor: No weakness.  Psychiatric:        Mood and Affect: Mood normal.        Behavior: Behavior normal.        Thought Content: Thought content normal.        Judgment: Judgment normal.           06/03/2023    3:08 PM 04/25/2023   10:00 AM 01/21/2023   10:04 AM 10/18/2022   10:57 AM 10/04/2022    9:58 AM  Depression screen PHQ 2/9  Decreased Interest 0 0 0 0 0  Down, Depressed, Hopeless 0 0 0 0 0  PHQ - 2 Score 0 0 0 0 0  Altered sleeping 0   0 0  Tired, decreased energy 0   0 3  Change in appetite 0   1 0  Feeling bad or failure about yourself  0   0 0  Trouble concentrating 0   0 0  Moving slowly or fidgety/restless 0   0 0  Suicidal thoughts 0   0 0  PHQ-9 Score 0   1 3  Difficult doing work/chores Not difficult at all   Not difficult at all Somewhat difficult      06/03/2023    3:08 PM 10/04/2022    9:59 AM 03/14/2022   10:18 AM  GAD 7 : Generalized Anxiety Score  Nervous, Anxious, on Edge 0 0 0  Control/stop worrying 0 0 0  Worry too much - different things 0 0 0  Trouble relaxing 0 0 0  Restless 0 0 0  Easily annoyed or irritable 0 0 0  Afraid - awful might happen 0 0 0  Total GAD 7 Score 0 0 0  Anxiety Difficulty Not difficult at all Somewhat difficult Not difficult at all    ASSESSMENT/PLAN:  Diarrhea, unspecified type Assessment & Plan: Acute severe non bloody diarrhea and incontinence.  Hemodynamically stable.  No abdominal pain.  Unable to maintain hydration.  Denies any dietary change, travel or recent sick contact. - Transfer to emergency department for evaluation, concern for dehydration.   - Obtain stool samples for analysis if possible. - Consider reducing Ozempic  dosage.   Dehydration symptoms Assessment & Plan: Severe dehydration likely due to persistent diarrhea with concerns for electrolyte imbalance and renal function. - Transfer to emergency department for IV fluids. - Monitor electrolytes and renal  function.   Hypoglycemia Assessment & Plan: Hypoglycemia episodes likely related to increased Ozempic  dosage and inadequate intake, with significant glucose fluctuations. CBG today 93 and asymptomatic. - Transfer to emergency department for monitoring. - Consider reducing Ozempic  dosage once symptoms resolve -  Monitor blood glucose closely.   Diabetes mellitus treated with injections of non-insulin medication (HCC) -     POCT glucose (manual entry)  Fall, initial encounter Assessment & Plan: Falls due to weakness likely secondary to dehydration and hypoglycemia. No LOC, head injury. - Transfer to emergency department for evaluation. - Monitor for head injury or complications.     PDMP reviewed  Return if symptoms worsen or fail to improve, for PCP.  Valli Gaw, MD

## 2023-06-03 NOTE — Discharge Instructions (Signed)
 Please take the nausea medication and make sure you are staying hydrated over the next couple of days to help your symptoms improve.  Please return immediately to the emergency department if you start having severe worsening abdominal pain or unable to keep any foods or fluid down.

## 2023-06-03 NOTE — Assessment & Plan Note (Signed)
 Acute severe non bloody diarrhea and incontinence.  Hemodynamically stable.  No abdominal pain.  Unable to maintain hydration.  Denies any dietary change, travel or recent sick contact. - Transfer to emergency department for evaluation, concern for dehydration.   - Obtain stool samples for analysis if possible. - Consider reducing Ozempic  dosage.

## 2023-06-03 NOTE — Telephone Encounter (Deleted)
 Chief Complaint: Diarrhea - many times overnight. Blood sugar is  Symptoms: diarrhea, a little lightheaded - did fall Frequency: last night - today Pertinent Negatives: Patient denies fever, sob Disposition: [] ED /[] Urgent Care (no appt availability in office) / [x] Appointment(In office/virtual)/ []  [facility]/ [] Home Care/ [] Refused Recommended Disposition /[] [facility]/ []  Follow-up with PCP Additional Notes: Pt has had severe diarrhea overnight. Pt has been unable to get to the restroom in time and has soiled her bed.  Stool is "muddy water". Pt also reports some dizziness including a fall today. Pt reports that blood sugars have been rather labile. This morning fast BS was 120. Yesterday morning it was 30. Pt will continue to monitor BS today. Pt has appt scheduled for this afternoon. Pt will call back if needed.    Copied from CRM [id number]. Topic: Clinical - Red Word Triage >> Jun 03, 2023  9:09 AM Dewanda Foots wrote: Red Word that prompted transfer to Nurse Triage: Blood sugar dips, Severe Diarrhea, dizzy and lightheaded.  Pt states this weekend was really rough on her. She states she has had severe diarrhea constantly all weekend and expresses GI distress. States that today her blood sugar went from 30 to 120 upon standing and that today she stood up and felt like her legs were going to give out.  Expresses that she has been feeling both light-headed and dizzy and would like an appt to address these concerns. Answer Assessment - Initial Assessment Questions 1. SYMPTOMS: "What symptoms are you concerned about?"     *** 2. ONSET:  "When did the symptoms start?"     *** 3. BLOOD GLUCOSE: "What is your blood glucose level?"      *** 4. USUAL RANGE: "What is your blood glucose level usually?" (e.g., usual fasting morning value, usual evening value)     *** 5. TYPE 1 or 2:  "Do you know what type of diabetes you have?"  (e.g., Type 1, Type 2, Gestational; doesn't  know)      *** 6. INSULIN: "Do you take insulin?" "What type of insulin(s) do you use? What is the mode of delivery? (syringe, pen; injection or pump) "When did you last give yourself an insulin dose?" (i.e., time or hours/minutes ago) "How much did you give?" (i.e., how many units)     *** 7. DIABETES PILLS: "Do you take any pills for your diabetes?" If Yes, ask: "What is the name of the medicine(s) that you take for high blood sugar?"     *** 8. OTHER SYMPTOMS: "Do you have any symptoms?" (e.g., fever, frequent urination, difficulty breathing, vomiting)     *** 9. LOW BLOOD GLUCOSE TREATMENT: "What have you done so far to treat the low blood glucose level?"     *** 10. FOOD: "When did you last eat or drink?"       *** 11. ALONE: "Are you alone right now or is someone with you?"        *** 12. PREGNANCY: "Is there any chance you are pregnant?" "When was your last menstrual period?"       ***  Answer Assessment - Initial Assessment Questions 1. DIARRHEA SEVERITY: "How bad is the diarrhea?" "How many more stools have you had in the past 24 hours than normal?"    - NO DIARRHEA (SCALE 0)   - MILD (SCALE 1-3): Few loose or mushy BMs; increase of 1-3 stools over normal daily number of stools; mild increase in ostomy output.   -  MODERATE (SCALE 4-7): Increase of 4-6 stools daily over normal; moderate increase in ostomy output.   -  SEVERE (SCALE 8-10; OR "WORST POSSIBLE"): Increase of 7 or more stools daily over normal; moderate increase in ostomy output; incontinence.     severe 2. ONSET: "When did the diarrhea begin?"      Over the weekend 3. BM CONSISTENCY: "How loose or watery is the diarrhea?"      watery 4. VOMITING: "Are you also vomiting?" If Yes, ask: "How many times in the past 24 hours?"      1x 5. ABDOMEN PAIN: "Are you having any abdomen pain?" If Yes, ask: "What does it feel like?" (e.g., crampy, dull, intermittent, constant)      cramp 6. ABDOMEN PAIN SEVERITY: If present,  ask: "How bad is the pain?"  (e.g., Scale 1-10; mild, moderate, or severe)   - MILD (1-3): doesn't interfere with normal activities, abdomen soft and not tender to touch    - MODERATE (4-7): interferes with normal activities or awakens from sleep, abdomen tender to touch    - SEVERE (8-10): excruciating pain, doubled over, unable to do any normal activities       Mild - moderate 9. EXPOSURE: "Have you traveled to a foreign country recently?" "Have you been exposed to anyone with diarrhea?" "Could you have eaten any food that was spoiled?"     no 10. ANTIBIOTIC USE: "Are you taking antibiotics now or have you taken antibiotics in the past 2 months?"       no 11. OTHER SYMPTOMS: "Do you have any other symptoms?" (e.g., fever, blood in stool)       Stool - is muddy water  Protocols used: Diabetes - Low Blood Sugar-A-AH, Diarrhea-A-AH

## 2023-06-03 NOTE — Telephone Encounter (Signed)
Seen pt in office

## 2023-06-03 NOTE — Telephone Encounter (Signed)
  Answer Assessment - Initial Assessment Questions 1. DIARRHEA SEVERITY: "How bad is the diarrhea?" "How many more stools have you had in the past 24 hours than normal?"    - NO DIARRHEA (SCALE 0)   - MILD (SCALE 1-3): Few loose or mushy BMs; increase of 1-3 stools over normal daily number of stools; mild increase in ostomy output.   -  MODERATE (SCALE 4-7): Increase of 4-6 stools daily over normal; moderate increase in ostomy output.   -  SEVERE (SCALE 8-10; OR "WORST POSSIBLE"): Increase of 7 or more stools daily over normal; moderate increase in ostomy output; incontinence.     severe 2. ONSET: "When did the diarrhea begin?"      Over the weekend 3. BM CONSISTENCY: "How loose or watery is the diarrhea?"      watery 4. VOMITING: "Are you also vomiting?" If Yes, ask: "How many times in the past 24 hours?"      1x 5. ABDOMEN PAIN: "Are you having any abdomen pain?" If Yes, ask: "What does it feel like?" (e.g., crampy, dull, intermittent, constant)      cramp 6. ABDOMEN PAIN SEVERITY: If present, ask: "How bad is the pain?"  (e.g., Scale 1-10; mild, moderate, or severe)   - MILD (1-3): doesn't interfere with normal activities, abdomen soft and not tender to touch    - MODERATE (4-7): interferes with normal activities or awakens from sleep, abdomen tender to touch    - SEVERE (8-10): excruciating pain, doubled over, unable to do any normal activities       Mild - moderate 9. EXPOSURE: "Have you traveled to a foreign country recently?" "Have you been exposed to anyone with diarrhea?" "Could you have eaten any food that was spoiled?"     no 10. ANTIBIOTIC USE: "Are you taking antibiotics now or have you taken antibiotics in the past 2 months?"       no 11. OTHER SYMPTOMS: "Do you have any other symptoms?" (e.g., fever, blood in stool)       Stool - is muddy water  Protocols used: Diabetes - Low Blood Sugar-A-AH, Diarrhea-A-AH

## 2023-06-03 NOTE — Patient Instructions (Signed)
 It was a pleasure meeting you today. Thank you for allowing me to take part in your health care.  Our goals for today as we discussed include:  Please go to [facility] ED for evaluation  Would recommend decreasing Ozempic  in future   This is a list of the screening recommended for you and due dates:  Health Maintenance  Topic Date Due   Zoster (Shingles) Vaccine (1 of 2) 04/05/1964   COVID-19 Vaccine (6 - 2024-25 season) 09/16/2022   DTaP/Tdap/Td vaccine (5 - Td or Tdap) 05/02/2023   Mammogram  06/08/2023   Medicare Annual Wellness Visit  08/06/2023   Flu Shot  08/16/2023   Complete foot exam   10/18/2023   Hemoglobin A1C  10/22/2023   Eye exam for diabetics  11/09/2023   Yearly kidney health urinalysis for diabetes  01/07/2024   Yearly kidney function blood test for diabetes  04/21/2024   Pneumonia Vaccine  Completed   DEXA scan (bone density measurement)  Completed   HPV Vaccine  Aged Out   Meningitis B Vaccine  Aged Out   Colon Cancer Screening  Discontinued   Hepatitis C Screening  Discontinued      If you have any questions or concerns, please do not hesitate to call the office at [phone].  I look forward to our next visit and until then take care and stay safe.  Regards,   Valli Gaw, MD   [facility]

## 2023-06-03 NOTE — ED Provider Notes (Signed)
 [facility] Provider Note    Event Date/Time   First MD Initiated Contact with Patient 06/03/23 1846     (approximate)   History   Diarrhea   HPI Dana Burke is a 78 y.o. female presenting today for diarrhea.  Patient states over the past 3 days she has had diarrhea mostly occurring overnight.  She had 1 episode of nausea with vomiting but none since then.  Denies any specific abdominal pain.  No fevers, chills, dysuria, cough, congestion, chest pain.  Sent by her doctor for IV fluids for dehydration.  Is on Wegovy  which I think may be contributing to the symptoms.     Physical Exam   Triage Vital Signs: ED Triage Vitals  Encounter Vitals Group     BP 06/03/23 1712 101/79     Systolic BP Percentile --      Diastolic BP Percentile --      Pulse Rate 06/03/23 1712 91     Resp 06/03/23 1712 18     Temp 06/03/23 1712 98.9 F (37.2 C)     Temp Source 06/03/23 1712 Oral     SpO2 06/03/23 1712 94 %     Weight 06/03/23 1713 205 lb (93 kg)     Height 06/03/23 1713 6' (1.829 m)     Head Circumference --      Peak Flow --      Pain Score 06/03/23 1713 0     Pain Loc --      Pain Education --      Exclude from Growth Chart --     Most recent vital signs: Vitals:   06/03/23 1712  BP: 101/79  Pulse: 91  Resp: 18  Temp: 98.9 F (37.2 C)  SpO2: 94%   Physical Exam: I have reviewed the vital signs and nursing notes. General: Awake, alert, no acute distress.  Nontoxic appearing. Head:  Atraumatic, normocephalic.   ENT:  EOM intact, PERRL. Oral mucosa is pink and moist with no lesions. Neck: Neck is supple with full range of motion, No meningeal signs. Cardiovascular:  RRR, No murmurs. Peripheral pulses palpable and equal bilaterally. Respiratory:  Symmetrical chest wall expansion.  No rhonchi, rales, or wheezes.  Good air movement throughout.  No use of accessory muscles.   Musculoskeletal:  No cyanosis or edema. Moving extremities with full  ROM Abdomen:  Soft, nontender, nondistended. Neuro:  GCS 15, moving all four extremities, interacting appropriately. Speech clear. Psych:  Calm, appropriate.   Skin:  Warm, dry, no rash.    ED Results / Procedures / Treatments   Labs (all labs ordered are listed, but only abnormal results are displayed) Labs Reviewed  CBC WITH DIFFERENTIAL/PLATELET - Abnormal; Notable for the following components:      Result Value   WBC 10.9 (*)    All other components within normal limits  COMPREHENSIVE METABOLIC PANEL WITH GFR - Abnormal; Notable for the following components:   Creatinine, Ser 1.15 (*)    GFR, Estimated 48 (*)    All other components within normal limits  LIPASE, BLOOD  URINALYSIS, ROUTINE W REFLEX MICROSCOPIC  CBG MONITORING, ED     EKG    RADIOLOGY    PROCEDURES:  Critical Care performed: No  Procedures   MEDICATIONS ORDERED IN ED: Medications  ondansetron  (ZOFRAN ) injection 4 mg (4 mg Intravenous Given 06/03/23 1938)  lactated ringers  bolus 1,000 mL (1,000 mLs Intravenous New Bag/Given 06/03/23 1938)     IMPRESSION /  MDM / ASSESSMENT AND PLAN / ED COURSE  I reviewed the triage vital signs and the nursing notes.                              Differential diagnosis includes, but is not limited to, diarrheal illness, dehydration, electrolyte abnormality, enteritis, colitis  Patient's presentation is most consistent with acute complicated illness / injury requiring diagnostic workup.  Patient is an 78 year old female presenting today for diarrhea.  Abdominal exam is benign at this time.  Vital signs are stable.  Laboratory workup with largely reassuring CBC, CMP, and lipase.  Creatinine was slightly elevated at 1.15 from her baseline of 0.9.  Given 1 L fluid and Zofran .  Patient was able to tolerate drinking without any issues.  She feels much better and would like to go home at this time.  No indication for CT with no abdominal pain and patient is agreeable  with this.  Gave her strict return precautions should she have any worsening symptoms and sent home with Zofran  as needed.     FINAL CLINICAL IMPRESSION(S) / ED DIAGNOSES   Final diagnoses:  Diarrhea, unspecified type  Dehydration     Rx / DC Orders   ED Discharge Orders          Ordered    ondansetron  (ZOFRAN ) 4 MG tablet  Every 8 hours PRN        06/03/23 2013             Note:  This document was prepared using Dragon voice recognition software and may include unintentional dictation errors.   Kandee Orion, MD 06/03/23 2014

## 2023-06-03 NOTE — ED Notes (Signed)
 See triage notes. Patient was sent over from here PCP for dehydration. Patient stated she has had to instances of "falling out" today. Patient is on Ozempic .

## 2023-06-04 ENCOUNTER — Telehealth: Payer: Self-pay

## 2023-06-04 NOTE — Telephone Encounter (Signed)
 Pt called to let you know that she did go to the ED yesterday.

## 2023-06-04 NOTE — Telephone Encounter (Signed)
 Copied from CRM [id number]. Topic: Clinical - Medication Question >> Jun 04, 2023  1:03 PM Dana Burke wrote: Reason for CRM: pt called to follow up stated she went to the er, was given fluids. Pt states she does not have any more diarreah blood sugar is 126 please call pt back at [phone]

## 2023-06-06 NOTE — Telephone Encounter (Signed)
 Called pt and informed her of this information and she stated that she understood.

## 2023-06-06 NOTE — Telephone Encounter (Signed)
 Called pt and she stated that she feels better since leaving the ED. Pt wanted to know if she can start back on Ozempic  she stated that she drop it to .5 instead of the 1 mg. She stated that she has not have diarrhea Monday afternoon.

## 2023-06-09 ENCOUNTER — Encounter: Payer: Self-pay | Admitting: Family Medicine

## 2023-06-09 DIAGNOSIS — E162 Hypoglycemia, unspecified: Secondary | ICD-10-CM | POA: Insufficient documentation

## 2023-06-09 DIAGNOSIS — W19XXXA Unspecified fall, initial encounter: Secondary | ICD-10-CM

## 2023-06-09 DIAGNOSIS — R638 Other symptoms and signs concerning food and fluid intake: Secondary | ICD-10-CM | POA: Insufficient documentation

## 2023-06-09 HISTORY — DX: Unspecified fall, initial encounter: W19.XXXA

## 2023-06-09 NOTE — Assessment & Plan Note (Signed)
 Falls due to weakness likely secondary to dehydration and hypoglycemia. No LOC, head injury. - Transfer to emergency department for evaluation. - Monitor for head injury or complications.

## 2023-06-09 NOTE — Assessment & Plan Note (Signed)
 Severe dehydration likely due to persistent diarrhea with concerns for electrolyte imbalance and renal function. - Transfer to emergency department for IV fluids. - Monitor electrolytes and renal function.

## 2023-06-09 NOTE — Assessment & Plan Note (Signed)
 Hypoglycemia episodes likely related to increased Ozempic  dosage and inadequate intake, with significant glucose fluctuations. CBG today 93 and asymptomatic. - Transfer to emergency department for monitoring. - Consider reducing Ozempic  dosage once symptoms resolve - Monitor blood glucose closely.

## 2023-06-11 ENCOUNTER — Ambulatory Visit

## 2023-06-14 ENCOUNTER — Encounter: Payer: Self-pay | Admitting: Family

## 2023-06-14 ENCOUNTER — Ambulatory Visit (INDEPENDENT_AMBULATORY_CARE_PROVIDER_SITE_OTHER): Admitting: Family

## 2023-06-14 VITALS — BP 106/70 | HR 73 | Temp 98.5°F | Ht 72.0 in | Wt 205.0 lb

## 2023-06-14 DIAGNOSIS — K219 Gastro-esophageal reflux disease without esophagitis: Secondary | ICD-10-CM

## 2023-06-14 DIAGNOSIS — Z807 Family history of other malignant neoplasms of lymphoid, hematopoietic and related tissues: Secondary | ICD-10-CM | POA: Insufficient documentation

## 2023-06-14 DIAGNOSIS — R197 Diarrhea, unspecified: Secondary | ICD-10-CM

## 2023-06-14 DIAGNOSIS — E119 Type 2 diabetes mellitus without complications: Secondary | ICD-10-CM | POA: Diagnosis not present

## 2023-06-14 DIAGNOSIS — Z7985 Long-term (current) use of injectable non-insulin antidiabetic drugs: Secondary | ICD-10-CM

## 2023-06-14 LAB — CBC WITH DIFFERENTIAL/PLATELET
Basophils Absolute: 0 10*3/uL (ref 0.0–0.1)
Basophils Relative: 0.4 % (ref 0.0–3.0)
Eosinophils Absolute: 0.2 10*3/uL (ref 0.0–0.7)
Eosinophils Relative: 3 % (ref 0.0–5.0)
HCT: 41.9 % (ref 36.0–46.0)
Hemoglobin: 14.1 g/dL (ref 12.0–15.0)
Lymphocytes Relative: 33.2 % (ref 12.0–46.0)
Lymphs Abs: 2 10*3/uL (ref 0.7–4.0)
MCHC: 33.5 g/dL (ref 30.0–36.0)
MCV: 93.1 fl (ref 78.0–100.0)
Monocytes Absolute: 0.3 10*3/uL (ref 0.1–1.0)
Monocytes Relative: 5.5 % (ref 3.0–12.0)
Neutro Abs: 3.5 10*3/uL (ref 1.4–7.7)
Neutrophils Relative %: 57.9 % (ref 43.0–77.0)
Platelets: 247 10*3/uL (ref 150.0–400.0)
RBC: 4.5 Mil/uL (ref 3.87–5.11)
RDW: 13.2 % (ref 11.5–15.5)
WBC: 6.1 10*3/uL (ref 4.0–10.5)

## 2023-06-14 LAB — COMPREHENSIVE METABOLIC PANEL WITH GFR
ALT: 15 U/L (ref 0–35)
AST: 17 U/L (ref 0–37)
Albumin: 4.2 g/dL (ref 3.5–5.2)
Alkaline Phosphatase: 80 U/L (ref 39–117)
BUN: 15 mg/dL (ref 6–23)
CO2: 30 meq/L (ref 19–32)
Calcium: 10.3 mg/dL (ref 8.4–10.5)
Chloride: 104 meq/L (ref 96–112)
Creatinine, Ser: 0.99 mg/dL (ref 0.40–1.20)
GFR: 53.97 mL/min — ABNORMAL LOW (ref >60.00)
Glucose, Bld: 85 mg/dL (ref 70–99)
Potassium: 4.5 meq/L (ref 3.5–5.1)
Sodium: 140 meq/L (ref 135–145)
Total Bilirubin: 0.4 mg/dL (ref 0.2–1.2)
Total Protein: 7 g/dL (ref 6.0–8.3)

## 2023-06-14 LAB — TSH: TSH: 1.07 u[IU]/mL (ref 0.35–5.50)

## 2023-06-14 MED ORDER — OZEMPIC (0.25 OR 0.5 MG/DOSE) 2 MG/1.5ML ~~LOC~~ SOPN: 0.2500 mg | PEN_INJECTOR | SUBCUTANEOUS | Status: DC

## 2023-06-14 NOTE — Patient Instructions (Addendum)
 Start pepcid ac over the counter for acid reflux  Decrease ozempic  to 0.25mg  for now  Goal of fasting blood sugar < 120.   Call the office for readings above 200 as discussed  We will screen you for multiple myeloma.

## 2023-06-14 NOTE — Progress Notes (Signed)
 Assessment & Plan:  Family history of multiple myeloma Assessment & Plan: Pending multiple myeloma screen due to family history. No overt symptoms at this time.   Orders: -     Multiple Myeloma Panel (SPEP&IFE w/QIG) -     IFE AND PE, RANDOM URINE -     PE and FLC, Serum  Gastroesophageal reflux disease without esophagitis Assessment & Plan: Presentation c/w GERD. Advised to start OTC pepcid in addition to nexium  40mg  qam.   Orders: -     CBC with Differential/Platelet -     Comprehensive metabolic panel with GFR -     TSH  Diarrhea, unspecified type -     TSH  Diabetes mellitus treated with injections of non-insulin medication (HCC) Assessment & Plan: Concern for side effects r/t to Ozempic . She has occasional loose stools; overall symptoms improved. Advise to decrease ozempic  to 0.25mg . She will monitor FBG at home and will call us  for readings > 200. Consider dose escalation if GI symptoms completely resolve.     Other orders -     Ozempic  (0.25 or 0.5 MG/DOSE); Inject 0.25 mg into the skin once a week.     Return precautions given.   Risks, benefits, and alternatives of the medications and treatment plan prescribed today were discussed, and patient expressed understanding.   Education regarding symptom management and diagnosis given to patient on AVS either electronically or printed.  Return in about 6 weeks (around 07/26/2023).  Dana Bossier, FNP  Subjective:    Patient ID: Dana Burke, female    DOB: November 02, 1945, 78 y.o.   MRN: [id number]  CC: Dana Burke is a 78 y.o. female who presents today for follow up.   HPI: She is feeling better today.   She complains of intermittent burping, x 10 days, more noticeable at night when laying down.  Associated epigastric burning.   Denies CP, pain with swallowing, trouble swallowing.   She is compliant with nexium  40mg  qam    Stools can be 'loose' at times. No longer watery brown.   Nausea has  resolved.   Denies chills, fever, abdominal pian  She is compliant with ozempic  0.5mg  , decreased from 1mg  for one week. She prefers to stay on ozempic  as she has lost significant amount of weight.     She is no longer on metformin   FBG 120.   He brother passed away 6 months ago from multiple myeloma. She is concerned based on her symptoms.  Denies unusual bone pain, unusual weight loss.    H/o DM She has been on ozempic , dose increased 2 months ago She has had constipation on ozempic  She has used colace No recent abx  Seen for diarrhea 06/03/23 Dr Dana Burke and advised to go to ED d/t severe dehydration.  ED with Crt 1.15. Given 1l fluid and zofran .  Discharged home. No abdominal imaging  Colonoscopy 10/2012, polyp, diverticulosis  H/o cholecystectomy     A1c 5.5 (6.2)   Allergies: Atorvastatin  and Metformin  and related Current Outpatient Medications on File Prior to Visit  Medication Sig Dispense Refill   acetaminophen  (TYLENOL ) 325 MG tablet Take by mouth.     albuterol  (VENTOLIN  HFA) 108 (90 Base) MCG/ACT inhaler Inhale into the lungs every 6 (six) hours as needed for wheezing or shortness of breath.     Blood Glucose Monitoring Suppl (ONETOUCH VERIO) w/Device KIT Use as instructed to check blood glucose once daily as needed. 1 kit 0   buPROPion  (WELLBUTRIN  XL)  300 MG 24 hr tablet Take 1 tablet (300 mg total) by mouth daily. 90 tablet 2   cetirizine (ZYRTEC) 10 MG tablet Take 10 mg by mouth at bedtime. As needed     Cholecalciferol (VITAMIN D -3) 1000 UNITS CAPS Take 1 capsule by mouth daily.     Docusate Sodium  (COLACE PO) Take 1 capsule by mouth daily.     escitalopram  (LEXAPRO ) 10 MG tablet Take 1 tablet (10 mg total) by mouth daily. 90 tablet 1   esomeprazole  (NEXIUM ) 40 MG capsule TAKE 1 CAPSULE BY MOUTH ONCE DAILY AT 12NOON 90 capsule 3   gabapentin  (NEURONTIN ) 300 MG capsule TAKE 1 CAPSULE BY MOUTH 4 TIMES DAILY 360 capsule 1   glucose blood test strip Use as  instructed to check blood glucose once daily as needed 50 each 12   Lancets MISC Use to check blood glucose once daily as needed 100 each 11   Magnesium  Citrate 125 MG CAPS Take 2 capsules by mouth every evening. (Patient taking differently: Take 2 capsules by mouth as needed.) 180 capsule 2   Melatonin 10 MG TABS Take 1 tablet by mouth.     Multiple Vitamins-Minerals (MULTIVITAMIN WITH MINERALS) tablet Take 1 tablet by mouth daily.     nitroGLYCERIN  (NITROSTAT ) 0.4 MG SL tablet Place 1 tablet (0.4 mg total) under the tongue every 5 (five) minutes as needed for chest pain. 50 tablet 3   ondansetron  (ZOFRAN ) 4 MG tablet Take 1 tablet (4 mg total) by mouth every 8 (eight) hours as needed. 15 tablet 0   No current facility-administered medications on file prior to visit.    Review of Systems  Constitutional:  Negative for chills and fever.  Respiratory:  Negative for cough.   Cardiovascular:  Negative for chest pain and palpitations.  Gastrointestinal:  Negative for anal bleeding, constipation, diarrhea, nausea and vomiting.      Objective:    BP 106/70   Pulse 73   Temp 98.5 F (36.9 C) (Oral)   Ht 6' (1.829 m)   Wt 205 lb (93 kg)   SpO2 96%   BMI 27.80 kg/m  BP Readings from Last 3 Encounters:  06/14/23 106/70  06/03/23 123/76  06/03/23 100/68   Wt Readings from Last 3 Encounters:  06/14/23 205 lb (93 kg)  06/03/23 205 lb (93 kg)  06/03/23 205 lb 6 oz (93.2 kg)    Physical Exam Vitals reviewed.  Constitutional:      Appearance: Normal appearance. She is well-developed.  Eyes:     Conjunctiva/sclera: Conjunctivae normal.  Cardiovascular:     Rate and Rhythm: Normal rate and regular rhythm.     Pulses: Normal pulses.     Heart sounds: Normal heart sounds.  Pulmonary:     Effort: Pulmonary effort is normal.     Breath sounds: Normal breath sounds. No wheezing, rhonchi or rales.  Abdominal:     General: Bowel sounds are normal. There is no distension.     Palpations:  Abdomen is soft. Abdomen is not rigid. There is no fluid wave or mass.     Tenderness: There is no abdominal tenderness. There is no guarding or rebound.  Skin:    General: Skin is warm and dry.  Neurological:     Mental Status: She is alert.  Psychiatric:        Speech: Speech normal.        Behavior: Behavior normal.        Thought Content: Thought content normal.

## 2023-06-14 NOTE — Assessment & Plan Note (Addendum)
 Concern for side effects r/t to Ozempic . She has occasional loose stools; overall symptoms improved. Advise to decrease ozempic  to 0.25mg . She will monitor FBG at home and will call us  for readings > 200. Consider dose escalation if GI symptoms completely resolve.

## 2023-06-14 NOTE — Assessment & Plan Note (Signed)
 Pending multiple myeloma screen due to family history. No overt symptoms at this time.

## 2023-06-14 NOTE — Assessment & Plan Note (Addendum)
 Presentation c/w GERD. Advised to start OTC pepcid in addition to nexium  40mg  qam.

## 2023-06-20 LAB — MULTIPLE MYELOMA PANEL, SERUM
Albumin SerPl Elph-Mcnc: 3.6 g/dL (ref 2.9–4.4)
Albumin/Glob SerPl: 1.3 (ref 0.7–1.7)
Alpha 1: 0.2 g/dL (ref 0.0–0.4)
Alpha2 Glob SerPl Elph-Mcnc: 0.8 g/dL (ref 0.4–1.0)
B-Globulin SerPl Elph-Mcnc: 0.9 g/dL (ref 0.7–1.3)
Gamma Glob SerPl Elph-Mcnc: 0.9 g/dL (ref 0.4–1.8)
Globulin, Total: 2.8 g/dL (ref 2.2–3.9)
IgA/Immunoglobulin A, Serum: 111 mg/dL (ref 64–422)
IgG (Immunoglobin G), Serum: 901 mg/dL (ref 586–1602)
IgM (Immunoglobulin M), Srm: 72 mg/dL (ref 26–217)

## 2023-06-20 LAB — IFE AND PE, RANDOM URINE
% BETA, Urine: 24.3 %
ALBUMIN, U: 45.6 %
ALPHA 1 URINE: 4.8 %
ALPHA-2-GLOBULIN, U: 16.4 %
GAMMA GLOBULIN URINE: 9 %
Protein, Ur: 9.1 mg/dL

## 2023-06-20 LAB — PE AND FLC, SERUM
Ig Kappa Free Light Chain: 22.9 mg/L — ABNORMAL HIGH (ref 3.3–19.4)
Ig Lambda Free Light Chain: 13.7 mg/L (ref 5.7–26.3)
KAPPA/LAMBDA RATIO: 1.67 — ABNORMAL HIGH (ref 0.26–1.65)
Total Protein: 6.4 g/dL (ref 6.0–8.5)

## 2023-06-25 ENCOUNTER — Telehealth: Payer: Self-pay

## 2023-06-25 NOTE — Telephone Encounter (Signed)
 noted

## 2023-06-25 NOTE — Telephone Encounter (Signed)
 LMTCB. Need to let pt know that we have received her patient assistance medication in the office and it is ready for pick up.  Ozempic  0.25/0.5 mg: 4 boxes

## 2023-06-25 NOTE — Telephone Encounter (Signed)
 Copied from CRM [id number]. Topic: General - Other >> Jun 25, 2023 10:32 AM [place] E wrote: Reason for CRM: Patient was returning a call from the office, relayed message that was left in patient's chart regarding her medication pick up. Patient stated she will come by next week to pick up that Ozempic .

## 2023-07-02 ENCOUNTER — Ambulatory Visit
Admission: RE | Admit: 2023-07-02 | Discharge: 2023-07-02 | Discharge status: Home / Self Care | Source: Ambulatory Visit | Attending: Internal Medicine | Admitting: Internal Medicine

## 2023-07-02 ENCOUNTER — Ambulatory Visit

## 2023-07-02 ENCOUNTER — Telehealth: Payer: Self-pay

## 2023-07-02 DIAGNOSIS — Z1231 Encounter for screening mammogram for malignant neoplasm of breast: Secondary | ICD-10-CM | POA: Diagnosis not present

## 2023-07-02 NOTE — Telephone Encounter (Signed)
 Pt came into office and picked up pt assistance medication on 07/02/23 @ 12 pm Ozempic  4 boxes

## 2023-07-10 ENCOUNTER — Ambulatory Visit: Payer: Self-pay | Admitting: Family

## 2023-07-25 ENCOUNTER — Ambulatory Visit: Payer: Medicare Other | Admitting: Internal Medicine

## 2023-07-31 ENCOUNTER — Other Ambulatory Visit: Payer: Self-pay | Admitting: Internal Medicine

## 2023-08-09 ENCOUNTER — Ambulatory Visit (INDEPENDENT_AMBULATORY_CARE_PROVIDER_SITE_OTHER): Admitting: Internal Medicine

## 2023-08-09 ENCOUNTER — Encounter: Payer: Self-pay | Admitting: Internal Medicine

## 2023-08-09 VITALS — BP 112/66 | HR 79 | Temp 98.3°F | Resp 20 | Ht 71.0 in | Wt 205.5 lb

## 2023-08-09 DIAGNOSIS — K869 Disease of pancreas, unspecified: Secondary | ICD-10-CM

## 2023-08-09 DIAGNOSIS — R197 Diarrhea, unspecified: Secondary | ICD-10-CM

## 2023-08-09 DIAGNOSIS — R110 Nausea: Secondary | ICD-10-CM | POA: Diagnosis not present

## 2023-08-09 DIAGNOSIS — E119 Type 2 diabetes mellitus without complications: Secondary | ICD-10-CM

## 2023-08-09 DIAGNOSIS — N838 Other noninflammatory disorders of ovary, fallopian tube and broad ligament: Secondary | ICD-10-CM

## 2023-08-09 DIAGNOSIS — E785 Hyperlipidemia, unspecified: Secondary | ICD-10-CM | POA: Diagnosis not present

## 2023-08-09 DIAGNOSIS — D472 Monoclonal gammopathy: Secondary | ICD-10-CM

## 2023-08-09 DIAGNOSIS — K760 Fatty (change of) liver, not elsewhere classified: Secondary | ICD-10-CM | POA: Diagnosis not present

## 2023-08-09 DIAGNOSIS — Z7985 Long-term (current) use of injectable non-insulin antidiabetic drugs: Secondary | ICD-10-CM | POA: Diagnosis not present

## 2023-08-09 MED ORDER — BUPROPION HCL ER (XL) 300 MG PO TB24: 300.0000 mg | ORAL_TABLET | Freq: Every day | ORAL | 2 refills | Status: DC

## 2023-08-09 MED ORDER — GABAPENTIN 300 MG PO CAPS: ORAL_CAPSULE | ORAL | 1 refills | Status: DC

## 2023-08-09 MED ORDER — ESCITALOPRAM OXALATE 10 MG PO TABS: 10.0000 mg | ORAL_TABLET | Freq: Every day | ORAL | 0 refills | Status: DC

## 2023-08-09 NOTE — Assessment & Plan Note (Signed)
 Concerning for AL amyloid .  Referring to hematology for evaluation;  M M screen done due to FH of MM

## 2023-08-09 NOTE — Assessment & Plan Note (Signed)
 Accompanied by 2 days of green loose stools following each ozempic  dose.  Will schedule labs on day 1 post dose to include lipase

## 2023-08-09 NOTE — Patient Instructions (Signed)
 OZEMPIC  PENS CAN BE MANIPULATED INTO DELIVERING LOWER DOSES BY COUNTING CLICKS   THE 4 MG PEN  (TEAL( THAT DELIVERS THE 1 MG WEEKLY DOSE:  19 CLICKS    0.25 MG DOSE 37 CLICKS    0.50 MG DOSE

## 2023-08-09 NOTE — Progress Notes (Unsigned)
 Subjective:  Patient ID: Dana Burke, female    DOB: [birth year]  Age: 78 y.o. MRN: [id number]  CC: The primary encounter diagnosis was Diabetes mellitus treated with injections of non-insulin medication (HCC). Diagnoses of Hyperlipidemia with target low density lipoprotein (LDL) cholesterol less than 100 mg/dL, Nausea, IgG gammopathy, Ovarian mass, Lesion of pancreas, Diarrhea, unspecified type, and Hepatic steatosis were also pertinent to this visit.   HPI [place] Akansha Wyche presents for  Chief Complaint  Patient presents with   Diabetes    3 month follow up   TYPE 2 DM:   She  feels generally well,  but is not  exercising regularly or trying to lose weight..  Taking   medications as directed. Following a carbohydrate modified diet 6 days per week. Denies numbness, burning and tingling of extremities. Appetite is good.    she was seen  by Rollene  in May ;  advised to reduce OZEMPIC  DOSE to 0.25 MG DUE TO SIDE EFFECTS; however she has not been able to do so because she  did not know how to convert the 1 mg pen to the lower dose.  She has had  increased symptoms of GERD and loose stools that persist for 24-48 hours following ozempic   dosing . Stools are dark green for 2 days following each injection.  Last dose was Saturday   Proteinuria : noted during ER follow up from ER visit May 19 for dehydration secondary to diarrhea of 48 hours (attributed to viral illness. .  Seen  06/29/23 Brother had recent died from MM.  Screening was negative for M Spike but positive for mildly elevated IgGKappa Free light chain (serum) and kappa/lamba ratio which raises possibility of amyloidosisi    Outpatient Medications Prior to Visit  Medication Sig Dispense Refill   acetaminophen  (TYLENOL ) 325 MG tablet Take by mouth.     albuterol  (VENTOLIN  HFA) 108 (90 Base) MCG/ACT inhaler Inhale into the lungs every 6 (six) hours as needed for wheezing or shortness of breath.     Blood Glucose Monitoring Suppl  (ONETOUCH VERIO) w/Device KIT Use as instructed to check blood glucose once daily as needed. 1 kit 0   cetirizine (ZYRTEC) 10 MG tablet Take 10 mg by mouth at bedtime. As needed     Cholecalciferol (VITAMIN D -3) 1000 UNITS CAPS Take 1 capsule by mouth daily. (Patient taking differently: Take 1 capsule by mouth as needed.)     Docusate Sodium  (COLACE PO) Take 1 capsule by mouth daily. (Patient taking differently: Take 1 capsule by mouth as needed.)     esomeprazole  (NEXIUM ) 40 MG capsule TAKE 1 CAPSULE BY MOUTH ONCE DAILY AT 12NOON 90 capsule 3   glucose blood test strip Use as instructed to check blood glucose once daily as needed 50 each 12   Lancets MISC Use to check blood glucose once daily as needed 100 each 11   Magnesium  Citrate 125 MG CAPS Take 2 capsules by mouth every evening. (Patient taking differently: Take 2 capsules by mouth as needed.) 180 capsule 2   Melatonin 10 MG TABS Take 1 tablet by mouth.     Multiple Vitamins-Minerals (MULTIVITAMIN WITH MINERALS) tablet Take 1 tablet by mouth daily.     nitroGLYCERIN  (NITROSTAT ) 0.4 MG SL tablet Place 1 tablet (0.4 mg total) under the tongue every 5 (five) minutes as needed for chest pain. 50 tablet 3   ondansetron  (ZOFRAN ) 4 MG tablet Take 1 tablet (4 mg total) by mouth every  8 (eight) hours as needed. 15 tablet 0   Semaglutide ,0.25 or 0.5MG /DOS, (OZEMPIC , 0.25 OR 0.5 MG/DOSE,) 2 MG/1.5ML SOPN Inject 0.25 mg into the skin once a week. (Patient taking differently: Inject 0.5 mg into the skin once a week.)     buPROPion  (WELLBUTRIN  XL) 300 MG 24 hr tablet Take 1 tablet (300 mg total) by mouth daily. 90 tablet 2   escitalopram  (LEXAPRO ) 10 MG tablet TAKE 1 TABLET BY MOUTH ONCE DAILY 90 tablet 0   gabapentin  (NEURONTIN ) 300 MG capsule TAKE 1 CAPSULE BY MOUTH 4 TIMES DAILY 360 capsule 1   No facility-administered medications prior to visit.    Review of Systems;  Patient denies headache, fevers, malaise, unintentional weight loss, skin rash,  eye pain, sinus congestion and sinus pain, sore throat, dysphagia,  hemoptysis , cough, dyspnea, wheezing, chest pain, palpitations, orthopnea, edema, abdominal pain, nausea, melena, diarrhea, constipation, flank pain, dysuria, hematuria, urinary  Frequency, nocturia, numbness, tingling, seizures,  Focal weakness, Loss of consciousness,  Tremor, insomnia, depression, anxiety, and suicidal ideation.      Objective:  BP 112/66   Pulse 79   Temp 98.3 F (36.8 C)   Resp 20   Ht 5' 11 (1.803 m)   Wt 205 lb 8 oz (93.2 kg)   SpO2 98%   BMI 28.66 kg/m   BP Readings from Last 3 Encounters:  08/09/23 112/66  06/14/23 106/70  06/03/23 123/76    Wt Readings from Last 3 Encounters:  08/09/23 205 lb 8 oz (93.2 kg)  06/14/23 205 lb (93 kg)  06/03/23 205 lb (93 kg)    Physical Exam  Lab Results  Component Value Date   HGBA1C 5.5 04/22/2023   HGBA1C 6.2 01/07/2023   HGBA1C 11.8 (A) 10/04/2022    Lab Results  Component Value Date   CREATININE 0.99 06/14/2023   CREATININE 1.15 (H) 06/03/2023   CREATININE 0.91 04/22/2023    Lab Results  Component Value Date   WBC 6.1 06/14/2023   HGB 14.1 06/14/2023   HCT 41.9 06/14/2023   PLT 247.0 06/14/2023   GLUCOSE 85 06/14/2023   CHOL 176 04/22/2023   TRIG 126.0 04/22/2023   HDL 32.90 (L) 04/22/2023   LDLDIRECT 132.0 04/22/2023   LDLCALC 118 (H) 04/22/2023   ALT 15 06/14/2023   AST 17 06/14/2023   NA 140 06/14/2023   K 4.5 06/14/2023   CL 104 06/14/2023   CREATININE 0.99 06/14/2023   BUN 15 06/14/2023   CO2 30 06/14/2023   TSH 1.07 06/14/2023   HGBA1C 5.5 04/22/2023    MM 3D SCREENING MAMMOGRAM BILATERAL BREAST Result Date: 07/04/2023 CLINICAL DATA:  Screening. EXAM: DIGITAL SCREENING BILATERAL MAMMOGRAM WITH TOMOSYNTHESIS AND CAD TECHNIQUE: Bilateral screening digital craniocaudal and mediolateral oblique mammograms were obtained. Bilateral screening digital breast tomosynthesis was performed. The images were evaluated with  computer-aided detection. COMPARISON:  Previous exam(s). ACR Breast Density Category b: There are scattered areas of fibroglandular density. FINDINGS: There are no findings suspicious for malignancy. IMPRESSION: No mammographic evidence of malignancy. A result letter of this screening mammogram will be mailed directly to the patient. RECOMMENDATION: Screening mammogram in one year. (Code:SM-B-01Y) BI-RADS CATEGORY  1: Negative. Electronically Signed   By: Alm Parkins M.D.   On: 07/04/2023 10:55    Assessment & Plan:  .Diabetes mellitus treated with injections of non-insulin medication (HCC) Assessment & Plan: Patient will use the conversion table to lower her dose of ozempic  to 0.25 mg using the 1 mg dose.  She  will return for labs including lipase after her next injection  Orders: -     Hemoglobin A1c; Future -     Comprehensive metabolic panel with GFR; Future -     Microalbumin / creatinine urine ratio; Future  Hyperlipidemia with target low density lipoprotein (LDL) cholesterol less than 100 mg/dL -     Lipid panel; Future -     LDL cholesterol, direct; Future  Nausea Assessment & Plan: Accompanied by 2 days of green loose stools following each ozempic  dose.  Will schedule labs on day 1 post dose to include lipase   Orders: -     Lipase; Future  IgG gammopathy Assessment & Plan: Concerning for AL amyloid .  Referring to hematology for evaluation;  M M screen done due to FH of MM   Orders: -     Ambulatory referral to Hematology / Oncology  Ovarian mass  Lesion of pancreas Assessment & Plan: MRCP Done jan 2025 noted Tiny cystic lesion in the mid body of the pancreas is not changed from comparison exam. Favor small side branch IPMN. No follow-up recommended.   Diarrhea, unspecified type Assessment & Plan: Resolved,  attributed to viral illness.    Hepatic steatosis Assessment & Plan: Not seen on recent MRCP.   Continue Low GI diet,  Weight loss,  Vaccination status  against Hep a /B  still needed (ab titers were negative June 202  Lab Results  Component Value Date   ALT 15 06/14/2023   AST 17 06/14/2023   ALKPHOS 80 06/14/2023   BILITOT 0.4 06/14/2023   3)   Other orders -     buPROPion  HCl ER (XL); Take 1 tablet (300 mg total) by mouth daily.  Dispense: 90 each; Refill: 2 -     Escitalopram  Oxalate; Take 1 tablet (10 mg total) by mouth daily.  Dispense: 90 each; Refill: 0 -     Gabapentin ; TAKE 1 CAPSULE BY MOUTH 4 TIMES DAILY  Dispense: 1157.143 each; Refill: 1     I spent 34 minutes on the day of this face to face encounter reviewing patient's  most recent  ER visit ,  prior relevant surgical and non surgical procedures, recent  labs and imaging studies, counseling on diabetes management,  reviewing the assessment and plan with patient, and post visit ordering and reviewing of  diagnostics and therapeutics with patient  .   Follow-up: Return in about 6 months (around 02/09/2024).   Verneita LITTIE Kettering, MD

## 2023-08-10 NOTE — Progress Notes (Incomplete)
 Subjective:  Patient ID: Dana Burke, female    DOB: [birth year]  Age: 78 y.o. MRN: [id number]  CC: The primary encounter diagnosis was Diabetes mellitus treated with injections of non-insulin medication (HCC). Diagnoses of Hyperlipidemia with target low density lipoprotein (LDL) cholesterol less than 100 mg/dL, Nausea, IgG gammopathy, Ovarian mass, Lesion of pancreas, Diarrhea, unspecified type, and Hepatic steatosis were also pertinent to this visit.   HPI Dana Burke presents for  Chief Complaint  Patient presents with   Diabetes    3 month follow up   TYPE 2 DM:   She  feels generally well,  but is not  exercising regularly or trying to lose weight..  Taking   medications as directed. Following a carbohydrate modified diet 6 days per week. Denies numbness, burning and tingling of extremities. Appetite is good.    she was seen  by Rollene  in May ;  advised to reduce OZEMPIC  DOSE to 0.25 MG DUE TO SIDE EFFECTS; however she has not been able to do so because she  did not know how to convert the 1 mg pen to the lower dose.  She has had  increased symptoms of GERD and loose stools that persist for 24-48 hours following ozempic   dosing . Stools are dark green for 2 days following each injection.  Last dose was Saturday   Proteinuria : noted during ER follow up from ER visit May 19 for dehydration secondary to diarrhea of 48 hours (attributed to viral illness. .  Seen  06/29/23 Brother had recent died from MM.  Screening was negative for M Spike but positive for mildly elevated IgGKappa Free light chain (serum) and kappa/lamba ratio which raises possibility of amyloidosisi    Outpatient Medications Prior to Visit  Medication Sig Dispense Refill   acetaminophen  (TYLENOL ) 325 MG tablet Take by mouth.     albuterol  (VENTOLIN  HFA) 108 (90 Base) MCG/ACT inhaler Inhale into the lungs every 6 (six) hours as needed for wheezing or shortness of breath.     Blood Glucose Monitoring Suppl  (ONETOUCH VERIO) w/Device KIT Use as instructed to check blood glucose once daily as needed. 1 kit 0   cetirizine (ZYRTEC) 10 MG tablet Take 10 mg by mouth at bedtime. As needed     Cholecalciferol (VITAMIN D -3) 1000 UNITS CAPS Take 1 capsule by mouth daily. (Patient taking differently: Take 1 capsule by mouth as needed.)     Docusate Sodium  (COLACE PO) Take 1 capsule by mouth daily. (Patient taking differently: Take 1 capsule by mouth as needed.)     esomeprazole  (NEXIUM ) 40 MG capsule TAKE 1 CAPSULE BY MOUTH ONCE DAILY AT 12NOON 90 capsule 3   glucose blood test strip Use as instructed to check blood glucose once daily as needed 50 each 12   Lancets MISC Use to check blood glucose once daily as needed 100 each 11   Magnesium  Citrate 125 MG CAPS Take 2 capsules by mouth every evening. (Patient taking differently: Take 2 capsules by mouth as needed.) 180 capsule 2   Melatonin 10 MG TABS Take 1 tablet by mouth.     Multiple Vitamins-Minerals (MULTIVITAMIN WITH MINERALS) tablet Take 1 tablet by mouth daily.     nitroGLYCERIN  (NITROSTAT ) 0.4 MG SL tablet Place 1 tablet (0.4 mg total) under the tongue every 5 (five) minutes as needed for chest pain. 50 tablet 3   ondansetron  (ZOFRAN ) 4 MG tablet Take 1 tablet (4 mg total) by mouth every  8 (eight) hours as needed. 15 tablet 0   Semaglutide ,0.25 or 0.5MG /DOS, (OZEMPIC , 0.25 OR 0.5 MG/DOSE,) 2 MG/1.5ML SOPN Inject 0.25 mg into the skin once a week. (Patient taking differently: Inject 0.5 mg into the skin once a week.)     buPROPion  (WELLBUTRIN  XL) 300 MG 24 hr tablet Take 1 tablet (300 mg total) by mouth daily. 90 tablet 2   escitalopram  (LEXAPRO ) 10 MG tablet TAKE 1 TABLET BY MOUTH ONCE DAILY 90 tablet 0   gabapentin  (NEURONTIN ) 300 MG capsule TAKE 1 CAPSULE BY MOUTH 4 TIMES DAILY 360 capsule 1   No facility-administered medications prior to visit.    Review of Systems;  Patient denies headache, fevers, malaise, unintentional weight loss, skin rash,  eye pain, sinus congestion and sinus pain, sore throat, dysphagia,  hemoptysis , cough, dyspnea, wheezing, chest pain, palpitations, orthopnea, edema, abdominal pain, nausea, melena, diarrhea, constipation, flank pain, dysuria, hematuria, urinary  Frequency, nocturia, numbness, tingling, seizures,  Focal weakness, Loss of consciousness,  Tremor, insomnia, depression, anxiety, and suicidal ideation.      Objective:  BP 112/66   Pulse 79   Temp 98.3 F (36.8 C)   Resp 20   Ht 5' 11 (1.803 m)   Wt 205 lb 8 oz (93.2 kg)   SpO2 98%   BMI 28.66 kg/m   BP Readings from Last 3 Encounters:  08/09/23 112/66  06/14/23 106/70  06/03/23 123/76    Wt Readings from Last 3 Encounters:  08/09/23 205 lb 8 oz (93.2 kg)  06/14/23 205 lb (93 kg)  06/03/23 205 lb (93 kg)    Physical Exam  Lab Results  Component Value Date   HGBA1C 5.5 04/22/2023   HGBA1C 6.2 01/07/2023   HGBA1C 11.8 (A) 10/04/2022    Lab Results  Component Value Date   CREATININE 0.99 06/14/2023   CREATININE 1.15 (H) 06/03/2023   CREATININE 0.91 04/22/2023    Lab Results  Component Value Date   WBC 6.1 06/14/2023   HGB 14.1 06/14/2023   HCT 41.9 06/14/2023   PLT 247.0 06/14/2023   GLUCOSE 85 06/14/2023   CHOL 176 04/22/2023   TRIG 126.0 04/22/2023   HDL 32.90 (L) 04/22/2023   LDLDIRECT 132.0 04/22/2023   LDLCALC 118 (H) 04/22/2023   ALT 15 06/14/2023   AST 17 06/14/2023   NA 140 06/14/2023   K 4.5 06/14/2023   CL 104 06/14/2023   CREATININE 0.99 06/14/2023   BUN 15 06/14/2023   CO2 30 06/14/2023   TSH 1.07 06/14/2023   HGBA1C 5.5 04/22/2023    MM 3D SCREENING MAMMOGRAM BILATERAL BREAST Result Date: 07/04/2023 CLINICAL DATA:  Screening. EXAM: DIGITAL SCREENING BILATERAL MAMMOGRAM WITH TOMOSYNTHESIS AND CAD TECHNIQUE: Bilateral screening digital craniocaudal and mediolateral oblique mammograms were obtained. Bilateral screening digital breast tomosynthesis was performed. The images were evaluated with  computer-aided detection. COMPARISON:  Previous exam(s). ACR Breast Density Category b: There are scattered areas of fibroglandular density. FINDINGS: There are no findings suspicious for malignancy. IMPRESSION: No mammographic evidence of malignancy. A result letter of this screening mammogram will be mailed directly to the patient. RECOMMENDATION: Screening mammogram in one year. (Code:SM-[id number]) BI-RADS CATEGORY  1: Negative. Electronically Signed   By: Alm Parkins M.D.   On: 07/04/2023 10:55    Assessment & Plan:  .Diabetes mellitus treated with injections of non-insulin medication (HCC) Assessment & Plan: Patient will use the conversion table to lower her dose of ozempic  to 0.25 mg using the 1 mg dose.  She  will return for labs including lipase after her next injection  Orders: -     Hemoglobin A1c; Future -     Comprehensive metabolic panel with GFR; Future -     Microalbumin / creatinine urine ratio; Future  Hyperlipidemia with target low density lipoprotein (LDL) cholesterol less than 100 mg/dL -     Lipid panel; Future -     LDL cholesterol, direct; Future  Nausea Assessment & Plan: Accompanied by 2 days of green loose stools following each ozempic  dose.  Will schedule labs on day 1 post dose to include lipase   Orders: -     Lipase; Future  IgG gammopathy Assessment & Plan: Concerning for AL amyloid .  Referring to hematology for evaluation;  M M screen done due to FH of MM   Orders: -     Ambulatory referral to Hematology / Oncology  Ovarian mass  Lesion of pancreas Assessment & Plan: MRCP Done jan 2025 noted Tiny cystic lesion in the mid body of the pancreas is not changed from comparison exam. Favor small side branch IPMN. No follow-up recommended.   Diarrhea, unspecified type Assessment & Plan: Resolved,  attributed to viral illness.    Hepatic steatosis Assessment & Plan: Not seen on recent MRCP.   Continue Low GI diet,  Weight loss,  Vaccination status  against Hep a /B  still needed (ab titers were negative June 202  Lab Results  Component Value Date   ALT 15 06/14/2023   AST 17 06/14/2023   ALKPHOS 80 06/14/2023   BILITOT 0.4 06/14/2023   3)   Other orders -     buPROPion  HCl ER (XL); Take 1 tablet (300 mg total) by mouth daily.  Dispense: 90 each; Refill: 2 -     Escitalopram  Oxalate; Take 1 tablet (10 mg total) by mouth daily.  Dispense: 90 each; Refill: 0 -     Gabapentin ; TAKE 1 CAPSULE BY MOUTH 4 TIMES DAILY  Dispense: 1157.143 each; Refill: 1     I spent 34 minutes on the day of this face to face encounter reviewing patient's  most recent  ER visit ,  prior relevant surgical and non surgical procedures, recent  labs and imaging studies, counseling on diabetes management,  reviewing the assessment and plan with patient, and post visit ordering and reviewing of  diagnostics and therapeutics with patient  .   Follow-up: Return in about 6 months (around 02/09/2024).   Verneita LITTIE Kettering, MD

## 2023-08-11 NOTE — Assessment & Plan Note (Addendum)
 MRCP Done jan 2025 noted Tiny cystic lesion in the mid body of the pancreas is not changed from comparison exam. Favor small side branch IPMN. No follow-up recommended.

## 2023-08-11 NOTE — Assessment & Plan Note (Signed)
 Not seen on recent MRCP.   Continue Low GI diet,  Weight loss,  Vaccination status against Hep a /B  still needed (ab titers were negative June 202  Lab Results  Component Value Date   ALT 15 06/14/2023   AST 17 06/14/2023   ALKPHOS 80 06/14/2023   BILITOT 0.4 06/14/2023   3)

## 2023-08-11 NOTE — Assessment & Plan Note (Signed)
 Patient will use the conversion table to lower her dose of ozempic  to 0.25 mg using the 1 mg dose.  She will return for labs including lipase after her next injection

## 2023-08-11 NOTE — Assessment & Plan Note (Signed)
 Resolved,  attributed to viral illness.

## 2023-08-12 ENCOUNTER — Encounter: Payer: Self-pay | Admitting: Internal Medicine

## 2023-08-12 ENCOUNTER — Other Ambulatory Visit (INDEPENDENT_AMBULATORY_CARE_PROVIDER_SITE_OTHER)

## 2023-08-12 DIAGNOSIS — R110 Nausea: Secondary | ICD-10-CM | POA: Diagnosis not present

## 2023-08-12 DIAGNOSIS — Z7985 Long-term (current) use of injectable non-insulin antidiabetic drugs: Secondary | ICD-10-CM | POA: Diagnosis not present

## 2023-08-12 DIAGNOSIS — E785 Hyperlipidemia, unspecified: Secondary | ICD-10-CM | POA: Diagnosis not present

## 2023-08-12 DIAGNOSIS — E119 Type 2 diabetes mellitus without complications: Secondary | ICD-10-CM | POA: Diagnosis not present

## 2023-08-12 NOTE — Addendum Note (Signed)
 Addended by: MARYLEN PRO A on: 08/12/2023 10:49 AM   Modules accepted: Orders

## 2023-08-15 ENCOUNTER — Ambulatory Visit: Payer: Self-pay | Admitting: Internal Medicine

## 2023-08-15 LAB — LIPID PANEL
Chol/HDL Ratio: 4.9 ratio — ABNORMAL HIGH (ref 0.0–4.4)
Cholesterol, Total: 186 mg/dL (ref 100–199)
HDL: 38 mg/dL — ABNORMAL LOW (ref >39)
LDL Chol Calc (NIH): 126 mg/dL — ABNORMAL HIGH (ref 0–99)
Triglycerides: 123 mg/dL (ref 0–149)
VLDL Cholesterol Cal: 22 mg/dL (ref 5–40)

## 2023-08-15 LAB — COMPREHENSIVE METABOLIC PANEL WITH GFR
ALT: 13 IU/L (ref 0–32)
AST: 15 IU/L (ref 0–40)
Albumin: 4.2 g/dL (ref 3.8–4.8)
Alkaline Phosphatase: 99 IU/L (ref 44–121)
BUN/Creatinine Ratio: 13 (ref 12–28)
BUN: 12 mg/dL (ref 8–27)
Bilirubin Total: 0.3 mg/dL (ref 0.0–1.2)
CO2: 24 mmol/L (ref 20–29)
Calcium: 10.5 mg/dL — ABNORMAL HIGH (ref 8.7–10.3)
Chloride: 104 mmol/L (ref 96–106)
Creatinine, Ser: 0.96 mg/dL (ref 0.57–1.00)
Globulin, Total: 2.3 g/dL (ref 1.5–4.5)
Glucose: 78 mg/dL (ref 70–99)
Potassium: 5.1 mmol/L (ref 3.5–5.2)
Sodium: 141 mmol/L (ref 134–144)
Total Protein: 6.5 g/dL (ref 6.0–8.5)
eGFR: 60 mL/min/1.73 (ref >59)

## 2023-08-15 LAB — HEMOGLOBIN A1C
Est. average glucose Bld gHb Est-mCnc: 105 mg/dL
Hgb A1c MFr Bld: 5.3 % (ref 4.8–5.6)

## 2023-08-15 LAB — MICROALBUMIN / CREATININE URINE RATIO
Creatinine, Urine: 73.7 mg/dL
Microalb/Creat Ratio: 11 mg/g{creat} (ref 0–29)
Microalbumin, Urine: 8 ug/mL

## 2023-08-15 LAB — LIPASE: Lipase: 43 U/L (ref 14–85)

## 2023-08-15 LAB — LDL CHOLESTEROL, DIRECT: LDL Direct: 124 mg/dL — ABNORMAL HIGH (ref 0–99)

## 2023-08-26 ENCOUNTER — Telehealth: Payer: Self-pay

## 2023-08-26 NOTE — Telephone Encounter (Signed)
 Received 4 boxes of Ozempic  pot notified.  4mg /4ml lot: [id number] exp:10/14/25

## 2023-08-28 ENCOUNTER — Inpatient Hospital Stay

## 2023-08-28 ENCOUNTER — Other Ambulatory Visit: Payer: Self-pay

## 2023-08-28 ENCOUNTER — Encounter: Payer: Self-pay | Admitting: Oncology

## 2023-08-28 ENCOUNTER — Inpatient Hospital Stay: Attending: Oncology | Admitting: Oncology

## 2023-08-28 VITALS — BP 145/80 | HR 78 | Temp 96.3°F | Resp 18 | Wt 207.8 lb

## 2023-08-28 DIAGNOSIS — Z853 Personal history of malignant neoplasm of breast: Secondary | ICD-10-CM | POA: Diagnosis not present

## 2023-08-28 DIAGNOSIS — R768 Other specified abnormal immunological findings in serum: Secondary | ICD-10-CM

## 2023-08-28 DIAGNOSIS — L570 Actinic keratosis: Secondary | ICD-10-CM | POA: Insufficient documentation

## 2023-08-28 DIAGNOSIS — Z7985 Long-term (current) use of injectable non-insulin antidiabetic drugs: Secondary | ICD-10-CM | POA: Insufficient documentation

## 2023-08-28 DIAGNOSIS — Z809 Family history of malignant neoplasm, unspecified: Secondary | ICD-10-CM

## 2023-08-28 DIAGNOSIS — Z800 Family history of malignant neoplasm of digestive organs: Secondary | ICD-10-CM | POA: Diagnosis not present

## 2023-08-28 DIAGNOSIS — Z79899 Other long term (current) drug therapy: Secondary | ICD-10-CM | POA: Diagnosis not present

## 2023-08-28 DIAGNOSIS — Z8052 Family history of malignant neoplasm of bladder: Secondary | ICD-10-CM | POA: Diagnosis not present

## 2023-08-28 DIAGNOSIS — Z803 Family history of malignant neoplasm of breast: Secondary | ICD-10-CM | POA: Insufficient documentation

## 2023-08-28 DIAGNOSIS — Z86711 Personal history of pulmonary embolism: Secondary | ICD-10-CM | POA: Diagnosis not present

## 2023-08-28 DIAGNOSIS — E119 Type 2 diabetes mellitus without complications: Secondary | ICD-10-CM | POA: Diagnosis not present

## 2023-08-28 DIAGNOSIS — Z87891 Personal history of nicotine dependence: Secondary | ICD-10-CM | POA: Diagnosis not present

## 2023-08-28 DIAGNOSIS — E8581 Light chain (AL) amyloidosis: Secondary | ICD-10-CM | POA: Diagnosis not present

## 2023-08-28 DIAGNOSIS — Z806 Family history of leukemia: Secondary | ICD-10-CM | POA: Insufficient documentation

## 2023-08-28 DIAGNOSIS — E785 Hyperlipidemia, unspecified: Secondary | ICD-10-CM | POA: Insufficient documentation

## 2023-08-28 NOTE — Assessment & Plan Note (Signed)
 Mildly elevated kappa free light chain, and mildly elevated light chain ratio. This is non specific.  No M protein on SPEP.  She has no clinic signs of amyloidosis.  Recommend 24 hour UPEP.  If negative. I recommend repeat testing in 6 months.

## 2023-08-28 NOTE — Telephone Encounter (Signed)
 Patient has picked up medication

## 2023-08-28 NOTE — Progress Notes (Signed)
 Hematology/Oncology Consult Note [phone] [phone]    Patient Care Team: Marylynn Verneita CROME, MD as PCP - General (Internal Medicine) Anner Alm ORN, MD as PCP - Cardiology (Cardiology) Byrnett, Reyes ORN, MD (General Surgery) Marylynn Verneita CROME, MD (Internal Medicine) Babara Call, MD as Consulting Physician (Oncology)  REFERRING PROVIDER: Marylynn Verneita CROME, MD  CHIEF COMPLAINTS/REASON FOR VISIT:  Evaluation of abnormal SPEP  ASSESSMENT & PLAN:   Elevated serum immunoglobulin free light chains Mildly elevated kappa free light chain, and mildly elevated light chain ratio. This is non specific.  No M protein on SPEP.  She has no clinic signs of amyloidosis.  Recommend 24 hour UPEP.  If negative. I recommend repeat testing in 6 months.   Family history of cancer Discussed option of genetic testing. She declined.   Hypercalcemia Could be due to her dehydration episode.  Check PTH and calcium .    Orders Placed This Encounter  Procedures   Parathyroid hormone,  intact and calcium     Standing Status:   Future    Number of Occurrences:   1    Expected Date:   08/28/2023    Expiration Date:   08/27/2024   Follow up in 6 months.  All questions were answered. The patient knows to call the clinic with any problems, questions or concerns.  Call Babara, MD, PhD [facility] Hematology Oncology 08/28/2023    HISTORY OF PRESENTING ILLNESS:  Dana Burke is a 78 y.o. female who was seen in consultation at the request of Marylynn Verneita CROME, MD for evaluation of abnormal SPEP results.   Discussed the use of AI scribe software for clinical note transcription with the patient, who gave verbal consent to proceed.   Patient recently had work up done Dr. Lula office. Labs reviewed,  SPEP showed no M spike. She has mildly increased free light chain 22.9, slightly elevated light chain ratio of 1.67. random UPEP negative for M protein  She denies history of abnormal  bone pain or bone fracture. Patient denies history of recurrent infection. Denies chills, night sweats, anorexia or unintentional weight loss. Denies dizziness, heart failure, edema.   She has a significant family history of cancer, including a brother who was initially diagnosed with multiple myeloma, later changed to lymphoma, and passed away two months after diagnosis. Other family members have had various cancers, including melanoma, breast cancer, and skin cancer. Her niece, at 24 years old, has breast cancer and is participating in a study.  She reports intentional weight loss of 50 pounds due to Ozempic  use, with no unintentional weight loss. No night sweats or fever. She experiences dark green stools, which she questions could be related to Ozempic  or gastrointestinal distress.     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  Left dominant system.  Normal LVEF BiV gram and normal LVEDP.   THYROGLOSSAL DUCT CYST N/A 06/22/2015   Procedure: THYROGLOSSAL DUCT CYST;  Surgeon: Deward Dolly,  MD;  Location: [facility] ORS;  Service: ENT;  Laterality: N/A;   TRANSTHORACIC ECHOCARDIOGRAM  08/2018   EF 60 to 65%.  GR 1 DD.  Normal RV size and function.  Mild LA dilation.  Normal valves.    SOCIAL HISTORY: Social History   Socioeconomic History   Marital status: Widowed    Spouse name: Not on file   Number of children: Not on file   Years of education: Not on file   Highest education level: Not on file  Occupational History   Not on file  Tobacco Use   Smoking status: Former    Current packs/day: 0.00    Average packs/day: 0.4 packs/day for 2.0 years (0.8 ttl pk-yrs)    Types: Cigarettes    Start date: 07/29/1985    Quit date: 07/30/1987    Years since quitting: 36.1   Smokeless tobacco: Never  Vaping Use   Vaping status: Never Used  Substance and Sexual Activity   Alcohol use: No   Drug use: No   Sexual activity: Never  Other Topics Concern   Not on file  Social History Narrative   She is a retired Engineer, civil (consulting).   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 -- Now is helping her brother care for his wife - now on Hospice.   Social Drivers of Corporate investment banker Strain: Low Risk  (08/06/2022)   Overall Financial Resource Strain (CARDIA)    Difficulty of Paying Living Expenses: Not hard at all  Food Insecurity: No Food Insecurity (08/06/2022)   Hunger Vital Sign    Worried About Running Out of Food in the Last Year: Never true    Ran Out of Food in the Last Year: Never true  Transportation Needs: No Transportation Needs (08/06/2022)   PRAPARE - Administrator, Civil Service (Medical): No    Lack of Transportation (Non-Medical): No  Physical Activity: Inactive (08/06/2022)   Exercise Vital Sign    Days of Exercise per Week: 0 days    Minutes of Exercise per Session: 0 min  Stress: No Stress Concern Present (08/06/2022)   [employer] of Occupational Health - Occupational Stress Questionnaire    Feeling of Stress :  Only a little  Social Connections: Moderately Integrated (08/06/2022)   Social Connection and Isolation Panel    Frequency of Communication with Friends and Family: More than three times a week    Frequency of Social Gatherings with Friends and Family: More than three times a week    Attends Religious Services: More than 4 times per year    Active Member of [employer] or Organizations: Yes    Attends Banker Meetings: More than 4 times per year    Marital Status: Widowed  Intimate Partner Violence: Not At Risk (08/06/2022)   Humiliation, Afraid, Rape, and Kick questionnaire    Fear of Current or Ex-Partner: No    Emotionally Abused: No    Physically Abused: No    Sexually  Abused: No    FAMILY HISTORY: Family History  Problem Relation Age of Onset   Heart disease Mother    Hyperlipidemia Mother    Dementia Mother    Hypertension Father    Congestive Heart Failure Father    Hyperlipidemia Father    Diabetes Father    Heart disease Father        Status post pacemaker   Cancer Sister        melanoma   Cancer Sister        breast   Breast cancer Sister        diagnosed 3 times - lastest 15s   Hypertension Brother    Cancer Brother        skin cancer   Bladder Cancer Brother    Diabetes Brother    Cancer Brother        lymphoma   Hypertension Brother    Diabetes Brother    Multiple myeloma Brother 66   Cancer Brother        melanoma   Coronary artery disease Brother 51       4 vessel CABG   Diabetes Brother    Hypertension Paternal Grandmother    Cancer Paternal Uncle        colon   Seizures Grandchild    Breast cancer Cousin    Breast cancer Cousin    Breast cancer Cousin    Breast cancer Cousin    Breast cancer Cousin    Cancer Niece        breast cancer   Breast cancer Niece 90    ALLERGIES:  is allergic to atorvastatin  and metformin  and related.  MEDICATIONS:  Current Outpatient Medications  Medication Sig Dispense Refill   acetaminophen   (TYLENOL ) 325 MG tablet Take by mouth.     albuterol  (VENTOLIN  HFA) 108 (90 Base) MCG/ACT inhaler Inhale into the lungs every 6 (six) hours as needed for wheezing or shortness of breath.     Blood Glucose Monitoring Suppl (ONETOUCH VERIO) w/Device KIT Use as instructed to check blood glucose once daily as needed. 1 kit 0   buPROPion  (WELLBUTRIN  XL) 300 MG 24 hr tablet Take 1 tablet (300 mg total) by mouth daily. 90 each 2   cetirizine (ZYRTEC) 10 MG tablet Take 10 mg by mouth at bedtime. As needed     Cholecalciferol (VITAMIN D -3) 1000 UNITS CAPS Take 1 capsule by mouth daily.     Docusate Sodium  (COLACE PO) Take 1 capsule by mouth daily. (Patient taking differently: Take 1 capsule by mouth as needed.)     escitalopram  (LEXAPRO ) 10 MG tablet Take 1 tablet (10 mg total) by mouth daily. 90 each 0   esomeprazole  (NEXIUM ) 40 MG capsule TAKE 1 CAPSULE BY MOUTH ONCE DAILY AT 12NOON 90 capsule 3   gabapentin  (NEURONTIN ) 300 MG capsule TAKE 1 CAPSULE BY MOUTH 4 TIMES DAILY 1157.143 each 1   glucose blood test strip Use as instructed to check blood glucose once daily as needed 50 each 12   Lancets MISC Use to check blood glucose once daily as needed 100 each 11   Magnesium  Citrate 125 MG CAPS Take 2 capsules by mouth every evening. (Patient taking differently: Take 2 capsules by mouth as needed.) 180 capsule 2   Melatonin 10 MG TABS Take 1 tablet by mouth.     Multiple Vitamins-Minerals (MULTIVITAMIN WITH MINERALS) tablet Take 1 tablet by mouth daily.     nitroGLYCERIN  (NITROSTAT ) 0.4 MG SL tablet Place 1  tablet (0.4 mg total) under the tongue every 5 (five) minutes as needed for chest pain. 50 tablet 3   ondansetron  (ZOFRAN ) 4 MG tablet Take 1 tablet (4 mg total) by mouth every 8 (eight) hours as needed. 15 tablet 0   Semaglutide ,0.25 or 0.5MG /DOS, (OZEMPIC , 0.25 OR 0.5 MG/DOSE,) 2 MG/1.5ML SOPN Inject 0.25 mg into the skin once a week. (Patient taking differently: Inject 0.5 mg into the skin once a week.)      No current facility-administered medications for this visit.    Review of Systems  Constitutional:  Negative for appetite change, chills, fatigue and fever.  HENT:   Negative for hearing loss and voice change.   Eyes:  Negative for eye problems.  Respiratory:  Negative for chest tightness and cough.   Cardiovascular:  Negative for chest pain.  Gastrointestinal:  Negative for abdominal distention, abdominal pain, blood in stool, nausea and vomiting.       Stool is dark green color.   Endocrine: Negative for hot flashes.  Genitourinary:  Negative for difficulty urinating and frequency.   Musculoskeletal:  Negative for arthralgias.  Skin:  Negative for itching and rash.  Neurological:  Negative for extremity weakness.  Hematological:  Negative for adenopathy.  Psychiatric/Behavioral:  Negative for confusion.    PHYSICAL EXAMINATION: ECOG PERFORMANCE STATUS: 0 - Asymptomatic Vitals:   08/28/23 1110  BP: (!) 145/80  Pulse: 78  Resp: 18  Temp: (!) 96.3 F (35.7 C)  SpO2: 97%   Filed Weights   08/28/23 1110  Weight: 207 lb 12.8 oz (94.3 kg)    Physical Exam Constitutional:      General: She is not in acute distress. HENT:     Head: Normocephalic and atraumatic.  Eyes:     General: No scleral icterus. Cardiovascular:     Rate and Rhythm: Normal rate and regular rhythm.     Heart sounds: Normal heart sounds.  Pulmonary:     Effort: Pulmonary effort is normal. No respiratory distress.     Breath sounds: No wheezing.  Abdominal:     General: Bowel sounds are normal. There is no distension.     Palpations: Abdomen is soft.  Musculoskeletal:        General: No deformity. Normal range of motion.     Cervical back: Normal range of motion and neck supple.  Skin:    General: Skin is warm and dry.     Findings: No erythema or rash.  Neurological:     Mental Status: She is alert and oriented to person, place, and time. Mental status is at baseline.  Psychiatric:         Mood and Affect: Mood normal.      LABORATORY DATA:  I have reviewed the data as listed    Latest Ref Rng & Units 06/14/2023   11:18 AM 06/03/2023    5:16 PM 03/15/2022   10:42 AM  CBC  WBC 4.0 - 10.5 K/uL 6.1  10.9  6.4   Hemoglobin 12.0 - 15.0 g/dL 85.8  85.7  86.1   Hematocrit 36.0 - 46.0 % 41.9  43.0  42.0   Platelets 150.0 - 400.0 K/uL 247.0  249  210       Latest Ref Rng & Units 08/12/2023   10:50 AM 06/14/2023   11:18 AM 06/03/2023    5:16 PM  CMP  Glucose 70 - 99 mg/dL 78  85  92   BUN 8 - 27 mg/dL 12  15  23   Creatinine 0.57 - 1.00 mg/dL 9.03  9.00  8.84   Sodium 134 - 144 mmol/L 141  140  136   Potassium 3.5 - 5.2 mmol/L 5.1  4.5  3.8   Chloride 96 - 106 mmol/L 104  104  103   CO2 20 - 29 mmol/L 24  30  25    Calcium  8.7 - 10.3 mg/dL 89.4  89.6  89.6   Total Protein 6.0 - 8.5 g/dL 6.5  7.0    6.4  7.3   Total Bilirubin 0.0 - 1.2 mg/dL 0.3  0.4  0.9   Alkaline Phos 44 - 121 IU/L 99  80  74   AST 0 - 40 IU/L 15  17  21    ALT 0 - 32 IU/L 13  15  18     Lab Results  Component Value Date   MPROTEIN Not Observed 06/14/2023          RADIOGRAPHIC STUDIES: I have personally reviewed the radiological images as listed and agreed with the findings in the report.  No results found.

## 2023-08-28 NOTE — Assessment & Plan Note (Signed)
 Could be due to her dehydration episode.  Check PTH and calcium .

## 2023-08-28 NOTE — Addendum Note (Signed)
 Addended by: HEROLD ALMARIE PARAS on: 08/28/2023 01:06 PM   Modules accepted: Orders

## 2023-08-28 NOTE — Assessment & Plan Note (Signed)
 Discussed option of genetic testing. She declined.

## 2023-08-29 LAB — PTH, INTACT AND CALCIUM
Calcium, Total (PTH): 10.4 mg/dL — ABNORMAL HIGH (ref 8.7–10.3)
PTH: 54 pg/mL (ref 15–65)

## 2023-08-30 ENCOUNTER — Other Ambulatory Visit: Payer: Self-pay

## 2023-08-30 DIAGNOSIS — E119 Type 2 diabetes mellitus without complications: Secondary | ICD-10-CM | POA: Diagnosis not present

## 2023-08-30 DIAGNOSIS — E785 Hyperlipidemia, unspecified: Secondary | ICD-10-CM | POA: Diagnosis not present

## 2023-08-30 DIAGNOSIS — E8581 Light chain (AL) amyloidosis: Secondary | ICD-10-CM | POA: Diagnosis not present

## 2023-08-30 DIAGNOSIS — L570 Actinic keratosis: Secondary | ICD-10-CM | POA: Diagnosis not present

## 2023-08-30 DIAGNOSIS — Z853 Personal history of malignant neoplasm of breast: Secondary | ICD-10-CM | POA: Diagnosis not present

## 2023-08-30 DIAGNOSIS — R768 Other specified abnormal immunological findings in serum: Secondary | ICD-10-CM

## 2023-09-02 LAB — IFE+PROTEIN ELECTRO, 24-HR UR
% BETA, Urine: 0 %
ALPHA 1 URINE: 0 %
Albumin, U: 100 %
Alpha 2, Urine: 0 %
GAMMA GLOBULIN URINE: 0 %
Total Protein, Urine-Ur/day: 108 mg/(24.h) (ref 30–150)
Total Protein, Urine: 8 mg/dL
Total Volume: 1350

## 2023-09-05 ENCOUNTER — Other Ambulatory Visit: Payer: Self-pay | Admitting: Internal Medicine

## 2023-09-18 ENCOUNTER — Ambulatory Visit (INDEPENDENT_AMBULATORY_CARE_PROVIDER_SITE_OTHER): Admitting: Internal Medicine

## 2023-09-18 ENCOUNTER — Encounter: Payer: Self-pay | Admitting: Internal Medicine

## 2023-09-18 VITALS — BP 112/72 | HR 83 | Ht 71.0 in | Wt 207.4 lb

## 2023-09-18 DIAGNOSIS — E785 Hyperlipidemia, unspecified: Secondary | ICD-10-CM

## 2023-09-18 DIAGNOSIS — G3184 Mild cognitive impairment, so stated: Secondary | ICD-10-CM | POA: Diagnosis not present

## 2023-09-18 DIAGNOSIS — Z7985 Long-term (current) use of injectable non-insulin antidiabetic drugs: Secondary | ICD-10-CM | POA: Diagnosis not present

## 2023-09-18 DIAGNOSIS — E119 Type 2 diabetes mellitus without complications: Secondary | ICD-10-CM

## 2023-09-18 DIAGNOSIS — R768 Other specified abnormal immunological findings in serum: Secondary | ICD-10-CM | POA: Diagnosis not present

## 2023-09-18 DIAGNOSIS — F321 Major depressive disorder, single episode, moderate: Secondary | ICD-10-CM | POA: Diagnosis not present

## 2023-09-18 MED ORDER — EZETIMIBE 10 MG PO TABS: 10.0000 mg | ORAL_TABLET | Freq: Every day | ORAL | 5 refills | Status: DC

## 2023-09-18 MED ORDER — SEMAGLUTIDE (1 MG/DOSE) 4 MG/3ML ~~LOC~~ SOPN: 1.0000 mg | PEN_INJECTOR | SUBCUTANEOUS | 0 refills | Status: DC

## 2023-09-18 NOTE — Progress Notes (Signed)
 +  Subjective:  Patient ID: Dana Burke, female    DOB: [birth year]  Age: 78 y.o. MRN: [id number]  CC: The primary encounter diagnosis was Hyperlipidemia with target low density lipoprotein (LDL) cholesterol less than 100 mg/dL. Diagnoses of Mild cognitive impairment with memory loss, Current moderate episode of major depressive disorder without prior episode (HCC), Diabetes mellitus treated with injections of non-insulin medication (HCC), and Elevated serum immunoglobulin free light chains were also pertinent to this visit.   HPI Dana Burke presents for  Chief Complaint  Patient presents with   Medical Management of Chronic Issues   Patient was asked to return for evaluation of memory at the request of her daughter, who sent a message to me last month  raising concern for mother's memory :  her sister and I have both noticed a gradual change in mental status, especially in her cognitive ability to remember. She is telling my aunt one thing and her story changes with me. [email or web address] have on multiple occasions addressed the issue of her moving in with us  as her hygiene has also been neglected. This is been observed first hand by her sister as she lives across the street and visits her daily.[email or web address] of concern is her ability to drive. She did have her DL renewed in March but again, there is concern from one of her brothers as well as her sister. I don't let her drive when she is here nor do I when we go visit her.  There is no history of reported MVA's .  MMSE:  she completed an MMSA with a score of 28/30  (calculation and recall of 2/3 at 5 minutes) .  Clock drawing was impaired.    2) T2DM:  She  feels generally well,  But is not  exercising regularly . Checking  blood sugars less than once daily at variable times, usually only if she feels she may be having a hypoglycemic event. .  BS have been under 130 fasting and < 150 post prandially.  Denies any recent hypoglyemic events.  Taking    medications as directed. Following a carbohydrate modified diet 6 days per week. Denies numbness, burning and tingling of extremities. Appetite is diminished      Outpatient Medications Prior to Visit  Medication Sig Dispense Refill   acetaminophen  (TYLENOL ) 325 MG tablet Take by mouth.     albuterol  (VENTOLIN  HFA) 108 (90 Base) MCG/ACT inhaler Inhale into the lungs every 6 (six) hours as needed for wheezing or shortness of breath.     Blood Glucose Monitoring Suppl (ONETOUCH VERIO) w/Device KIT Use as instructed to check blood glucose once daily as needed. 1 kit 0   buPROPion  (WELLBUTRIN  XL) 300 MG 24 hr tablet Take 1 tablet (300 mg total) by mouth daily. 90 each 2   cetirizine (ZYRTEC) 10 MG tablet Take 10 mg by mouth at bedtime. As needed     Cholecalciferol (VITAMIN D -3) 1000 UNITS CAPS Take 1 capsule by mouth daily.     Docusate Sodium  (COLACE PO) Take 1 capsule by mouth daily. (Patient taking differently: Take 1 capsule by mouth as needed.)     escitalopram  (LEXAPRO ) 10 MG tablet TAKE 1 TABLET BY MOUTH DAILY 90 tablet 3   esomeprazole  (NEXIUM ) 40 MG capsule TAKE 1 CAPSULE BY MOUTH ONCE DAILY AT 12NOON 90 capsule 3   gabapentin  (NEURONTIN ) 300 MG capsule TAKE 1 CAPSULE BY MOUTH 4 TIMES DAILY 1157.143 each 1   glucose blood test strip Use as  instructed to check blood glucose once daily as needed 50 each 12   Lancets MISC Use to check blood glucose once daily as needed 100 each 11   Magnesium  Citrate 125 MG CAPS Take 2 capsules by mouth every evening. (Patient taking differently: Take 2 capsules by mouth as needed.) 180 capsule 2   Melatonin 10 MG TABS Take 1 tablet by mouth.     Multiple Vitamins-Minerals (MULTIVITAMIN WITH MINERALS) tablet Take 1 tablet by mouth daily.     nitroGLYCERIN  (NITROSTAT ) 0.4 MG SL tablet Place 1 tablet (0.4 mg total) under the tongue every 5 (five) minutes as needed for chest pain. 50 tablet 3   ondansetron  (ZOFRAN ) 4 MG tablet Take 1 tablet (4 mg total) by mouth  every 8 (eight) hours as needed. 15 tablet 0   Semaglutide ,0.25 or 0.5MG /DOS, (OZEMPIC , 0.25 OR 0.5 MG/DOSE,) 2 MG/1.5ML SOPN Inject 0.25 mg into the skin once a week. (Patient taking differently: Inject 0.5 mg into the skin once a week.)     No facility-administered medications prior to visit.    Review of Systems;  Patient denies headache, fevers, malaise, unintentional weight loss, skin rash, eye pain, sinus congestion and sinus pain, sore throat, dysphagia,  hemoptysis , cough, dyspnea, wheezing, chest pain, palpitations, orthopnea, edema, abdominal pain, nausea, melena, diarrhea, constipation, flank pain, dysuria, hematuria, urinary  Frequency, nocturia, numbness, tingling, seizures,  Focal weakness, Loss of consciousness,  Tremor, insomnia, depression, anxiety, and suicidal ideation.      Objective:  BP 112/72   Pulse 83   Ht 5' 11 (1.803 m)   Wt 207 lb 6.4 oz (94.1 kg)   SpO2 95%   BMI 28.93 kg/m   BP Readings from Last 3 Encounters:  09/18/23 112/72  08/28/23 (!) 145/80  08/09/23 112/66    Wt Readings from Last 3 Encounters:  09/18/23 207 lb 6.4 oz (94.1 kg)  08/28/23 207 lb 12.8 oz (94.3 kg)  08/09/23 205 lb 8 oz (93.2 kg)    Physical Exam Vitals reviewed.  Constitutional:      General: She is not in acute distress.    Appearance: Normal appearance. She is well-groomed and overweight. She is not ill-appearing, toxic-appearing or diaphoretic.  HENT:     Head: Normocephalic.  Eyes:     General: No scleral icterus.       Right eye: No discharge.        Left eye: No discharge.     Conjunctiva/sclera: Conjunctivae normal.  Cardiovascular:     Rate and Rhythm: Normal rate and regular rhythm.     Heart sounds: Normal heart sounds.  Pulmonary:     Effort: Pulmonary effort is normal. No respiratory distress.     Breath sounds: Normal breath sounds.  Musculoskeletal:        General: Normal range of motion.  Skin:    General: Skin is warm and dry.  Neurological:      General: No focal deficit present.     Mental Status: She is alert and oriented to person, place, and time. Mental status is at baseline.  Psychiatric:        Mood and Affect: Mood normal.        Behavior: Behavior normal. Behavior is cooperative.        Thought Content: Thought content normal.        Judgment: Judgment normal.     Lab Results  Component Value Date   HGBA1C 5.3 08/12/2023   HGBA1C 5.5 04/22/2023  HGBA1C 6.2 01/07/2023    Lab Results  Component Value Date   CREATININE 0.96 08/12/2023   CREATININE 0.99 06/14/2023   CREATININE 1.15 (H) 06/03/2023    Lab Results  Component Value Date   WBC 6.1 06/14/2023   HGB 14.1 06/14/2023   HCT 41.9 06/14/2023   PLT 247.0 06/14/2023   GLUCOSE 78 08/12/2023   CHOL 186 08/12/2023   TRIG 123 08/12/2023   HDL 38 (L) 08/12/2023   LDLDIRECT 124 (H) 08/12/2023   LDLCALC 126 (H) 08/12/2023   ALT 13 08/12/2023   AST 15 08/12/2023   NA 141 08/12/2023   K 5.1 08/12/2023   CL 104 08/12/2023   CREATININE 0.96 08/12/2023   BUN 12 08/12/2023   CO2 24 08/12/2023   TSH 1.07 06/14/2023   HGBA1C 5.3 08/12/2023    MM 3D SCREENING MAMMOGRAM BILATERAL BREAST Result Date: 07/04/2023 CLINICAL DATA:  Screening. EXAM: DIGITAL SCREENING BILATERAL MAMMOGRAM WITH TOMOSYNTHESIS AND CAD TECHNIQUE: Bilateral screening digital craniocaudal and mediolateral oblique mammograms were obtained. Bilateral screening digital breast tomosynthesis was performed. The images were evaluated with computer-aided detection. COMPARISON:  Previous exam(s). ACR Breast Density Category b: There are scattered areas of fibroglandular density. FINDINGS: There are no findings suspicious for malignancy. IMPRESSION: No mammographic evidence of malignancy. A result letter of this screening mammogram will be mailed directly to the patient. RECOMMENDATION: Screening mammogram in one year. (Code:SM-B-01Y) BI-RADS CATEGORY  1: Negative. Electronically Signed   By: Alm Parkins M.D.   On: 07/04/2023 10:55    Assessment & Plan:  .Hyperlipidemia with target low density lipoprotein (LDL) cholesterol less than 100 mg/dL -     Comprehensive metabolic panel with GFR; Future  Mild cognitive impairment with memory loss Assessment & Plan: Deficits were noted (mild) on exam today.  Referral to Dr Maree offered and deferred.    Current moderate episode of major depressive disorder without prior episode [facility]) Assessment & Plan: Symptoms are stable  with addition of lexapro  to Wellbutrin   300 mg daily ,  and her mood is normal.  No changes today    Diabetes mellitus treated with injections of non-insulin medication (HCC) Assessment & Plan: Continue reduced dose of Ozempic    Lab Results  Component Value Date   HGBA1C 5.3 08/12/2023      Elevated serum immunoglobulin free light chains Assessment & Plan: Referred to hematology for rule out of amyloid in progress    Other orders -     Ezetimibe ; Take 1 tablet (10 mg total) by mouth daily.  Dispense: 30 tablet; Refill: 5 -     Semaglutide  (1 MG/DOSE); Inject 1 mg into the skin once a week.  Dispense: 9 mL; Refill: 0    I personally spent a total of 32 minutes in the care of the patient today including preparing to see the patient, getting/reviewing separately obtained history, performing a medically appropriate exam/evaluation, counseling and educating, placing orders, documenting clinical information in the EHR, and communicating results.  Follow-up: Return in about 3 months (around 12/18/2023) for follow up diabetes.   Verneita LITTIE Kettering, MD

## 2023-09-21 ENCOUNTER — Encounter: Payer: Self-pay | Admitting: Internal Medicine

## 2023-09-21 DIAGNOSIS — G3184 Mild cognitive impairment, so stated: Secondary | ICD-10-CM | POA: Insufficient documentation

## 2023-09-21 NOTE — Assessment & Plan Note (Signed)
 Symptoms are stable  with addition of lexapro  to Wellbutrin   300 mg daily ,  and her mood is normal.  No changes today

## 2023-09-21 NOTE — Assessment & Plan Note (Signed)
 Continue reduced dose of Ozempic    Lab Results  Component Value Date   HGBA1C 5.3 08/12/2023

## 2023-09-21 NOTE — Assessment & Plan Note (Signed)
 Deficits were noted (mild) on exam today.  Referral to Dr Maree offered and deferred.

## 2023-09-21 NOTE — Assessment & Plan Note (Signed)
 Referred to hematology for rule out of amyloid in progress

## 2023-10-16 ENCOUNTER — Other Ambulatory Visit

## 2023-10-22 ENCOUNTER — Encounter: Payer: Self-pay | Admitting: Pharmacist

## 2023-10-23 ENCOUNTER — Other Ambulatory Visit (INDEPENDENT_AMBULATORY_CARE_PROVIDER_SITE_OTHER)

## 2023-10-23 ENCOUNTER — Telehealth: Payer: Self-pay | Admitting: Internal Medicine

## 2023-10-23 DIAGNOSIS — E785 Hyperlipidemia, unspecified: Secondary | ICD-10-CM

## 2023-10-23 DIAGNOSIS — Z23 Encounter for immunization: Secondary | ICD-10-CM | POA: Diagnosis not present

## 2023-10-23 LAB — COMPREHENSIVE METABOLIC PANEL WITH GFR
ALT: 13 U/L (ref 0–35)
AST: 13 U/L (ref 0–37)
Albumin: 4.1 g/dL (ref 3.5–5.2)
Alkaline Phosphatase: 67 U/L (ref 39–117)
BUN: 13 mg/dL (ref 6–23)
CO2: 29 meq/L (ref 19–32)
Calcium: 9.8 mg/dL (ref 8.4–10.5)
Chloride: 106 meq/L (ref 96–112)
Creatinine, Ser: 0.87 mg/dL (ref 0.40–1.20)
GFR: 62.87 mL/min (ref >60.00)
Glucose, Bld: 97 mg/dL (ref 70–99)
Potassium: 4.6 meq/L (ref 3.5–5.1)
Sodium: 140 meq/L (ref 135–145)
Total Bilirubin: 0.5 mg/dL (ref 0.2–1.2)
Total Protein: 6.8 g/dL (ref 6.0–8.3)

## 2023-10-23 NOTE — Telephone Encounter (Signed)
 Pt was in this morning and asked about a referral to Dr Maree that was discussed at her last visit. Please reach out to pt with any information. -kh

## 2023-10-23 NOTE — Telephone Encounter (Signed)
 I do not see a referral placed to Dr. Maree

## 2023-10-24 NOTE — Telephone Encounter (Signed)
 Spoke with pt about the referral and asked pt if she would like to go forward with the referral. Pt stated that she would still like to hold off on it for right now.

## 2023-10-25 ENCOUNTER — Ambulatory Visit: Payer: Self-pay | Admitting: Internal Medicine

## 2023-11-02 IMAGING — MR MR ABDOMEN WO/W CM MRCP
19 of 21 series · 44 of 48 positions shown · IV contrast (10ml Gadavist)
Comparison: CT abdomen and pelvis 01/23/2021

CLINICAL DATA: Pancreatic lesion

EXAM:
MRI ABDOMEN WITHOUT AND WITH CONTRAST (INCLUDING MRCP)
TECHNIQUE: Multiplanar multisequence MR imaging of the abdomen was performed
both before and after the administration of intravenous contrast.
Heavily T2-weighted images of the biliary and pancreatic ducts were
obtained, and three-dimensional MRCP images were rendered by post
processing.
CONTRAST:  10mL GADAVIST GADOBUTROL 1 MMOL/ML IV SOLN

[Series 3: T2 · coronal · 6.5mm · 1.19mm/px · 1 of 30 slices shown (1 of 2)]
[im 1/30]
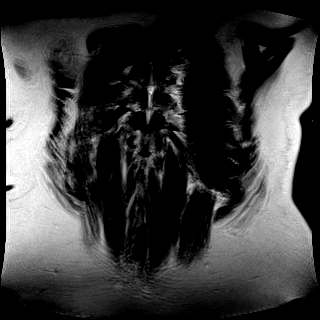

[Series 4: T2 · axial · 6.5mm · 1.19mm/px · 1 of 34 slices shown (2 of 2)]
[im 1/34]
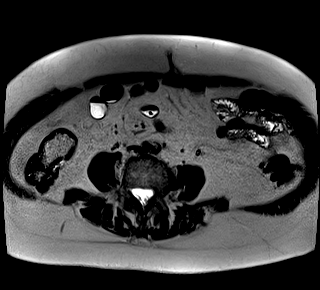

[Series 5: T1 · axial · 3.0mm · 1.19mm/px · z∈[-71,+166]mm · 2 of 80 slices shown (1 of 2)]
[im 1/80]
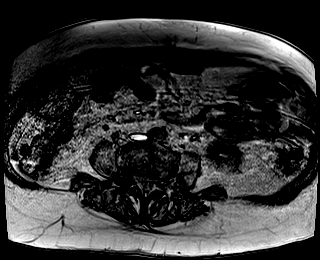
[im 80/80]
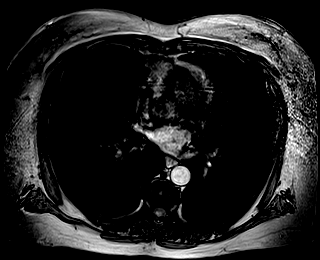

[Series 6: T1 · axial · 3.0mm · 1.19mm/px · z∈[-71,+166]mm · 2 of 80 slices shown (2 of 2)]
[im 1/80]
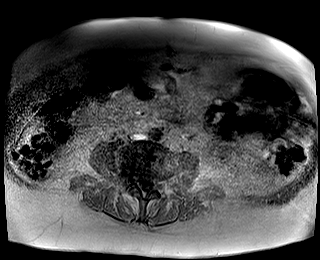
[im 80/80]
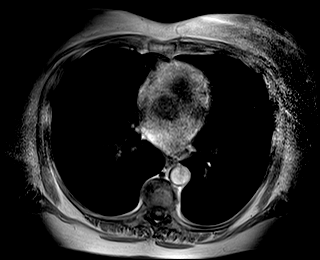

[Series 9: T2 fat-sat · axial · 6.5mm · 1.19mm/px · 1 of 34 slices shown]
[im 1/34]
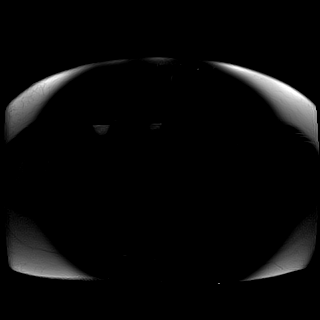

[Series 10: ax dwi_tracew · axial · 6.5mm · 1.42mm/px · z∈[-81,+176]mm · 4 of 102 slices shown]
[im 1/102]
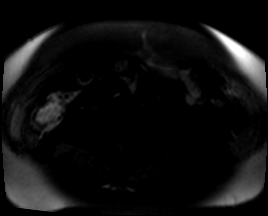
[im 34/102]
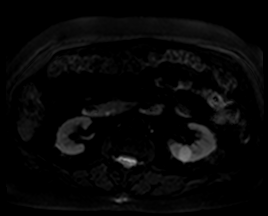
[im 68/102]
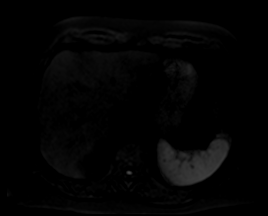
[im 102/102]
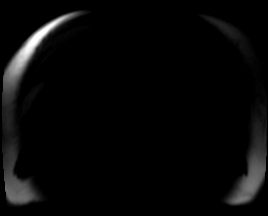

[Series 11: ax dwi_adc · axial · 6.5mm · 1.42mm/px · 1 of 34 slices shown]
[im 1/34]
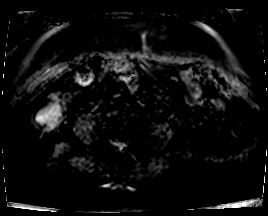

[Series 15: MRCP · coronal · 3.0mm · 1.12mm/px · 1 of 18 slices shown]
[im 1/18]
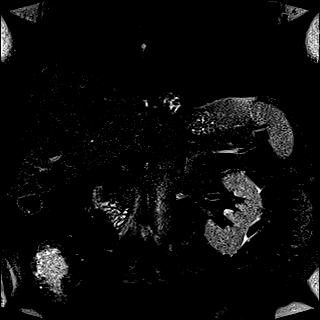

[Series 16: radials · coronal · 50.0mm · 0.78mm/px · 1 of 5 slices shown]
[im 1/5]
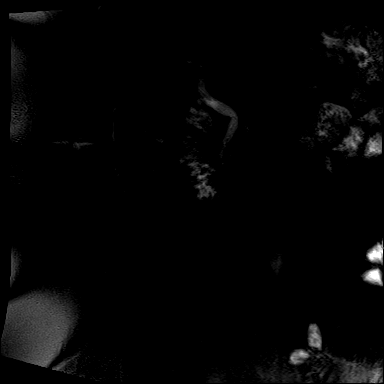

[Series 17: T1 dynamic fat-sat · axial · non-contrast · 3.0mm · 1.19mm/px · z∈[-65,+172]mm · 3 of 80 slices shown (1 of 5)]
[im 1/80]
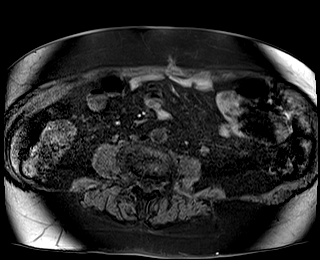
[im 40/80]
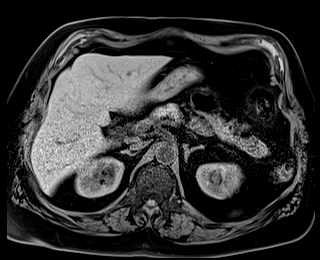
[im 80/80]
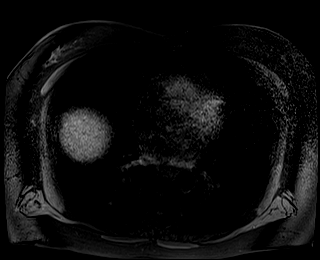

[Series 18: T1 dynamic fat-sat post-contrast · axial · 3.0mm · 1.19mm/px · z∈[-65,+172]mm · 3 of 80 slices shown (1 of 4)]
[im 1/80]
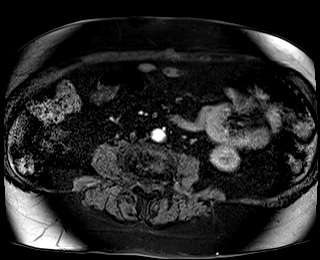
[im 40/80]
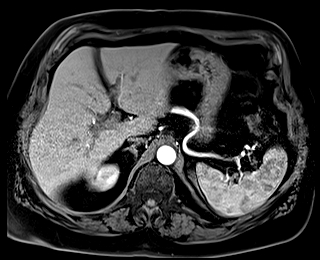
[im 80/80]
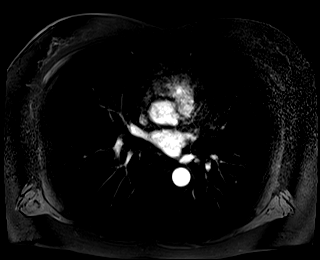

[Series 19: T1 dynamic fat-sat · axial · 3.0mm · 1.19mm/px · z∈[-65,+172]mm · 3 of 80 slices shown (2 of 5)]
[im 1/80]
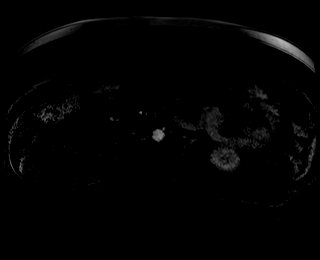
[im 40/80]
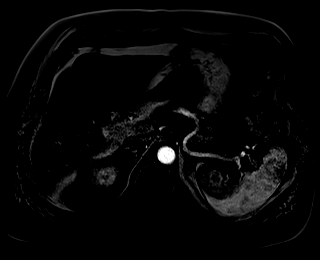
[im 80/80]
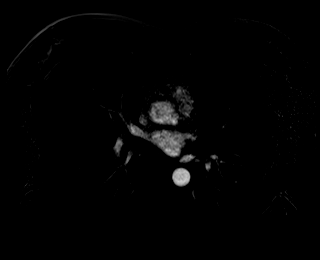

[Series 20: T1 dynamic fat-sat post-contrast · axial · 3.0mm · 1.19mm/px · z∈[-65,+172]mm · 3 of 80 slices shown (2 of 4)]
[im 1/80]
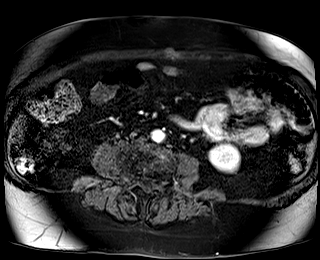
[im 40/80]
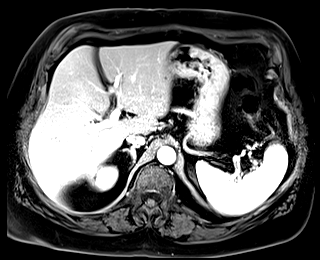
[im 80/80]
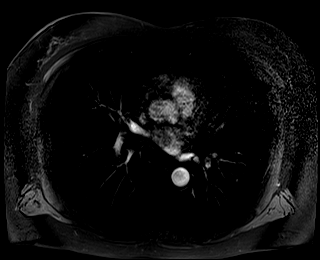

[Series 21: T1 dynamic fat-sat · axial · 3.0mm · 1.19mm/px · z∈[-65,+172]mm · 3 of 80 slices shown (3 of 5)]
[im 1/80]
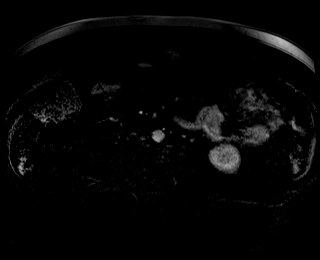
[im 40/80]
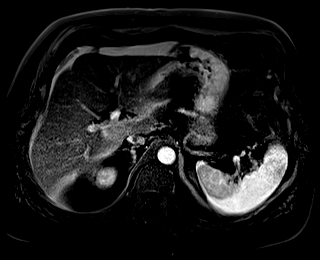
[im 80/80]
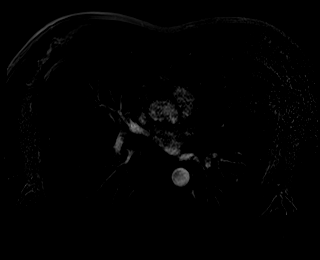

[Series 22: T1 dynamic fat-sat post-contrast · axial · 3.0mm · 1.19mm/px · z∈[-65,+172]mm · 3 of 80 slices shown (3 of 4)]
[im 1/80]
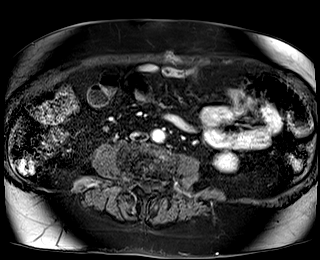
[im 40/80]
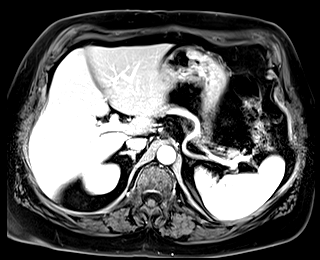
[im 80/80]
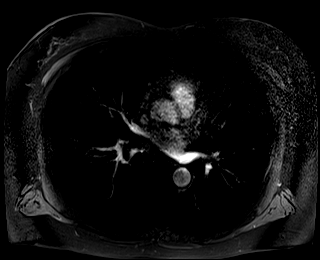

[Series 23: T1 dynamic fat-sat · axial · 3.0mm · 1.19mm/px · z∈[-65,+172]mm · 3 of 80 slices shown (4 of 5)]
[im 1/80]
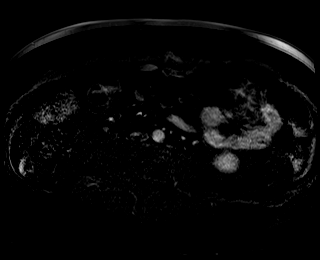
[im 40/80]
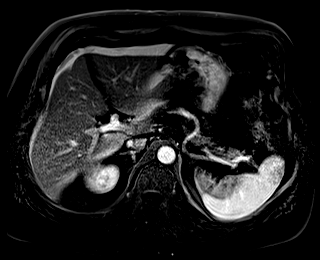
[im 80/80]
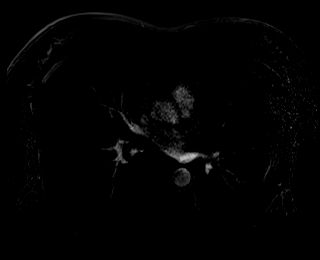

[Series 24: T1 dynamic post-contrast · coronal · 3.0mm · 1.31mm/px · 3 of 80 slices shown]
[im 1/80]
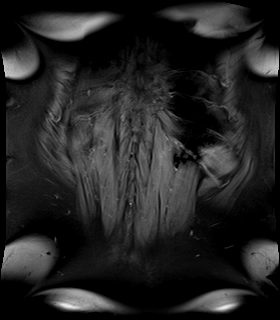
[im 40/80]
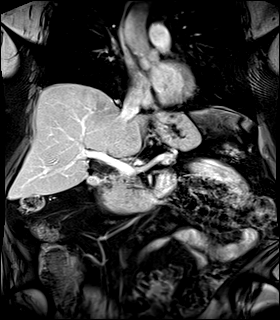
[im 80/80]
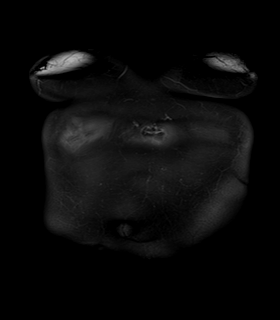

[Series 25: T1 dynamic fat-sat post-contrast · axial · 3.0mm · 1.19mm/px · z∈[-65,+172]mm · 3 of 80 slices shown (4 of 4)]
[im 1/80]
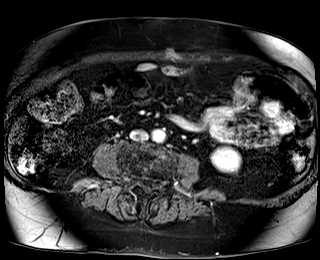
[im 40/80]
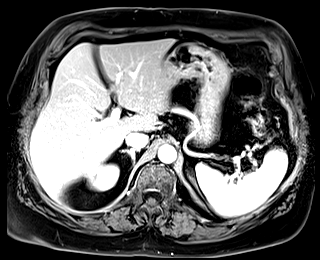
[im 80/80]
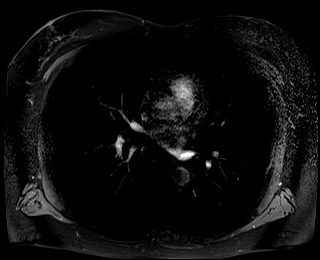

[Series 26: T1 dynamic fat-sat · axial · 3.0mm · 1.19mm/px · z∈[-65,+172]mm · 3 of 80 slices shown (5 of 5)]
[im 1/80]
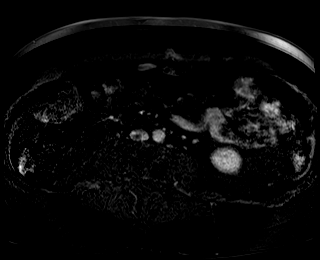
[im 40/80]
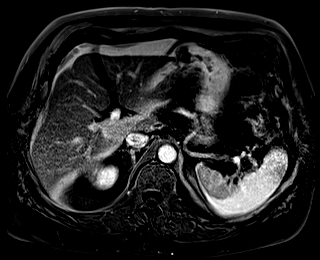
[im 80/80]
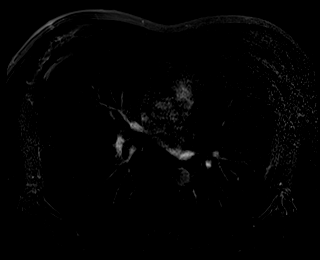

[44 of 48 positions shown; findings below may reference images not displayed]

FINDINGS: Lower chest: No acute findings.

Hepatobiliary: Liver is normal in size and contour with no
suspicious mass identified. Evidence of hepatic steatosis.
Gallbladder is surgically absent. No biliary ductal dilatation
identified.

Pancreas: 5 mm hyperintense T2 signal lesion in the body/tail the
pancreas, without definite communication with the pancreatic duct or
evidence of enhancement. There is slight relative prominence of the
distal tail the pancreas which measures up to 1.4 cm in AP
thickness, with no defined mass or altered signal/enhancement
visualized. Pancreatic duct is normal caliber.

Spleen:  Within normal limits in size and appearance.

Adrenals/Urinary Tract: Adrenal glands appear normal. A few left
renal cysts measuring up to 2 cm in size. No enhancing renal mass or
hydronephrosis identified bilaterally.

Stomach/Bowel: Small hiatal hernia. No bowel obstruction identified.
Extensive colonic diverticulosis.

Vascular/Lymphatic: No pathologically enlarged lymph nodes
identified. No abdominal aortic aneurysm demonstrated.

Other:  No ascites.

Musculoskeletal: No suspicious bone lesions identified.
IMPRESSION: 1. Slight relative thickening/prominence of the distal tail the
pancreas with no defined mass appreciated. Consider follow-up CT or
MRI with contrast in 3 months to ensure stability.
2. 5 mm cystic lesion in the body/tail the pancreas. Recommend
follow up pre and post contrast MRI/MRCP or pancreatic protocol CT
in 2 years. This recommendation follows ACR consensus guidelines:
Management of Incidental Pancreatic Cysts: A White Paper of the ACR
Incidental Findings Committee. [HOSPITAL] 0883;[DATE].
3. Hepatic steatosis.
4. Small hiatal hernia.
5. Extensive colonic diverticulosis.

## 2023-11-05 ENCOUNTER — Encounter: Payer: Self-pay | Admitting: Pharmacist

## 2023-11-05 NOTE — Progress Notes (Signed)
 Mychart message sent to patient on 10/22/23 regarding changes to [employer] Patient Assistance Program in 2026. [facility] will no longer be offering Ozempic  or Rybelsus  to Medicare patients in 2026.   Mychart message not read as of 11/05/23. Letter sent to patient via mail.

## 2023-11-08 IMAGING — CT CT CHEST W/O CM
2 of 4 series · 15 of 36 positions shown, 18 images · non-contrast
Comparison: CT January 23, 2021

CLINICAL DATA: Follow-up lung nodules.



[Series 2: thorax · axial · 0.68mm/px · z∈[-576,-312]mm · 12 of 157 slices shown, 15 images]
[im 13/157  mediastinal]
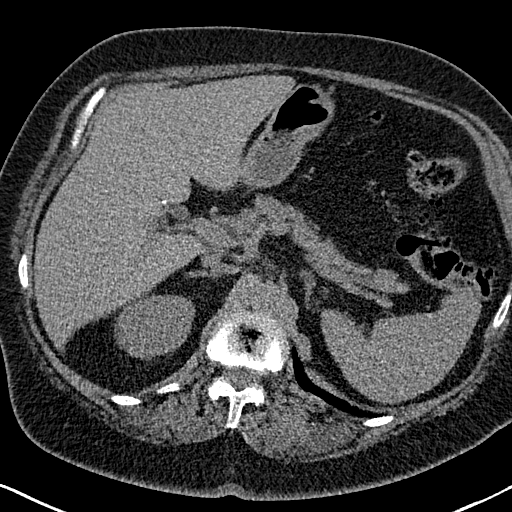
[im 13/157  lung]
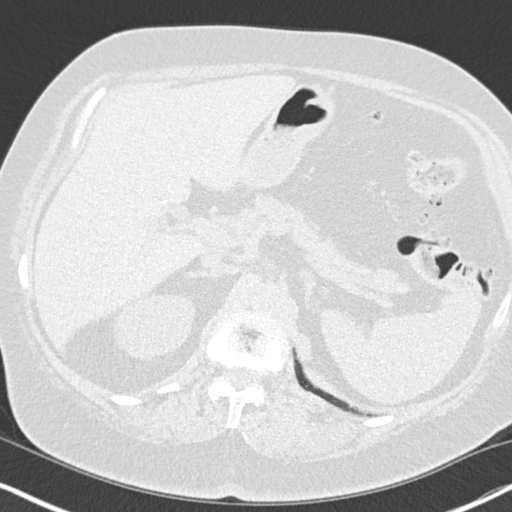
[im 25/157  lung]
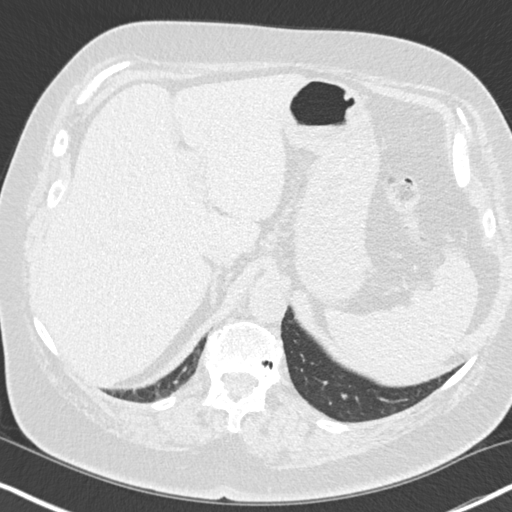
[im 37/157  lung]
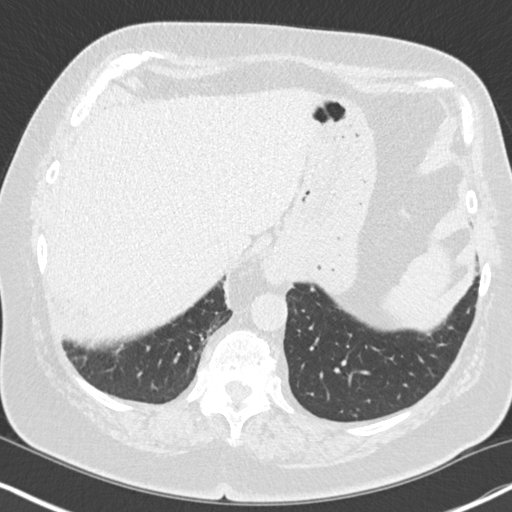
[im 49/157  lung]
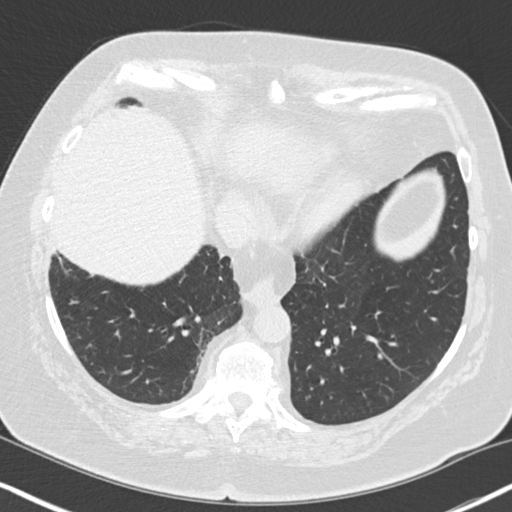
[im 61/157  mediastinal]
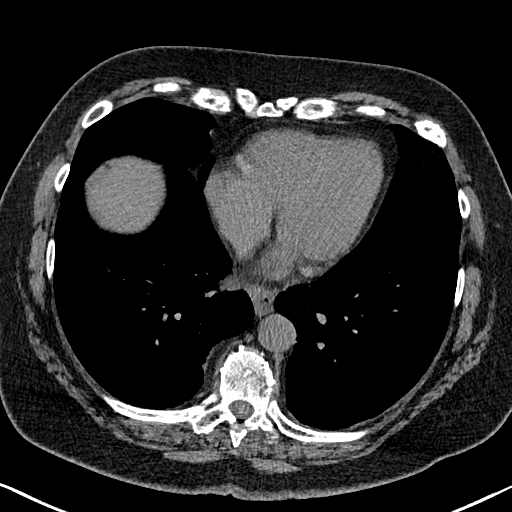
[im 61/157  lung]
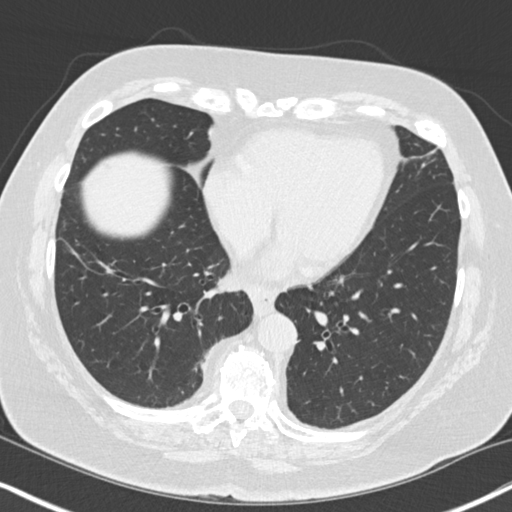
[im 73/157  lung]
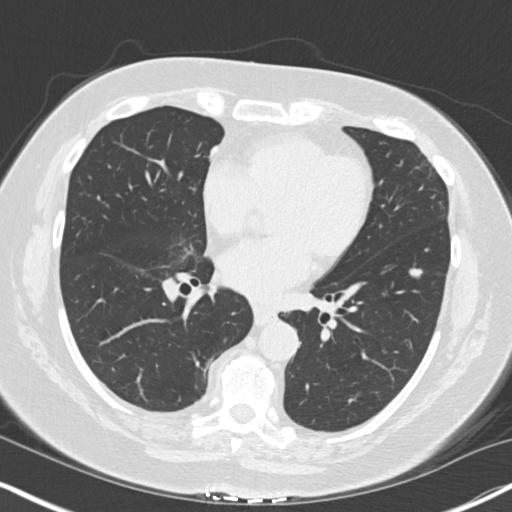
[im 85/157  lung]
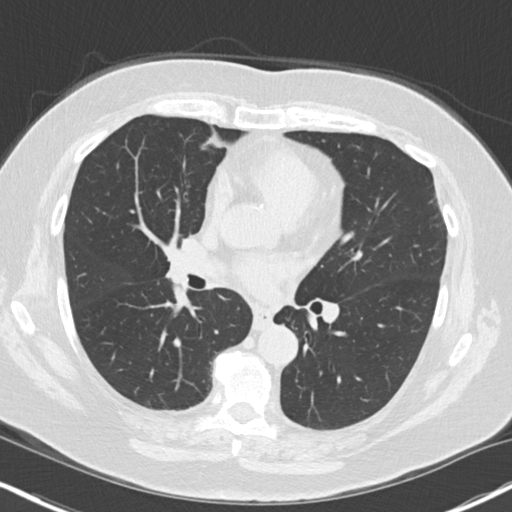
[im 97/157  lung]
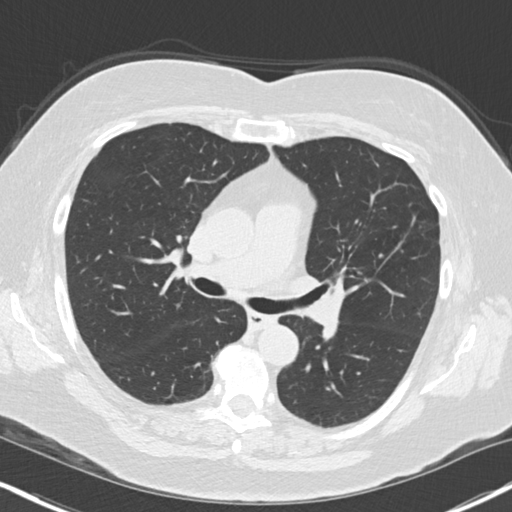
[im 109/157  mediastinal]
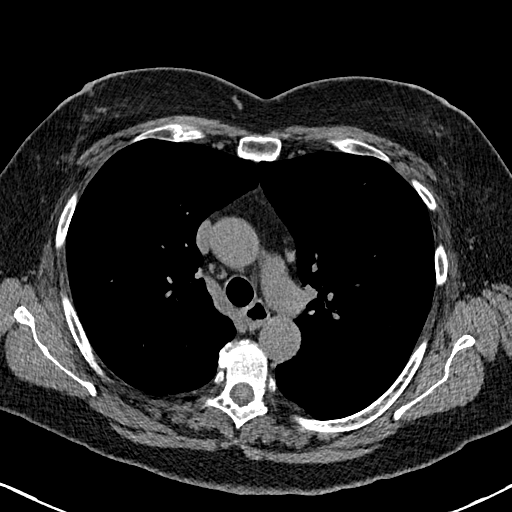
[im 109/157  lung]
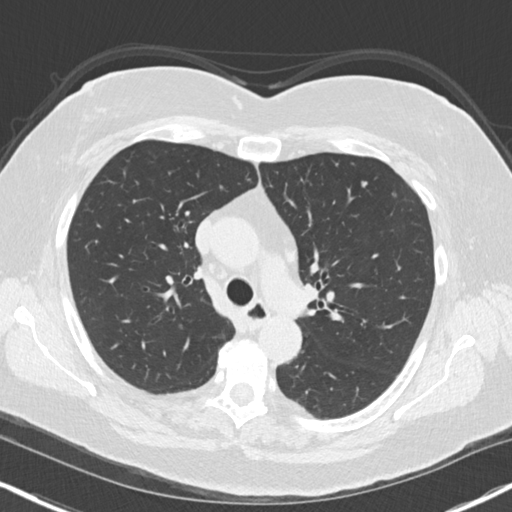
[im 121/157  lung]
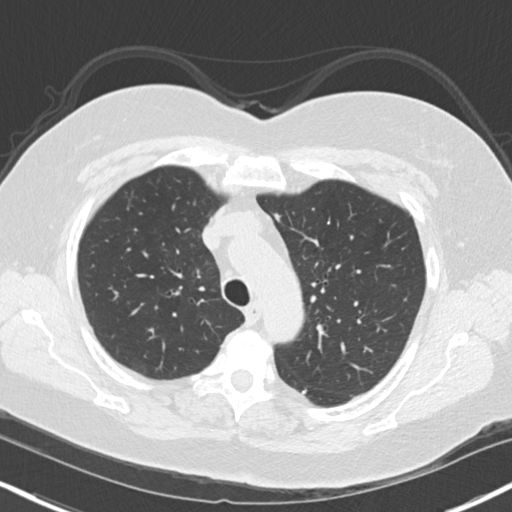
[im 133/157  lung]
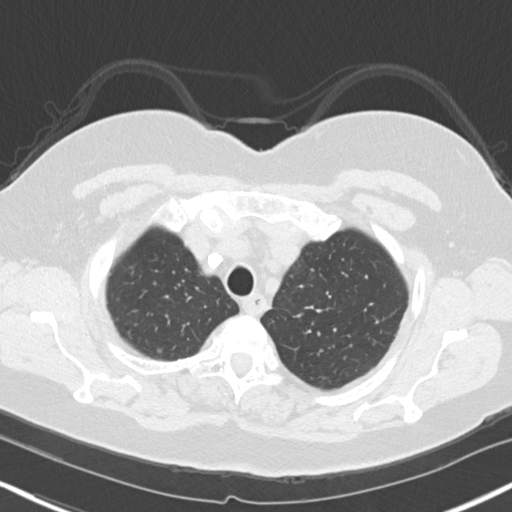
[im 145/157  lung]
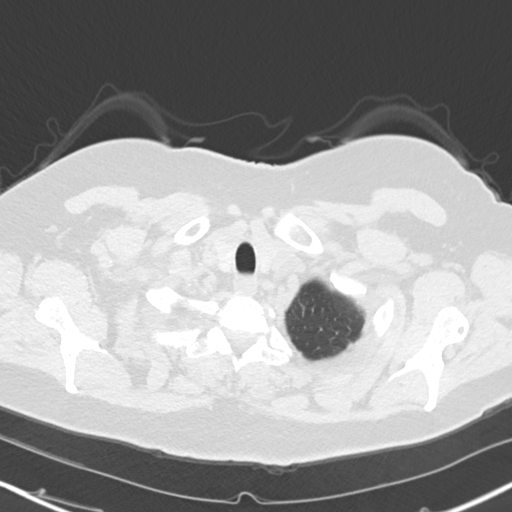

[Series 5: coronal · coronal · 0.62mm/px · 3 of 154 slices shown]
[im 31/154  lung]
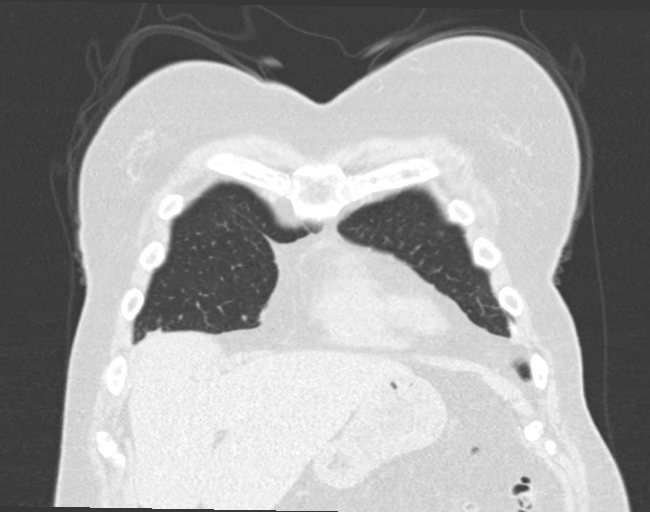
[im 62/154  lung]
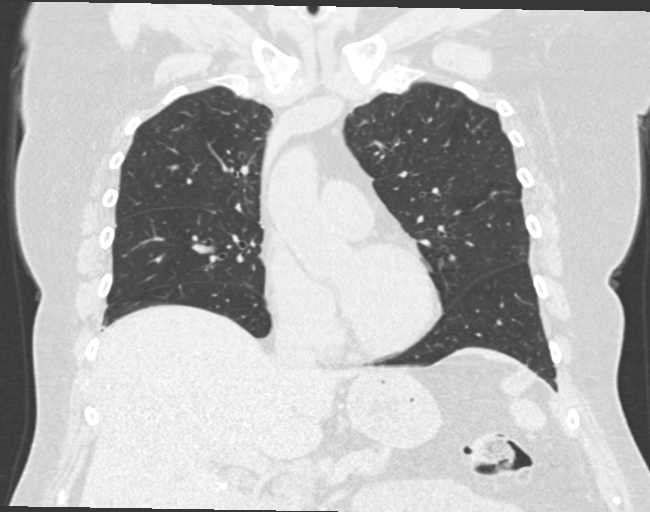
[im 92/154  lung]
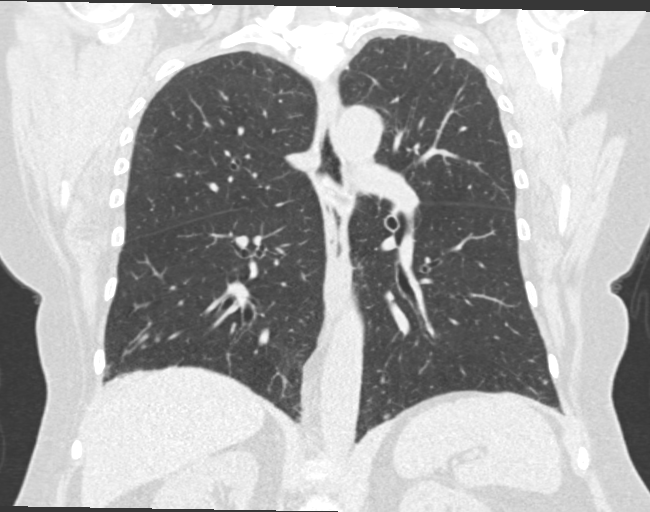

[15 of 36 positions shown; findings below may reference images not displayed]

FINDINGS: Cardiovascular: Aortic atherosclerosis without aneurysmal dilation.
Normal size heart. No significant pericardial effusion/thickening.

Mediastinum/Nodes: No discrete thyroid nodule. No pathologically
enlarged mediastinal, hilar or axillary lymph nodes, noting limited
sensitivity for the detection of hilar adenopathy on this
noncontrast study. Small hiatal hernia.

Lungs/Pleura: Scattered tiny right-sided pulmonary nodules which
measure up to 4 mm in the right upper lobe on image on image 46/3
with an additional index nodule measuring 3 mm in the anterior right
lower lobe on image 92/3.

Mild diffuse bronchial wall thickening. Scattered areas of scarring
versus atelectasis. Mild biapical pleuroparenchymal scarring. No
pleural effusion. No pneumothorax.

Upper Abdomen: Gallbladder surgically absent.  No acute abnormality.

Musculoskeletal: Multilevel degenerative changes spine. No
aggressive lytic or blastic lesion of bone.
IMPRESSION: 1. Scattered tiny right-sided pulmonary nodules measuring up to 4 mm
in the right lung which are technically nonspecific but favored
infectious or inflammatory. However, in the setting of malignancy
metastatic disease is not entirely excluded on this examination.
Recommend follow-up dedicated chest CT in 3 months to assess
stability.
2. Mild diffuse bronchial wall thickening suggesting bronchitis.
3. Small hiatal hernia.
4. Aortic Atherosclerosis ([id number]).

## 2023-11-19 ENCOUNTER — Telehealth: Payer: Self-pay

## 2023-11-19 NOTE — Telephone Encounter (Signed)
 Lvm notifing pt meds is ready for pick up.  Ozempic  4mg /32ml  Lot: [id number] Exp: 12/14/25  4 boxes

## 2023-11-20 NOTE — Telephone Encounter (Signed)
Patient picked up medication today.

## 2023-11-27 NOTE — Telephone Encounter (Signed)
 Copied from CRM [id number]. Topic: Appointments - Appointment Scheduling >> Nov 26, 2023  4:38 PM Lonell PEDLAR wrote: Patient/patient representative is calling to schedule an appointment. Patient is unable to schedule, as she will be in [place]  mid January for an unknown period of time. Please call pt to best advise. C/b [phone]  I spoke with patient and rescheduled her AWV - new date is 03/19/2023.

## 2023-12-11 ENCOUNTER — Encounter

## 2023-12-16 ENCOUNTER — Ambulatory Visit: Payer: Medicare Other | Admitting: Dermatology

## 2023-12-16 DIAGNOSIS — D235 Other benign neoplasm of skin of trunk: Secondary | ICD-10-CM | POA: Diagnosis not present

## 2023-12-16 DIAGNOSIS — Z1283 Encounter for screening for malignant neoplasm of skin: Secondary | ICD-10-CM | POA: Diagnosis not present

## 2023-12-16 DIAGNOSIS — D1801 Hemangioma of skin and subcutaneous tissue: Secondary | ICD-10-CM

## 2023-12-16 DIAGNOSIS — L570 Actinic keratosis: Secondary | ICD-10-CM | POA: Diagnosis not present

## 2023-12-16 DIAGNOSIS — Z808 Family history of malignant neoplasm of other organs or systems: Secondary | ICD-10-CM | POA: Diagnosis not present

## 2023-12-16 DIAGNOSIS — S0081XA Abrasion of other part of head, initial encounter: Secondary | ICD-10-CM

## 2023-12-16 DIAGNOSIS — L821 Other seborrheic keratosis: Secondary | ICD-10-CM

## 2023-12-16 DIAGNOSIS — L820 Inflamed seborrheic keratosis: Secondary | ICD-10-CM | POA: Diagnosis not present

## 2023-12-16 DIAGNOSIS — L578 Other skin changes due to chronic exposure to nonionizing radiation: Secondary | ICD-10-CM | POA: Diagnosis not present

## 2023-12-16 DIAGNOSIS — W908XXA Exposure to other nonionizing radiation, initial encounter: Secondary | ICD-10-CM

## 2023-12-16 DIAGNOSIS — L739 Follicular disorder, unspecified: Secondary | ICD-10-CM | POA: Diagnosis not present

## 2023-12-16 DIAGNOSIS — L814 Other melanin hyperpigmentation: Secondary | ICD-10-CM | POA: Diagnosis not present

## 2023-12-16 DIAGNOSIS — D229 Melanocytic nevi, unspecified: Secondary | ICD-10-CM

## 2023-12-16 NOTE — Progress Notes (Signed)
 Follow-Up Visit   Subjective  Dana Burke is a 78 y.o. female who presents for the following: Skin Cancer Screening and Full Body Skin Exam  The patient presents for Total-Body Skin Exam (TBSE) for skin cancer screening and mole check. The patient has spots, moles and lesions to be evaluated, some may be new or changing. 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 She notes that area is scaly off and on and is tender.     The following portions of the chart were reviewed this encounter and updated as appropriate: medications, allergies, medical history  Review of Systems:  No other skin or systemic complaints except as noted in HPI or Assessment and Plan.  Objective  Well appearing patient in no apparent distress; mood and affect are within normal limits.  A full examination was performed including scalp, head, eyes, ears, nose, lips, neck, chest, axillae, abdomen, back, buttocks, bilateral upper extremities, bilateral lower extremities, hands, feet, fingers, toes, fingernails, and toenails. All findings within normal limits unless otherwise noted below.   Relevant physical exam findings are noted in the Assessment and Plan.  R mid ear helix Pink scaly macule.  L ant shoulder x 1, R medial breast x 1, L malar cheek x 1 (3) Erythematous stuck-on, waxy papule  Assessment & Plan   SKIN CANCER SCREENING PERFORMED TODAY.  ACTINIC DAMAGE - Chronic condition, secondary to cumulative UV/sun exposure - diffuse scaly erythematous macules with underlying dyspigmentation - Recommend daily broad spectrum sunscreen SPF 30+ to sun-exposed areas, reapply every 2 hours as needed.  - Staying in the shade or wearing long sleeves, sun glasses (UVA+UVB protection) and wide brim hats (4-inch brim around the entire circumference of the hat) are also recommended for sun protection.  - Call for new or changing lesions.  LENTIGINES, SEBORRHEIC KERATOSES, HEMANGIOMAS -  Benign normal skin lesions - Benign-appearing - Call for any changes  MELANOCYTIC NEVI - Tan-brown and/or pink-flesh-colored symmetric macules and papules - Benign appearing on exam today - Observation - Call clinic for new or changing moles - Recommend daily use of broad spectrum spf 30+ sunscreen to sun-exposed areas.   FAMILY HISTORY OF SKIN CANCER What type(s): melanoma Who affected: brother, sister  DILATED PORE Exam: Dilated pore at mid back  Benign, observe. Discussed punch excision if irritated   EXCORIATION Exam: Excoriation at right temple.  Treatment Plan: Recommend vaseline. Call if not resolving.  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  Actinic keratoses are precancerous spots that appear secondary to cumulative UV radiation exposure/sun exposure over time. They are chronic with expected duration over 1 year. A portion of actinic keratoses will progress to squamous cell carcinoma of the skin. It is not possible to reliably predict which spots will progress to skin cancer and so treatment is recommended to prevent development of skin cancer.  Recommend daily broad spectrum sunscreen SPF 30+ to sun-exposed areas, reapply every 2 hours as needed.  Recommend staying in the shade or wearing long sleeves, sun glasses (UVA+UVB protection) and wide brim hats (4-inch brim around the entire circumference of the hat). Call for new or changing lesions. Destruction of lesion - R mid ear helix  Destruction method: cryotherapy   Informed consent: discussed and consent obtained   Lesion destroyed using liquid nitrogen: Yes   Region frozen until ice ball extended beyond lesion: Yes   Outcome: patient tolerated procedure well with no complications   Post-procedure  details: wound care instructions given   Additional details:  Prior to procedure, discussed risks of blister formation, small wound, skin dyspigmentation, or rare scar following cryotherapy.  Recommend Vaseline ointment to treated areas while healing.   INFLAMED SEBORRHEIC KERATOSIS (3) L ant shoulder x 1, R medial breast x 1, L malar cheek x 1 (3) Symptomatic, irritating, patient would like treated. Destruction of lesion - L ant shoulder x 1, R medial breast x 1, L malar cheek x 1 (3)  Destruction method: cryotherapy   Informed consent: discussed and consent obtained   Lesion destroyed using liquid nitrogen: Yes   Region frozen until ice ball extended beyond lesion: Yes   Outcome: patient tolerated procedure well with no complications   Post-procedure details: wound care instructions given   Additional details:  Prior to procedure, discussed risks of blister formation, small wound, skin dyspigmentation, or rare scar following cryotherapy. Recommend Vaseline ointment to treated areas while healing.   Return in about 1 year (around 12/15/2024) for TBSE, Hx AKs.  IAndrea Kerns, CMA, am acting as scribe for Rexene Rattler, MD .   Documentation: I have reviewed the above documentation for accuracy and completeness, and I agree with the above.  Rexene Rattler, MD

## 2023-12-16 NOTE — Patient Instructions (Addendum)

## 2023-12-18 ENCOUNTER — Encounter: Payer: Self-pay | Admitting: Internal Medicine

## 2023-12-18 ENCOUNTER — Ambulatory Visit: Admitting: Internal Medicine

## 2023-12-18 VITALS — BP 116/60 | HR 86 | Ht 71.0 in | Wt 205.6 lb

## 2023-12-18 DIAGNOSIS — F321 Major depressive disorder, single episode, moderate: Secondary | ICD-10-CM

## 2023-12-18 DIAGNOSIS — E119 Type 2 diabetes mellitus without complications: Secondary | ICD-10-CM | POA: Diagnosis not present

## 2023-12-18 DIAGNOSIS — R7689 Other specified abnormal immunological findings in serum: Secondary | ICD-10-CM | POA: Diagnosis not present

## 2023-12-18 DIAGNOSIS — Z7985 Long-term (current) use of injectable non-insulin antidiabetic drugs: Secondary | ICD-10-CM | POA: Diagnosis not present

## 2023-12-18 DIAGNOSIS — E785 Hyperlipidemia, unspecified: Secondary | ICD-10-CM

## 2023-12-18 LAB — LIPID PANEL
Cholesterol: 164 mg/dL (ref 0–200)
HDL: 38.5 mg/dL — ABNORMAL LOW (ref >39.00)
LDL Cholesterol: 106 mg/dL — ABNORMAL HIGH (ref 0–99)
NonHDL: 125.56
Total CHOL/HDL Ratio: 4
Triglycerides: 99 mg/dL (ref 0.0–149.0)
VLDL: 19.8 mg/dL (ref 0.0–40.0)

## 2023-12-18 LAB — COMPREHENSIVE METABOLIC PANEL WITH GFR
ALT: 12 U/L (ref 0–35)
AST: 15 U/L (ref 0–37)
Albumin: 4.1 g/dL (ref 3.5–5.2)
Alkaline Phosphatase: 71 U/L (ref 39–117)
BUN: 14 mg/dL (ref 6–23)
CO2: 26 meq/L (ref 19–32)
Calcium: 10.4 mg/dL (ref 8.4–10.5)
Chloride: 107 meq/L (ref 96–112)
Creatinine, Ser: 0.91 mg/dL (ref 0.40–1.20)
GFR: 59.5 mL/min — ABNORMAL LOW (ref >60.00)
Glucose, Bld: 78 mg/dL (ref 70–99)
Potassium: 4.5 meq/L (ref 3.5–5.1)
Sodium: 142 meq/L (ref 135–145)
Total Bilirubin: 0.5 mg/dL (ref 0.2–1.2)
Total Protein: 6.7 g/dL (ref 6.0–8.3)

## 2023-12-18 LAB — LDL CHOLESTEROL, DIRECT: Direct LDL: 119 mg/dL

## 2023-12-18 LAB — HEMOGLOBIN A1C: Hgb A1c MFr Bld: 5.3 % (ref 4.6–6.5)

## 2023-12-18 MED ORDER — LANCETS MISC: 11 refills | Status: AC

## 2023-12-18 MED ORDER — BUPROPION HCL ER (XL) 300 MG PO TB24: 300.0000 mg | ORAL_TABLET | Freq: Every day | ORAL | 2 refills | Status: AC

## 2023-12-18 MED ORDER — GABAPENTIN 300 MG PO CAPS: ORAL_CAPSULE | ORAL | 1 refills | Status: AC

## 2023-12-18 MED ORDER — SEMAGLUTIDE (2 MG/DOSE) 8 MG/3ML ~~LOC~~ SOPN: 2.0000 mg | PEN_INJECTOR | SUBCUTANEOUS | 0 refills | Status: DC

## 2023-12-18 MED ORDER — GABAPENTIN 300 MG PO CAPS: ORAL_CAPSULE | ORAL | 1 refills | Status: DC

## 2023-12-18 MED ORDER — EZETIMIBE 10 MG PO TABS: 10.0000 mg | ORAL_TABLET | Freq: Every day | ORAL | 1 refills | Status: AC

## 2023-12-18 MED ORDER — ESCITALOPRAM OXALATE 10 MG PO TABS: 10.0000 mg | ORAL_TABLET | Freq: Every day | ORAL | 3 refills | Status: AC

## 2023-12-18 MED ORDER — GLUCOSE BLOOD VI STRP: ORAL_STRIP | 12 refills | Status: AC

## 2023-12-18 MED ORDER — ESOMEPRAZOLE MAGNESIUM 40 MG PO CPDR: DELAYED_RELEASE_CAPSULE | ORAL | 3 refills | Status: AC

## 2023-12-18 NOTE — Patient Instructions (Addendum)
 There is no more ozempic  coming from [facility].  I recommend that you Contact [facility] in [place] for their  reconstituted semaglutide      it is being offered for $200 for  a  7.5 mg vial   They may require the prescription to come from them first.  Tell them you have been taking ozempic  1 mg weekly

## 2023-12-18 NOTE — Progress Notes (Unsigned)
 Subjective:  Patient ID: Dana Burke, female    DOB: Sep 05, 1945  Age: 78 y.o. MRN: [id number]  CC: The primary encounter diagnosis was Hyperlipidemia with target low density lipoprotein (LDL) cholesterol less than 100 mg/dL. Diagnoses of Diabetes mellitus type II, non insulin dependent (HCC), Current moderate episode of major depressive disorder without prior episode (HCC), Diabetes mellitus treated with injections of non-insulin medication (HCC), Elevated serum immunoglobulin free light chains, and Hypercalcemia were also pertinent to this visit.   HPI Kyanna Pietra Zuluaga presents for  Chief Complaint  Patient presents with   Medical Management of Chronic Issues   1) TYPE 2 DM/obesity/HTN:  she  feels generally well, is walking only once a week currently and checking blood sugars once daily at variable times.  BS have been under 130 fasting and < 150 post prandially.  Denies any recent hypoglyemic events.  Taking her medications as directed. Following a carbohydrate modified diet 6 days per week. Denies numbness, burning and tingling of extremities. 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   3) HEME ONC REFERRAL MADE IN SEPTEMBER FOR ABNORMAL SPEP.  Reviewed Dr Edd evaluation in August :  Mildly elevated kappa free light chain, and mildly elevated light chain ratio. This is non specific.  No M protein on SPEP.  She has no clinic signs of amyloidosis.  Recommend 24 hour UPEP.  If negative. I recommend repeat testing in 6 months.  UPEP was negative . Has follow up appt in Feb 2026   4) social:  still in negotiations with daughter who is determined to relocate patient to [place]  ; however patient's 4 children are split between [facility] and [place] and she would ideally like to split her time,  her brother lives next door and wourl beneift fro living  her in her hone should she decide to move.   Outpatient Medications Prior to Visit  Medication Sig Dispense Refill   acetaminophen  (TYLENOL ) 325 MG tablet Take by mouth.     albuterol  (VENTOLIN  HFA) 108 (90 Base) MCG/ACT inhaler Inhale into the lungs every 6 (six) hours as needed for wheezing or shortness of breath.     Blood Glucose Monitoring Suppl (ONETOUCH VERIO) w/Device KIT Use as instructed to check blood glucose once daily as needed. 1 kit 0   cetirizine (ZYRTEC) 10 MG tablet Take 10 mg by mouth at bedtime. As needed     Cholecalciferol (VITAMIN D -3) 1000 UNITS CAPS Take 1 capsule by mouth daily.     Docusate Sodium  (COLACE PO) Take 1 capsule by mouth daily. (Patient taking differently: Take 1 capsule by mouth as needed.)     Magnesium  Citrate 125 MG CAPS Take 2 capsules by mouth every evening. (Patient taking differently: Take 2 capsules by mouth as needed.) 180 capsule 2   Melatonin 10 MG TABS Take 1 tablet by mouth.     Multiple Vitamins-Minerals (MULTIVITAMIN WITH MINERALS) tablet Take 1 tablet by mouth daily.     nitroGLYCERIN  (NITROSTAT ) 0.4 MG SL tablet Place 1 tablet (0.4 mg total) under the tongue every 5 (five) minutes as needed for chest pain. 50 tablet 3   ondansetron  (ZOFRAN ) 4 MG tablet Take 1 tablet (4 mg total) by mouth every 8 (eight) hours as needed. 15 tablet 0   buPROPion  (WELLBUTRIN   XL) 300 MG 24 hr tablet Take 1 tablet (300 mg total) by mouth daily. 90 each 2   escitalopram  (LEXAPRO ) 10 MG tablet TAKE 1 TABLET BY MOUTH DAILY 90 tablet 3   esomeprazole  (NEXIUM ) 40 MG capsule TAKE 1 CAPSULE BY MOUTH ONCE DAILY AT 12NOON 90 capsule 3   ezetimibe  (ZETIA ) 10 MG tablet Take 1 tablet (10 mg total) by mouth daily. 30 tablet 5   gabapentin  (NEURONTIN ) 300 MG capsule TAKE 1 CAPSULE BY MOUTH 4 TIMES DAILY 1157.143 each 1   glucose blood test strip Use as instructed to check blood glucose once daily as needed 50 each 12   Lancets MISC Use to check blood glucose once daily as  needed 100 each 11   Semaglutide , 1 MG/DOSE, 4 MG/3ML SOPN Inject 1 mg into the skin once a week. 9 mL 0   No facility-administered medications prior to visit.    Review of Systems;  Patient denies headache, fevers, malaise, unintentional weight loss, skin rash, eye pain, sinus congestion and sinus pain, sore throat, dysphagia,  hemoptysis , cough, dyspnea, wheezing, chest pain, palpitations, orthopnea, edema, abdominal pain, nausea, melena, diarrhea, constipation, flank pain, dysuria, hematuria, urinary  Frequency, nocturia, numbness, tingling, seizures,  Focal weakness, Loss of consciousness,  Tremor, insomnia, depression, anxiety, and suicidal ideation.      Objective:  BP 116/60   Pulse 86   Ht 5' 11 (1.803 m)   Wt 205 lb 9.6 oz (93.3 kg)   SpO2 94%   BMI 28.68 kg/m   BP Readings from Last 3 Encounters:  12/18/23 116/60  09/18/23 112/72  08/28/23 (!) 145/80    Wt Readings from Last 3 Encounters:  12/18/23 205 lb 9.6 oz (93.3 kg)  09/18/23 207 lb 6.4 oz (94.1 kg)  08/28/23 207 lb 12.8 oz (94.3 kg)    Physical Exam Vitals reviewed.  Constitutional:      General: She is not in acute distress.    Appearance: Normal appearance. She is normal weight. She is not ill-appearing, toxic-appearing or diaphoretic.  HENT:     Head: Normocephalic.  Eyes:     General: No scleral icterus.       Right eye: No discharge.        Left eye: No discharge.     Conjunctiva/sclera: Conjunctivae normal.  Cardiovascular:     Rate and Rhythm: Normal rate and regular rhythm.     Heart sounds: Normal heart sounds.  Pulmonary:     Effort: Pulmonary effort is normal. No respiratory distress.     Breath sounds: Normal breath sounds.  Musculoskeletal:        General: Normal range of motion.  Skin:    General: Skin is warm and dry.  Neurological:     General: No focal deficit present.     Mental Status: She is alert and oriented to person, place, and time. Mental status is at baseline.   Psychiatric:        Mood and Affect: Mood normal.        Behavior: Behavior normal.        Thought Content: Thought content normal.        Judgment: Judgment normal.     Lab Results  Component Value Date   HGBA1C 5.3 12/18/2023   HGBA1C 5.3 08/12/2023   HGBA1C 5.5 04/22/2023    Lab Results  Component Value Date   CREATININE 0.91 12/18/2023   CREATININE 0.87 10/23/2023   CREATININE 0.96 08/12/2023    Lab  Results  Component Value Date   WBC 6.1 06/14/2023   HGB 14.1 06/14/2023   HCT 41.9 06/14/2023   PLT 247.0 06/14/2023   GLUCOSE 78 12/18/2023   CHOL 164 12/18/2023   TRIG 99.0 12/18/2023   HDL 38.50 (L) 12/18/2023   LDLDIRECT 119.0 12/18/2023   LDLCALC 106 (H) 12/18/2023   ALT 12 12/18/2023   AST 15 12/18/2023   NA 142 12/18/2023   K 4.5 12/18/2023   CL 107 12/18/2023   CREATININE 0.91 12/18/2023   BUN 14 12/18/2023   CO2 26 12/18/2023   TSH 1.07 06/14/2023   HGBA1C 5.3 12/18/2023    MM 3D SCREENING MAMMOGRAM BILATERAL BREAST Result Date: 07/04/2023 CLINICAL DATA:  Screening. EXAM: DIGITAL SCREENING BILATERAL MAMMOGRAM WITH TOMOSYNTHESIS AND CAD TECHNIQUE: Bilateral screening digital craniocaudal and mediolateral oblique mammograms were obtained. Bilateral screening digital breast tomosynthesis was performed. The images were evaluated with computer-aided detection. COMPARISON:  Previous exam(s). ACR Breast Density Category b: There are scattered areas of fibroglandular density. FINDINGS: There are no findings suspicious for malignancy. IMPRESSION: No mammographic evidence of malignancy. A result letter of this screening mammogram will be mailed directly to the patient. RECOMMENDATION: Screening mammogram in one year. (Code:SM-B-01Y) BI-RADS CATEGORY  1: Negative. Electronically Signed   By: Alm Parkins M.D.   On: 07/04/2023 10:55    Assessment & Plan:  .Hyperlipidemia with target low density lipoprotein (LDL) cholesterol less than 100 mg/dL Assessment &  Plan: Untreated due to recurrent liver enzyme elevation  with statin ,  will discuss trial of zetia  at next visit    Lab Results  Component Value Date   CHOL 164 12/18/2023   HDL 38.50 (L) 12/18/2023   LDLCALC 106 (H) 12/18/2023   LDLDIRECT 119.0 12/18/2023   TRIG 99.0 12/18/2023   CHOLHDL 4 12/18/2023     Orders: -     Lipid panel -     LDL cholesterol, direct  Diabetes mellitus type II, non insulin dependent (HCC) -     Glucose Blood; Use as instructed to check blood glucose once daily as needed  Dispense: 50 each; Refill: 12 -     Lancets; Use to check blood glucose once daily as needed  Dispense: 100 each; Refill: 11 -     Comprehensive metabolic panel with GFR -     Hemoglobin A1c  Current moderate episode of major depressive disorder without prior episode (HCC) Assessment & Plan: Symptoms are stable  with addition of lexapro  to Wellbutrin   300 mg daily ,  and her mood is normal.  No changes today    Diabetes mellitus treated with injections of non-insulin medication (HCC) Assessment & Plan: Currently well-controlled on current medications .  hemoglobin A1c is at goal of less than 7.0 . Patient is reminded to schedule an annual eye exam and foot exam is normal today. Patient has no microalbuminuria. Patient is tolerating statin therapy for CAD risk reduction and on ACE/ARB for renal protection and hypertension  Continue 1 mg weekly dose of Ozempic    Lab Results  Component Value Date   HGBA1C 5.3 12/18/2023      Elevated serum immunoglobulin free light chains Assessment & Plan: Heme onc evaluation reassuring that she has no signs of amyloidoss . 24 hr UPEP was normal ; repeat in 6 months    Hypercalcemia Assessment & Plan: PTH and calcium  are normal.   Lab Results  Component Value Date   PTH 54 08/28/2023   PTH Comment 08/28/2023   CALCIUM   10.4 12/18/2023      Other orders -     Ezetimibe ; Take 1 tablet (10 mg total) by mouth daily.  Dispense: 90  tablet; Refill: 1 -     Esomeprazole  Magnesium ; TAKE 1 CAPSULE BY MOUTH ONCE DAILY AT 12NOON  Dispense: 90 capsule; Refill: 3 -     Gabapentin ; TAKE 1 CAPSULE BY MOUTH 4 TIMES DAILY  Dispense: 360 capsule; Refill: 1 -     buPROPion  HCl ER (XL); Take 1 tablet (300 mg total) by mouth daily.  Dispense: 90 tablet; Refill: 2 -     Escitalopram  Oxalate; Take 1 tablet (10 mg total) by mouth daily.  Dispense: 90 tablet; Refill: 3   I personally spent a total of 31 minutes in the care of the patient today including getting/reviewing separately obtained history, performing a medically appropriate exam/evaluation, counseling and educating, referring and communicating with other health care professionals, documenting clinical information in the EHR, independently interpreting results, and communicating results.   Follow-up: Return in about 6 months (around 06/17/2024).   Verneita LITTIE Kettering, MD

## 2023-12-19 ENCOUNTER — Ambulatory Visit: Payer: Self-pay | Admitting: Internal Medicine

## 2023-12-19 NOTE — Assessment & Plan Note (Signed)
 Symptoms are stable  with addition of lexapro  to Wellbutrin   300 mg daily ,  and her mood is normal.  No changes today

## 2023-12-19 NOTE — Assessment & Plan Note (Addendum)
 Currently well-controlled on current medications .  hemoglobin A1c is at goal of less than 7.0 . Patient is reminded to schedule an annual eye exam and foot exam is normal today. Patient has no microalbuminuria. Patient is tolerating statin therapy for CAD risk reduction and on ACE/ARB for renal protection and hypertension  Continue 1 mg weekly dose of Ozempic    Lab Results  Component Value Date   HGBA1C 5.3 12/18/2023

## 2023-12-19 NOTE — Assessment & Plan Note (Addendum)
 Heme onc evaluation reassuring that she has no signs of amyloidoss . 24 hr UPEP was normal ; repeat in 6 months

## 2023-12-19 NOTE — Assessment & Plan Note (Signed)
 PTH and calcium  are normal.   Lab Results  Component Value Date   PTH 54 08/28/2023   PTH Comment 08/28/2023   CALCIUM  10.4 12/18/2023

## 2023-12-19 NOTE — Assessment & Plan Note (Addendum)
 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

## 2023-12-26 ENCOUNTER — Telehealth: Payer: Self-pay

## 2023-12-26 NOTE — Telephone Encounter (Signed)
 Copied from CRM #[id number]. Topic: Clinical - Prescription Issue >> Dec 26, 2023 10:19 AM Zy'onna H wrote: Reason for CRM:  Patient called in to inform her provider that [facility] has the requested medication/vial (avaliable for 2 months total)   [facility] Address: [address], [place], [place] [address] Phone: [phone]  She has requested if Marylynn Verneita CROME, MD can send the Rx order instead.  **Added to patients preferred pharmacies**

## 2023-12-26 NOTE — Telephone Encounter (Signed)
 LMTCB. Need to find out what medication pt is talking about.

## 2023-12-27 ENCOUNTER — Telehealth: Payer: Self-pay

## 2023-12-27 NOTE — Telephone Encounter (Signed)
 Copied from CRM #[id number]. Topic: General - Other >> Dec 27, 2023 10:30 AM Dana Burke wrote: Reason for CRM: Patient returning call to [facility] at clinic, Patient requesting a return call when available.

## 2023-12-30 NOTE — Telephone Encounter (Signed)
 Copied from CRM #[id number]. Topic: General - Other >> Dec 27, 2023 10:30 AM Dana Burke wrote: Reason for CRM: Patient returning call to [facility] at clinic, Patient requesting a return call when available.

## 2023-12-31 NOTE — Telephone Encounter (Signed)
 LMTCB

## 2024-01-01 NOTE — Addendum Note (Signed)
 Addended by: HARRIETTE RAISIN on: 01/01/2024 02:27 PM   Modules accepted: Orders

## 2024-01-01 NOTE — Telephone Encounter (Unsigned)
 Copied from CRM #[id number]. Topic: General - Other >> Dec 31, 2023  5:00 PM Sophia H wrote: Patient returned call again today - advised clinic does close at 5pm. Patient states she will call back in the morning.  >> Dec 30, 2023  9:43 AM Marylynn H wrote: Patient is following up on call that was never returned to her. Patient states the medication she was referring to is ozempic , I do not see it listed in her chart under current meds. Patient stated it was under a different name but Dr. Marylynn is aware of what it's called and what dose she is needing. Please call patient back today and clarify. # [phone]

## 2024-01-01 NOTE — Telephone Encounter (Signed)
 See previous telephone encounter.

## 2024-01-01 NOTE — Telephone Encounter (Unsigned)
 Copied from CRM #[id number]. Topic: General - Other >> Jan 01, 2024  8:58 AM Viola F wrote: Patient returned Jessica's phone call, says the name of the medication is the compounded Ozympic , she says to double check with Dr. Tullo

## 2024-01-01 NOTE — Telephone Encounter (Signed)
 Spoke with [facility] and the pharmacist stated that all we need to do is send in the semaglutide  rx like normal just add in the comments okay to use compounded version. I have pended the rx for approval.

## 2024-01-02 MED ORDER — SEMAGLUTIDE (1 MG/DOSE) 4 MG/3ML ~~LOC~~ SOPN: 1.0000 mg | PEN_INJECTOR | SUBCUTANEOUS | 1 refills | Status: AC

## 2024-01-02 NOTE — Addendum Note (Signed)
 Addended by: MARYLYNN VERNEITA CROME on: 01/02/2024 01:25 PM   Modules accepted: Orders

## 2024-02-10 ENCOUNTER — Ambulatory Visit: Admitting: Internal Medicine

## 2024-02-28 ENCOUNTER — Other Ambulatory Visit

## 2024-03-09 ENCOUNTER — Ambulatory Visit: Admitting: Oncology

## 2024-03-18 ENCOUNTER — Ambulatory Visit

## 2024-06-18 ENCOUNTER — Ambulatory Visit: Admitting: Internal Medicine

## 2024-12-21 ENCOUNTER — Encounter: Admitting: Dermatology
